# Patient Record
Sex: Male | Born: 1975
Health system: Southern US, Community
[De-identification: ages and names within clinical notes are randomized; demographics above are authoritative.]

## PROBLEM LIST (undated history)

## (undated) ENCOUNTER — Ambulatory Visit (HOSPITAL_COMMUNITY): Admission: EM | Payer: Commercial Managed Care - HMO | Source: Home / Self Care

## (undated) DIAGNOSIS — R7303 Prediabetes: Secondary | ICD-10-CM

## (undated) DIAGNOSIS — I10 Essential (primary) hypertension: Secondary | ICD-10-CM

## (undated) DIAGNOSIS — R079 Chest pain, unspecified: Secondary | ICD-10-CM

## (undated) HISTORY — DX: Prediabetes: R73.03

## (undated) HISTORY — DX: Chest pain, unspecified: R07.9

## (undated) HISTORY — PX: NO PAST SURGERIES: SHX2092

---

## 2012-06-30 ENCOUNTER — Emergency Department (HOSPITAL_COMMUNITY)
Admission: EM | Admit: 2012-06-30 | Discharge: 2012-06-30 | Disposition: A | Discharge status: Home / Self Care | Payer: No Typology Code available for payment source | Attending: Emergency Medicine | Admitting: Emergency Medicine

## 2012-06-30 ENCOUNTER — Emergency Department (HOSPITAL_COMMUNITY): Payer: No Typology Code available for payment source

## 2012-06-30 ENCOUNTER — Encounter (HOSPITAL_COMMUNITY): Payer: Self-pay | Admitting: *Deleted

## 2012-06-30 DIAGNOSIS — M545 Low back pain, unspecified: Secondary | ICD-10-CM | POA: Insufficient documentation

## 2012-06-30 DIAGNOSIS — S39012A Strain of muscle, fascia and tendon of lower back, initial encounter: Secondary | ICD-10-CM

## 2012-06-30 DIAGNOSIS — M542 Cervicalgia: Secondary | ICD-10-CM | POA: Insufficient documentation

## 2012-06-30 DIAGNOSIS — S161XXA Strain of muscle, fascia and tendon at neck level, initial encounter: Secondary | ICD-10-CM

## 2012-06-30 MED ORDER — IBUPROFEN 800 MG PO TABS: 800.0000 mg | ORAL_TABLET | Freq: Three times a day (TID) | ORAL | Status: AC

## 2012-06-30 MED ORDER — IBUPROFEN 800 MG PO TABS
800.0000 mg | ORAL_TABLET | Freq: Once | ORAL | Status: AC
  Administered 2012-06-30: 800 mg via ORAL
  Filled 2012-06-30: qty 1

## 2012-06-30 MED ORDER — HYDROCODONE-ACETAMINOPHEN 5-325 MG PO TABS: 1.0000 | ORAL_TABLET | Freq: Four times a day (QID) | ORAL | Status: DC | PRN

## 2012-06-30 MED ORDER — HYDROCODONE-ACETAMINOPHEN 5-325 MG PO TABS: 1.0000 | ORAL_TABLET | ORAL | Status: AC | PRN

## 2012-06-30 MED ORDER — IBUPROFEN 800 MG PO TABS: 800.0000 mg | ORAL_TABLET | Freq: Three times a day (TID) | ORAL | Status: DC | PRN

## 2012-06-30 NOTE — ED Notes (Signed)
Patient returned from X-ray, resting comfortably, NAD noted.
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
## 2012-07-01 NOTE — ED Provider Notes (Signed)
Medical screening examination/treatment/procedure(s) were performed by non-physician practitioner and as supervising physician I was immediately available for consultation/collaboration.  Armanda Forand T Doniesha Landau, MD 07/01/12 0755 

## 2013-06-03 ENCOUNTER — Emergency Department (HOSPITAL_COMMUNITY)
Admission: EM | Admit: 2013-06-03 | Discharge: 2013-06-03 | Disposition: A | Discharge status: Home / Self Care | Payer: Self-pay | Attending: Emergency Medicine | Admitting: Emergency Medicine

## 2013-06-03 ENCOUNTER — Encounter (HOSPITAL_COMMUNITY): Payer: Self-pay | Admitting: *Deleted

## 2013-06-03 ENCOUNTER — Emergency Department (HOSPITAL_COMMUNITY): Payer: Self-pay

## 2013-06-03 DIAGNOSIS — F17200 Nicotine dependence, unspecified, uncomplicated: Secondary | ICD-10-CM | POA: Insufficient documentation

## 2013-06-03 DIAGNOSIS — Z79899 Other long term (current) drug therapy: Secondary | ICD-10-CM | POA: Insufficient documentation

## 2013-06-03 DIAGNOSIS — M2550 Pain in unspecified joint: Secondary | ICD-10-CM | POA: Insufficient documentation

## 2013-06-03 DIAGNOSIS — M25511 Pain in right shoulder: Secondary | ICD-10-CM

## 2013-06-03 DIAGNOSIS — M25519 Pain in unspecified shoulder: Secondary | ICD-10-CM | POA: Insufficient documentation

## 2013-06-03 MED ORDER — CYCLOBENZAPRINE HCL 10 MG PO TABS: 10.0000 mg | ORAL_TABLET | Freq: Two times a day (BID) | ORAL | Status: DC | PRN

## 2013-06-03 MED ORDER — KETOROLAC TROMETHAMINE 60 MG/2ML IM SOLN
60.0000 mg | Freq: Once | INTRAMUSCULAR | Status: AC
  Administered 2013-06-03: 60 mg via INTRAMUSCULAR
  Filled 2013-06-03: qty 2

## 2013-06-03 MED ORDER — CYCLOBENZAPRINE HCL 10 MG PO TABS
10.0000 mg | ORAL_TABLET | Freq: Once | ORAL | Status: AC
  Administered 2013-06-03: 10 mg via ORAL
  Filled 2013-06-03: qty 1

## 2013-06-03 MED ORDER — NAPROXEN 500 MG PO TABS: 500.0000 mg | ORAL_TABLET | Freq: Two times a day (BID) | ORAL | Status: DC

## 2013-06-03 NOTE — ED Notes (Signed)
Pt reports right shoulder pain x2-3 weeks. States his job requires him to use shoulder muscles daily, but denies specific injury. Pain got progressively worse, states he "could not take the pain anymore". Pt does not apper in any acute distress. Rates pain 10/10. Pain "sharp", radiates to right elbow.

## 2013-06-03 NOTE — ED Provider Notes (Signed)
   History    CSN: [id number] Arrival date & time 06/03/13  0704  First MD Initiated Contact with Patient 06/03/13 [phone]     Chief Complaint  Patient presents with  . Shoulder Pain   (Consider location/radiation/quality/duration/timing/severity/associated sxs/prior Treatment) HPI Comments: Patient is a 37 year old male who presents with a 3 week history of right shoulder pain. Symptoms started gradually and progressively worsened since the onset. The pain is aching and severe without radiation. Patient has tried applying heat to the affected area without relief. 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 The patient is right-handed. Patient denies any injury. Movement and palpation make the pain worse. Nothing makes the pain better.   History reviewed. No pertinent past medical history. History reviewed. No pertinent past surgical history. No family history on file. History  Substance Use Topics  . Smoking status: Current Some Day Smoker  . Smokeless tobacco: Not on file  . Alcohol Use: No    Review of Systems  Musculoskeletal: Positive for arthralgias.  All other systems reviewed and are negative.    Allergies  Review of patient's allergies indicates no known allergies.  Home Medications   Current Outpatient Rx  Name  Route  Sig  Dispense  Refill  . Multiple Vitamin (MULTIVITAMIN WITH MINERALS) TABS   Oral   Take 1 tablet by mouth daily.          BP 123/86  Pulse 71  Temp(Src) 97.4 F (36.3 C) (Oral)  Resp 16  SpO2 99% Physical Exam  Nursing note and vitals reviewed. Constitutional: He is oriented to person, place, and time. He appears well-developed and well-nourished. No distress.  HENT:  Head: Normocephalic and atraumatic.  Eyes: Conjunctivae are normal.  Neck: Normal range of motion.  Cardiovascular: Normal rate and regular rhythm.  Exam reveals no gallop and no friction rub.   No murmur heard. Pulmonary/Chest: Effort  normal and breath sounds normal. He has no wheezes. He has no rales. He exhibits no tenderness.  Musculoskeletal:  Right shoulder ROM slightly limited due to pain. Scapular tenderness to palpation. No obvious deformity.  Neurological: He is alert and oriented to person, place, and time. Coordination normal.  Upper extremity strength and sensation equal and intact bilaterally. Speech is goal-oriented. Moves limbs without ataxia.   Skin: Skin is warm and dry.  Psychiatric: He has a normal mood and affect. His behavior is normal.    ED Course  Procedures (including critical care time) Labs Reviewed - No data to display Dg Shoulder Right  06/03/2013   *RADIOLOGY REPORT*  Clinical Data: Shoulder pain.  RIGHT SHOULDER - 2+ VIEW  Comparison: No priors.  Findings: Three views of the right shoulder demonstrate no acute displaced fracture, subluxation, dislocation, joint or soft tissue abnormality.  IMPRESSION: 1.  No acute radiographic abnormality of the right shoulder.   Original Report Authenticated By: Trudie Reed, M.D.   1. Right shoulder pain     MDM  7:51 AM Xray of right shoulder pending. Patient will have toradol and flexeril for pain.   8:47 AM Xray unremarkable. Patient reports relief after toradol and flexeril. Patient will be discharged with Naprosyn and Flexeril and instructions to apply heat to affected area. Patient likely experiencing muscle pain from overuse at work. No neurovascular compromise or injury.   Emilia Beck, PA-C 06/03/13 0854  Emilia Beck, PA-C 06/03/13 [phone]

## 2013-06-03 NOTE — ED Provider Notes (Signed)
Medical screening examination/treatment/procedure(s) were performed by non-physician practitioner and as supervising physician I was immediately available for consultation/collaboration.  Toy Baker, MD 06/03/13 1118

## 2013-06-06 ENCOUNTER — Emergency Department (HOSPITAL_COMMUNITY)
Admission: EM | Admit: 2013-06-06 | Discharge: 2013-06-06 | Disposition: A | Discharge status: Home / Self Care | Payer: Worker's Compensation | Attending: Emergency Medicine | Admitting: Emergency Medicine

## 2013-06-06 ENCOUNTER — Encounter (HOSPITAL_COMMUNITY): Payer: Self-pay | Admitting: Emergency Medicine

## 2013-06-06 DIAGNOSIS — S46909A Unspecified injury of unspecified muscle, fascia and tendon at shoulder and upper arm level, unspecified arm, initial encounter: Secondary | ICD-10-CM | POA: Insufficient documentation

## 2013-06-06 DIAGNOSIS — Z79899 Other long term (current) drug therapy: Secondary | ICD-10-CM | POA: Insufficient documentation

## 2013-06-06 DIAGNOSIS — Y929 Unspecified place or not applicable: Secondary | ICD-10-CM | POA: Insufficient documentation

## 2013-06-06 DIAGNOSIS — Y9389 Activity, other specified: Secondary | ICD-10-CM | POA: Insufficient documentation

## 2013-06-06 DIAGNOSIS — S4980XA Other specified injuries of shoulder and upper arm, unspecified arm, initial encounter: Secondary | ICD-10-CM | POA: Insufficient documentation

## 2013-06-06 DIAGNOSIS — X500XXA Overexertion from strenuous movement or load, initial encounter: Secondary | ICD-10-CM | POA: Insufficient documentation

## 2013-06-06 DIAGNOSIS — M25512 Pain in left shoulder: Secondary | ICD-10-CM

## 2013-06-06 DIAGNOSIS — F17200 Nicotine dependence, unspecified, uncomplicated: Secondary | ICD-10-CM | POA: Insufficient documentation

## 2013-06-06 DIAGNOSIS — Z791 Long term (current) use of non-steroidal anti-inflammatories (NSAID): Secondary | ICD-10-CM | POA: Insufficient documentation

## 2013-06-06 MED ORDER — MELOXICAM 15 MG PO TABS: 15.0000 mg | ORAL_TABLET | Freq: Every day | ORAL | Status: DC

## 2013-06-06 MED ORDER — HYDROCODONE-ACETAMINOPHEN 5-325 MG PO TABS: 1.0000 | ORAL_TABLET | Freq: Four times a day (QID) | ORAL | Status: DC | PRN

## 2013-06-06 NOTE — ED Provider Notes (Signed)
History    CSN: [id number] Arrival date & time 06/06/13  1133  First MD Initiated Contact with Patient 06/06/13 1137     Chief Complaint  Patient presents with  . Shoulder Injury    strained r/shoulder at work one week ago   (Consider location/radiation/quality/duration/timing/severity/associated sxs/prior Treatment) Patient is a 37 y.o. male presenting with shoulder pain.  Shoulder Pain This is a new problem. The current episode started in the past 7 days. The problem occurs constantly. The problem has been gradually worsening. Pertinent negatives include no abdominal pain, anorexia, arthralgias, change in bowel habit, chest pain, chills, congestion, coughing, diaphoresis, fatigue, fever, headaches, joint swelling, myalgias, nausea, neck pain, numbness, rash, sore throat, swollen glands, urinary symptoms, vertigo, visual change, vomiting or weakness. Exacerbated by: moving the joints. He has tried NSAIDs for the symptoms. The treatment provided no relief.    Trevor Wallace is a 37 y.o. male who sustained a right shoulder injury 3 day(s) ago. Mechanism of injury: moving heavy boxes. Immediate symptoms: delayed pain. Symptoms have been worsening since that time. Prior history of related problems: no prior problems with this area in the past. Patient seen here 3 days ago for same. Patient states that the pain has been waking him from sleep. He is unable to raise his arms above his head. Patient unable to follow up with [facility]. He states that he has left several messages with the worker's Government social research officer and has had no return calls.  .  History reviewed. No pertinent past medical history. History reviewed. No pertinent past surgical history. Family History  Problem Relation Age of Onset  . Diabetes Other    History  Substance Use Topics  . Smoking status: Current Some Day Smoker  . Smokeless tobacco: Not on file  . Alcohol Use: No    Review of Systems  Constitutional:  Negative for fever, chills, diaphoresis and fatigue.  HENT: Negative for congestion, sore throat and neck pain.   Respiratory: Negative for cough.   Cardiovascular: Negative for chest pain.  Gastrointestinal: Negative for nausea, vomiting, abdominal pain, anorexia and change in bowel habit.  Musculoskeletal: Negative for myalgias, joint swelling and arthralgias.  Skin: Negative for rash.  Neurological: Negative for vertigo, weakness, numbness and headaches.   Allergies  Review of patient's allergies indicates no known allergies.  Home Medications   Current Outpatient Rx  Name  Route  Sig  Dispense  Refill  . cyclobenzaprine (FLEXERIL) 10 MG tablet   Oral   Take 1 tablet (10 mg total) by mouth 2 (two) times daily as needed for muscle spasms.   20 tablet   0   . Multiple Vitamin (MULTIVITAMIN WITH MINERALS) TABS   Oral   Take 1 tablet by mouth daily.         . naproxen (NAPROSYN) 500 MG tablet   Oral   Take 1 tablet (500 mg total) by mouth 2 (two) times daily with a meal.   30 tablet   0    BP 118/76  Pulse 92  Temp(Src) 98.1 F (36.7 C) (Oral)  Resp 22  SpO2 99% Physical Exam Physical Exam  Nursing note and vitals reviewed. Constitutional: He appears well-developed and well-nourished. No distress.  HENT:  Head: Normocephalic and atraumatic.  Eyes: Conjunctivae normal are normal. No scleral icterus.  Neck: Normal range of motion. Neck supple.  Cardiovascular: Normal rate, regular rhythm and normal heart sounds.   Pulmonary/Chest: Effort normal and breath sounds normal. No respiratory distress.  Abdominal: Soft. There is no tenderness.  Musculoskeletal: He exhibits no edema.  A right shoulder exam was performed. SKIN: intact SWELLING: none DEFORMITY: none TENDERNESS: moderate ROM: limited by pain STRENGTH: limited by pain SPECIAL TESTS: Cross-chest abduction: negative and Yergason's test: negative NEUROLOGICAL EXAM: normal VASCULAR EXAM: normal NECK:  supple Neurological: He is alert.  Skin: Skin is warm and dry. He is not diaphoretic.  Psychiatric: His behavior is normal.    ED Course  Procedures (including critical care time) Labs Reviewed - No data to display No results found. 1. Shoulder pain, acute, left     MDM  Patient with shoulder pain that  Appears consistent with rotator cuff injury. i have placed him in a sling. F/u with [facility]. Increased his pain meds. The patient appears reasonably screened and/or stabilized for discharge and I doubt any other medical condition or other [facility] requiring further screening, evaluation, or treatment in the ED at this time prior to discharge.   Arthor Captain, PA-C 06/06/13 2105

## 2013-06-06 NOTE — ED Notes (Signed)
increased r/shoulder pain x 1 week. Work related strain

## 2013-06-11 NOTE — ED Provider Notes (Signed)
Medical screening examination/treatment/procedure(s) were performed by non-physician practitioner and as supervising physician I was immediately available for consultation/collaboration.   Loren Racer, MD 06/11/13 1544

## 2013-07-01 ENCOUNTER — Emergency Department (HOSPITAL_COMMUNITY)
Admission: EM | Admit: 2013-07-01 | Discharge: 2013-07-01 | Disposition: A | Discharge status: Home / Self Care | Payer: Worker's Compensation | Attending: Emergency Medicine | Admitting: Emergency Medicine

## 2013-07-01 ENCOUNTER — Encounter (HOSPITAL_COMMUNITY): Payer: Self-pay | Admitting: Emergency Medicine

## 2013-07-01 DIAGNOSIS — M25511 Pain in right shoulder: Secondary | ICD-10-CM

## 2013-07-01 DIAGNOSIS — F17200 Nicotine dependence, unspecified, uncomplicated: Secondary | ICD-10-CM | POA: Insufficient documentation

## 2013-07-01 DIAGNOSIS — M25519 Pain in unspecified shoulder: Secondary | ICD-10-CM | POA: Insufficient documentation

## 2013-07-01 DIAGNOSIS — Z87828 Personal history of other (healed) physical injury and trauma: Secondary | ICD-10-CM | POA: Insufficient documentation

## 2013-07-01 DIAGNOSIS — M2559 Pain in other specified joint: Secondary | ICD-10-CM | POA: Insufficient documentation

## 2013-07-01 DIAGNOSIS — R209 Unspecified disturbances of skin sensation: Secondary | ICD-10-CM | POA: Insufficient documentation

## 2013-07-01 DIAGNOSIS — Z79899 Other long term (current) drug therapy: Secondary | ICD-10-CM | POA: Insufficient documentation

## 2013-07-01 MED ORDER — OXYCODONE-ACETAMINOPHEN 5-325 MG PO TABS
2.0000 | ORAL_TABLET | Freq: Once | ORAL | Status: AC
  Administered 2013-07-01: 2 via ORAL
  Filled 2013-07-01: qty 2

## 2013-07-01 MED ORDER — OXYCODONE-ACETAMINOPHEN 5-325 MG PO TABS: 2.0000 | ORAL_TABLET | Freq: Four times a day (QID) | ORAL | Status: DC | PRN

## 2013-07-01 MED ORDER — PROMETHAZINE HCL 25 MG PO TABS: 25.0000 mg | ORAL_TABLET | Freq: Four times a day (QID) | ORAL | Status: DC | PRN

## 2013-07-01 MED ORDER — ONDANSETRON 8 MG PO TBDP
8.0000 mg | ORAL_TABLET | Freq: Once | ORAL | Status: AC
  Administered 2013-07-01: 8 mg via ORAL
  Filled 2013-07-01: qty 1
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
## 2013-07-01 NOTE — ED Provider Notes (Signed)
CSN: [id number]     Arrival date & time 07/01/13  1257 History  This chart was scribed for non-physician practitioner, Junious Silk, PA-C working with Celene Kras, MD by Greggory Stallion, ED scribe. This patient was seen in room WTR7/WTR7 and the patient's care was started at 1:22 PM.   Chief Complaint  Patient presents with  . Shoulder Pain   The history is provided by the patient. No language interpreter was used.   HPI Comments: Trevor Wallace is a 37 y.o. male who presents to the Emergency Department complaining of gradual onset, gradually worsening throbbing, sharp right shoulder pain that started 3 weeks ago. He states he had a prior injury to his right shoulder due to a work related accident and has been here several times for his shoulder pain. Pt's right fingers have been numb since the accident occurred. He states he is out of his pain medications. Pt has tried the exercises he was given with no relief. He was also told an appointment would be made at an orthopaedist but he never heard anything back about it. Pt denies any new injuries to his shoulder.   History reviewed. No pertinent past medical history. History reviewed. No pertinent past surgical history. Family History  Problem Relation Age of Onset  . Diabetes Other    History  Substance Use Topics  . Smoking status: Current Some Day Smoker  . Smokeless tobacco: Not on file  . Alcohol Use: No    Review of Systems  Musculoskeletal: Positive for arthralgias.  Neurological: Positive for numbness.  All other systems reviewed and are negative.    Allergies  Review of patient's allergies indicates no known allergies.  Home Medications   Current Outpatient Rx  Name  Route  Sig  Dispense  Refill  . HYDROcodone-acetaminophen (NORCO) 5-325 MG per tablet   Oral   Take 1-2 tablets by mouth every 6 (six) hours as needed for pain.   20 tablet   0   . meloxicam (MOBIC) 15 MG tablet   Oral   Take 1 tablet (15 mg  total) by mouth daily. Take 1 daily with food.   10 tablet   0   . Multiple Vitamin (MULTIVITAMIN WITH MINERALS) TABS   Oral   Take 1 tablet by mouth daily.          BP 160/75  Pulse 80  Temp(Src) 98.9 F (37.2 C) (Oral)  Resp 18  SpO2 100%  Physical Exam  Nursing note and vitals reviewed. Constitutional: He is oriented to person, place, and time. He appears well-developed and well-nourished. No distress.  HENT:  Head: Normocephalic and atraumatic.  Right Ear: External ear normal.  Left Ear: External ear normal.  Nose: Nose normal.  Eyes: Conjunctivae are normal.  Neck: Normal range of motion. No tracheal deviation present.  Cardiovascular: Normal rate, regular rhythm and normal heart sounds.   Pulmonary/Chest: Effort normal and breath sounds normal. No stridor.  Abdominal: Soft. He exhibits no distension. There is no tenderness.  Musculoskeletal: Normal range of motion.  Tender to palpation over right shoulder. Empty can test causes pain but strength normal in shoulder. Neurovascularly intact. Compartments soft.   Neurological: He is alert and oriented to person, place, and time.  Skin: Skin is warm and dry. He is not diaphoretic.  Psychiatric: He has a normal mood and affect. His behavior is normal.    ED Course   Procedures (including critical care time)  DIAGNOSTIC STUDIES: Oxygen Saturation is 100%  on RA, normal by my interpretation.    COORDINATION OF CARE: 2:44 PM-Discussed treatment plan which includes pain medication in the ED and doing the shoulder exercises he was given before with pt at bedside and pt agreed to plan. Advised pt he needs to go to an orthopaedist to deal with pain management.   Labs Reviewed - No data to display No results found. 1. Right shoulder pain     MDM  Patient with right shoulder pain. This is the patient's 3rd visit for the same. XR are negative for acute pathology at prior visits. No new injuries. Neurovascularly intact.  Strength is normal in his right arm. Compartment soft. I have encouraged patient to follow up with ortho and gave him the name and number of orthopedist on call. I have also given him stretching exercises to do. Small course of pain meds. Return instructions given. Vital signs stable for discharge. Patient / Family / Caregiver informed of clinical course, understand medical decision-making process, and agree with plan.      I personally performed the services described in this documentation, which was scribed in my presence. The recorded information has been reviewed and is accurate.    Mora Bellman, PA-C 07/02/13 1109

## 2013-07-02 NOTE — ED Provider Notes (Signed)
Medical screening examination/treatment/procedure(s) were performed by non-physician practitioner and as supervising physician I was immediately available for consultation/collaboration.    Lareta Bruneau R Mariel Gaudin, MD 07/02/13 1151 

## 2013-08-15 ENCOUNTER — Emergency Department (HOSPITAL_COMMUNITY)
Admission: EM | Admit: 2013-08-15 | Discharge: 2013-08-15 | Disposition: A | Discharge status: Home / Self Care | Payer: Self-pay | Source: Home / Self Care

## 2013-08-17 ENCOUNTER — Encounter (HOSPITAL_COMMUNITY): Payer: Self-pay | Admitting: *Deleted

## 2013-08-17 ENCOUNTER — Emergency Department (HOSPITAL_COMMUNITY)
Admission: EM | Admit: 2013-08-17 | Discharge: 2013-08-17 | Disposition: A | Discharge status: Home / Self Care | Payer: Self-pay | Attending: Emergency Medicine | Admitting: Emergency Medicine

## 2013-08-17 DIAGNOSIS — Z79899 Other long term (current) drug therapy: Secondary | ICD-10-CM | POA: Insufficient documentation

## 2013-08-17 DIAGNOSIS — A059 Bacterial foodborne intoxication, unspecified: Secondary | ICD-10-CM | POA: Insufficient documentation

## 2013-08-17 DIAGNOSIS — F17200 Nicotine dependence, unspecified, uncomplicated: Secondary | ICD-10-CM | POA: Insufficient documentation

## 2013-08-17 LAB — CBC WITH DIFFERENTIAL/PLATELET
Basophils Relative: 0 % (ref 0–1)
Eosinophils Absolute: 0.1 10*3/uL (ref 0.0–0.7)
Lymphocytes Relative: 33 % (ref 12–46)
MCH: 21.7 pg — ABNORMAL LOW (ref 26.0–34.0)
MCV: 68.2 fL — ABNORMAL LOW (ref 78.0–100.0)
Neutro Abs: 4.6 10*3/uL (ref 1.7–7.7)
Neutrophils Relative %: 55 % (ref 43–77)
Platelets: 280 10*3/uL (ref 150–400)
RBC: 5.75 MIL/uL (ref 4.22–5.81)
RDW: 16.7 % — ABNORMAL HIGH (ref 11.5–15.5)
WBC: 8.4 10*3/uL (ref 4.0–10.5)

## 2013-08-17 LAB — COMPREHENSIVE METABOLIC PANEL
Alkaline Phosphatase: 78 U/L (ref 39–117)
BUN: 17 mg/dL (ref 6–23)
Chloride: 104 mEq/L (ref 96–112)
GFR calc Af Amer: >90 mL/min (ref >90)
GFR calc non Af Amer: >90 mL/min (ref >90)
Glucose, Bld: 117 mg/dL — ABNORMAL HIGH (ref 70–99)
Potassium: 3.5 mEq/L (ref 3.5–5.1)
Total Bilirubin: 0.3 mg/dL (ref 0.3–1.2)
Total Protein: 7.4 g/dL (ref 6.0–8.3)

## 2013-08-17 MED ORDER — ONDANSETRON 4 MG PO TBDP: ORAL_TABLET | ORAL | Status: DC

## 2013-08-17 MED ORDER — SODIUM CHLORIDE 0.9 % IV BOLUS (SEPSIS)
1000.0000 mL | Freq: Once | INTRAVENOUS | Status: AC
  Administered 2013-08-17: 1000 mL via INTRAVENOUS

## 2013-08-17 MED ORDER — ONDANSETRON HCL 4 MG/2ML IJ SOLN
4.0000 mg | Freq: Once | INTRAMUSCULAR | Status: AC
  Administered 2013-08-17: 4 mg via INTRAVENOUS
  Filled 2013-08-17: qty 2

## 2013-08-17 MED ORDER — DICYCLOMINE HCL 20 MG PO TABS: 20.0000 mg | ORAL_TABLET | Freq: Four times a day (QID) | ORAL | Status: DC

## 2013-08-17 NOTE — ED Provider Notes (Signed)
CSN: [id number]     Arrival date & time 08/17/13  0149 History   First MD Initiated Contact with Patient 08/17/13 0203     Chief Complaint  Patient presents with  . Emesis   (Consider location/radiation/quality/duration/timing/severity/associated sxs/prior Treatment) HPI Patient states he ate undercooked chicken at 11 PM. He then started having abdominal cramping, nausea, vomiting and diarrhea at 11:30 PM. He states he has had multiple episodes of both the vomiting and diarrhea. Patient denies blood in the vomit or in the stool. Patient denies fevers or chills. History reviewed. No pertinent past medical history. History reviewed. No pertinent past surgical history. Family History  Problem Relation Age of Onset  . Diabetes Other    History  Substance Use Topics  . Smoking status: Current Some Day Smoker  . Smokeless tobacco: Not on file  . Alcohol Use: No    Review of Systems  Constitutional: Negative for fever and chills.  Gastrointestinal: Positive for nausea, vomiting, abdominal pain and diarrhea.  Genitourinary: Negative for dysuria.  Musculoskeletal: Negative for myalgias and back pain.  Skin: Negative for rash and wound.  Neurological: Negative for dizziness, weakness, light-headedness, numbness and headaches.  All other systems reviewed and are negative.    Allergies  Review of patient's allergies indicates no known allergies.  Home Medications   Current Outpatient Rx  Name  Route  Sig  Dispense  Refill  . Multiple Vitamin (MULTIVITAMIN WITH MINERALS) TABS   Oral   Take 1 tablet by mouth daily.         Marland Kitchen dicyclomine (BENTYL) 20 MG tablet   Oral   Take 1 tablet (20 mg total) by mouth every 6 (six) hours.   20 tablet   0   . ondansetron (ZOFRAN ODT) 4 MG disintegrating tablet      4mg  ODT q4 hours prn nausea/vomit   8 tablet   0    BP 103/69  Pulse 71  Temp(Src) 97.6 F (36.4 C)  Resp 20  Ht 5\' 8"  (1.727 m)  Wt 201 lb (91.173 kg)  BMI 30.57  kg/m2  SpO2 97% Physical Exam  Nursing note and vitals reviewed. Constitutional: He is oriented to person, place, and time. He appears well-developed and well-nourished. No distress.  HENT:  Head: Normocephalic and atraumatic.  Mouth/Throat: Oropharynx is clear and moist.  Eyes: EOM are normal. Pupils are equal, round, and reactive to light.  Neck: Normal range of motion. Neck supple.  Cardiovascular: Normal rate and regular rhythm.   Pulmonary/Chest: Effort normal and breath sounds normal. No respiratory distress. He has no wheezes. He has no rales.  Abdominal: Soft. Bowel sounds are normal. He exhibits no distension and no mass. There is tenderness (mild diffuse abdominal tenderness without rebound or guarding). There is no rebound and no guarding.  Musculoskeletal: Normal range of motion. He exhibits no edema and no tenderness.  Neurological: He is alert and oriented to person, place, and time.  Skin: Skin is warm and dry. No rash noted. No erythema.  Psychiatric: He has a normal mood and affect. His behavior is normal.    ED Course  Procedures (including critical care time) Labs Review Labs Reviewed  CBC WITH DIFFERENTIAL - Abnormal; Notable for the following:    Hemoglobin 12.5 (*)    MCV 68.2 (*)    MCH 21.7 (*)    RDW 16.7 (*)    All other components within normal limits  COMPREHENSIVE METABOLIC PANEL - Abnormal; Notable for the following:  Glucose, Bld 117 (*)    All other components within normal limits   Imaging Review No results found.  MDM   1. Food poisoning     Patient hasn't had no further vomiting in the emergency department. He is resting comfortably. His abdomen is soft and nontender. We'll discharge home with symptomatic control. Return precautions have been given.   Loren Racer, MD 08/17/13 [phone]

## 2013-08-17 NOTE — ED Notes (Signed)
The pt reports that he ate some raw chicken approc 2300 vomitng and diarrhea since then

## 2014-02-24 ENCOUNTER — Encounter (HOSPITAL_COMMUNITY): Payer: Self-pay | Admitting: Emergency Medicine

## 2014-02-24 ENCOUNTER — Emergency Department (HOSPITAL_COMMUNITY): Payer: Self-pay

## 2014-02-24 ENCOUNTER — Emergency Department (HOSPITAL_COMMUNITY)
Admission: EM | Admit: 2014-02-24 | Discharge: 2014-02-25 | Disposition: A | Discharge status: Home / Self Care | Payer: Self-pay | Attending: Emergency Medicine | Admitting: Emergency Medicine

## 2014-02-24 DIAGNOSIS — S6390XA Sprain of unspecified part of unspecified wrist and hand, initial encounter: Secondary | ICD-10-CM | POA: Insufficient documentation

## 2014-02-24 DIAGNOSIS — X58XXXA Exposure to other specified factors, initial encounter: Secondary | ICD-10-CM | POA: Insufficient documentation

## 2014-02-24 DIAGNOSIS — S63602A Unspecified sprain of left thumb, initial encounter: Secondary | ICD-10-CM

## 2014-02-24 DIAGNOSIS — J029 Acute pharyngitis, unspecified: Secondary | ICD-10-CM | POA: Insufficient documentation

## 2014-02-24 DIAGNOSIS — Y929 Unspecified place or not applicable: Secondary | ICD-10-CM | POA: Insufficient documentation

## 2014-02-24 DIAGNOSIS — H9201 Otalgia, right ear: Secondary | ICD-10-CM

## 2014-02-24 DIAGNOSIS — H9209 Otalgia, unspecified ear: Secondary | ICD-10-CM | POA: Insufficient documentation

## 2014-02-24 DIAGNOSIS — Y939 Activity, unspecified: Secondary | ICD-10-CM | POA: Insufficient documentation

## 2014-02-24 DIAGNOSIS — F17200 Nicotine dependence, unspecified, uncomplicated: Secondary | ICD-10-CM | POA: Insufficient documentation

## 2014-02-24 NOTE — ED Notes (Signed)
Pt states right ear pain for one week and soreness in his left thumb with unknown trauma

## 2014-02-25 NOTE — ED Provider Notes (Signed)
Medical screening examination/treatment/procedure(s) were performed by non-physician practitioner and as supervising physician I was immediately available for consultation/collaboration.   EKG Interpretation None        Loren Raceravid Zenola Dezarn, MD 02/25/14 [phone]

## 2014-02-25 NOTE — ED Provider Notes (Signed)
CSN: [id number]     Arrival date & time 02/24/14  2235 History   First MD Initiated Contact with Patient 02/24/14 2335     Chief Complaint  Patient presents with  . Otalgia  . Finger Injury     (Consider location/radiation/quality/duration/timing/severity/associated sxs/prior Treatment) HPI History provided by pt.   Pt presents w/ right sided ear pain x ~1 week.  Associated w/ mild nasal congestion, sore throat and cough.  Denies fever and sinus pressure.  Denies trauma.  Also c/o pain in L thumb for the past 3-4 days.  Thumb seems to dislocate and he has to manually push it back into place.  Constant pain but more severe when it moves out of joint.  This occurs randomly.  No associated paresthesias.  No known trauma. Does not have this problem with any other joint.   History reviewed. No pertinent past medical history. History reviewed. No pertinent past surgical history. Family History  Problem Relation Age of Onset  . Diabetes Other    History  Substance Use Topics  . Smoking status: Current Some Day Smoker    Types: Cigarettes  . Smokeless tobacco: Not on file  . Alcohol Use: No    Review of Systems  All other systems reviewed and are negative.     Allergies  Review of patient's allergies indicates no known allergies.  Home Medications  No current outpatient prescriptions on file. BP 129/88  Pulse 86  Temp(Src) 97.7 F (36.5 C) (Oral)  Resp 16  Ht 5\' 11"  (1.803 m)  Wt 229 lb (103.874 kg)  BMI 31.95 kg/m2  SpO2 97% Physical Exam  Nursing note and vitals reviewed. Constitutional: He is oriented to person, place, and time. He appears well-developed and well-nourished. No distress.  HENT:  Head: Normocephalic and atraumatic.  L/R TM and EAC unremarkable.  No Sinus ttp.  Posterior pharynx w/out erythema and there is no tonsillar edema or exudate.  Uvula mid-line.  No trismus.    Eyes:  Normal appearance  Neck: Normal range of motion.  Cardiovascular: Normal  rate and regular rhythm.   Pulmonary/Chest: Effort normal and breath sounds normal. No respiratory distress.  Musculoskeletal: Normal range of motion.  L thumb w/out dislocation, edema, skin changes.  Tenderness MCP joint and pain w/ passive flexion.  No scaphoid ttp.  Nml wrist.  Brisk cap refill and distal sensation intact.      Lymphadenopathy:    He has no cervical adenopathy.  Neurological: He is alert and oriented to person, place, and time.  Skin: Skin is warm and dry. No rash noted.  Psychiatric: He has a normal mood and affect. His behavior is normal.    ED Course  Procedures (including critical care time) Labs Review Labs Reviewed - No data to display Imaging Review Dg Finger Thumb Left  02/24/2014   CLINICAL DATA:  First MCP pain  EXAM: LEFT THUMB 2+V  COMPARISON:  None.  FINDINGS: There is no evidence of fracture or dislocation. There is no evidence of arthropathy or other focal bone abnormality. Soft tissues are unremarkable  IMPRESSION: Negative.   Electronically Signed   By: Elige KoHetal  Patel   On: 02/24/2014 23:49     EKG Interpretation None      MDM   Final diagnoses:  Sprain of left thumb  Otalgia of right ear    37yo healthy M presents w/ multiple complaints.  Has had non-traumatic R ear pain x 1 week w/ associated congestion, sore throat and cough.  Unremarkable ENT on exam.  Recommended that he try sudafed for pain.  Also c/o L thumb pain and what sounds like intermittent MCP joint dislocation.   No significant exam findings and xray neg.  Ortho tech placed in velcrow thumb spica and I recommended rest/ice/NSAID.  Referred to Hand for persistent/worsening sx.      Otilio Miuatherine E Vidyuth Belsito, PA-C 02/25/14 0207  Otilio Miuatherine E Mase Dhondt, PA-C 02/25/14 [phone]

## 2014-02-25 NOTE — Discharge Instructions (Signed)
Rest thumb as much as possible and apply ice 2-3 times a day for 15-20 minutes.  Follow up with the hand surgeon you have been referred to if the pain does not start to improve in 1 week.  Take tylenol or ibuprofen as needed for pain in right ear.  You should also try sudafed as this may improve congestion and indirectly, your cough and sore throat as well.   You may return to the ER if symptoms worsen or you have any other concerns.

## 2015-12-07 ENCOUNTER — Emergency Department (HOSPITAL_COMMUNITY)
Admission: EM | Admit: 2015-12-07 | Discharge: 2015-12-08 | Disposition: A | Discharge status: Home / Self Care | Payer: No Typology Code available for payment source | Attending: Emergency Medicine | Admitting: Emergency Medicine

## 2015-12-07 ENCOUNTER — Encounter (HOSPITAL_COMMUNITY): Payer: Self-pay | Admitting: Emergency Medicine

## 2015-12-07 DIAGNOSIS — Z043 Encounter for examination and observation following other accident: Secondary | ICD-10-CM

## 2015-12-07 DIAGNOSIS — Y92410 Unspecified street and highway as the place of occurrence of the external cause: Secondary | ICD-10-CM | POA: Diagnosis not present

## 2015-12-07 DIAGNOSIS — S0990XA Unspecified injury of head, initial encounter: Secondary | ICD-10-CM | POA: Insufficient documentation

## 2015-12-07 DIAGNOSIS — F17210 Nicotine dependence, cigarettes, uncomplicated: Secondary | ICD-10-CM | POA: Insufficient documentation

## 2015-12-07 DIAGNOSIS — S3992XA Unspecified injury of lower back, initial encounter: Secondary | ICD-10-CM | POA: Insufficient documentation

## 2015-12-07 DIAGNOSIS — Y999 Unspecified external cause status: Secondary | ICD-10-CM | POA: Diagnosis not present

## 2015-12-07 DIAGNOSIS — Y9389 Activity, other specified: Secondary | ICD-10-CM | POA: Diagnosis not present

## 2015-12-07 DIAGNOSIS — Z041 Encounter for examination and observation following transport accident: Secondary | ICD-10-CM

## 2015-12-07 MED ORDER — DIAZEPAM 5 MG/ML IJ SOLN
5.0000 mg | Freq: Once | INTRAMUSCULAR | Status: DC
  Filled 2015-12-07: qty 2

## 2015-12-07 MED ORDER — KETOROLAC TROMETHAMINE 60 MG/2ML IM SOLN
60.0000 mg | Freq: Once | INTRAMUSCULAR | Status: AC
  Administered 2015-12-08: 60 mg via INTRAMUSCULAR
  Filled 2015-12-07: qty 2
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
## 2015-12-08 MED ORDER — DIAZEPAM 5 MG PO TABS: 5.0000 mg | ORAL_TABLET | Freq: Two times a day (BID) | ORAL | Status: DC

## 2015-12-08 NOTE — Discharge Instructions (Signed)
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
## 2015-12-10 ENCOUNTER — Ambulatory Visit (INDEPENDENT_AMBULATORY_CARE_PROVIDER_SITE_OTHER): Payer: BLUE CROSS/BLUE SHIELD

## 2015-12-10 ENCOUNTER — Ambulatory Visit (INDEPENDENT_AMBULATORY_CARE_PROVIDER_SITE_OTHER): Payer: BLUE CROSS/BLUE SHIELD | Admitting: Family Medicine

## 2015-12-10 VITALS — BP 128/80 | HR 83 | Temp 97.5°F | Resp 16 | Ht 68.0 in | Wt 232.0 lb

## 2015-12-10 DIAGNOSIS — R519 Headache, unspecified: Secondary | ICD-10-CM

## 2015-12-10 DIAGNOSIS — R51 Headache: Principal | ICD-10-CM

## 2015-12-10 DIAGNOSIS — M549 Dorsalgia, unspecified: Secondary | ICD-10-CM

## 2015-12-10 DIAGNOSIS — R079 Chest pain, unspecified: Secondary | ICD-10-CM

## 2015-12-10 DIAGNOSIS — N3943 Post-void dribbling: Secondary | ICD-10-CM

## 2015-12-10 DIAGNOSIS — M5442 Lumbago with sciatica, left side: Secondary | ICD-10-CM | POA: Diagnosis not present

## 2015-12-10 DIAGNOSIS — M546 Pain in thoracic spine: Secondary | ICD-10-CM | POA: Diagnosis not present

## 2015-12-10 DIAGNOSIS — T148XXA Other injury of unspecified body region, initial encounter: Secondary | ICD-10-CM

## 2015-12-10 LAB — COMPLETE METABOLIC PANEL WITHOUT GFR
ALT: 46 U/L (ref 9–46)
AST: 35 U/L (ref 10–40)
Albumin: 4.6 g/dL (ref 3.6–5.1)
Alkaline Phosphatase: 79 U/L (ref 40–115)
BUN: 19 mg/dL (ref 7–25)
Calcium: 9.6 mg/dL (ref 8.6–10.3)
Creat: 1.03 mg/dL (ref 0.60–1.35)
Glucose, Bld: 86 mg/dL (ref 65–99)
Total Protein: 7.7 g/dL (ref 6.1–8.1)

## 2015-12-10 LAB — POC MICROSCOPIC URINALYSIS (UMFC): Mucus: ABSENT

## 2015-12-10 LAB — POCT CBC
Granulocyte percent: 54.4 %G (ref 37–80)
HCT, POC: 43.3 % — AB (ref 43.5–53.7)
Hemoglobin: 13.9 g/dL — AB (ref 14.1–18.1)
Lymph, poc: 3.6 — AB (ref 0.6–3.4)
MCH, POC: 21.9 pg — AB (ref 27–31.2)
MCHC: 32.1 g/dL (ref 31.8–35.4)
MCV: 68.1 fL — AB (ref 80–97)
MID (cbc): 0.7 (ref 0–0.9)
MPV: 6.8 fL (ref 0–99.8)
POC Granulocyte: 5.2 (ref 2–6.9)
POC LYMPH PERCENT: 38 % (ref 10–50)
POC MID %: 7.6 %M (ref 0–12)
Platelet Count, POC: 288 10*3/uL (ref 142–424)
RBC: 6.36 M/uL — AB (ref 4.69–6.13)
RDW, POC: 16.1 %
WBC: 9.6 10*3/uL (ref 4.6–10.2)

## 2015-12-10 LAB — COMPLETE METABOLIC PANEL WITH GFR
CO2: 26 mmol/L (ref 20–31)
Chloride: 102 mmol/L (ref 98–110)
GFR, Est African American: >89 mL/min (ref >=60)
GFR, Est Non African American: >89 mL/min (ref >=60)
Potassium: 4.5 mmol/L (ref 3.5–5.3)
Sodium: 136 mmol/L (ref 135–146)
Total Bilirubin: 0.5 mg/dL (ref 0.2–1.2)

## 2015-12-10 LAB — POCT URINALYSIS DIP (MANUAL ENTRY)
Bilirubin, UA: NEGATIVE
Blood, UA: NEGATIVE
Glucose, UA: NEGATIVE
Ketones, POC UA: NEGATIVE
Leukocytes, UA: NEGATIVE
Nitrite, UA: NEGATIVE
Protein Ur, POC: NEGATIVE
Spec Grav, UA: 1.02
Urobilinogen, UA: 0.2
pH, UA: 7

## 2015-12-10 MED ORDER — METHYLPREDNISOLONE 4 MG PO TBPK: ORAL_TABLET | ORAL | Status: DC

## 2015-12-10 MED ORDER — NAPROXEN 500 MG PO TABS: 500.0000 mg | ORAL_TABLET | Freq: Two times a day (BID) | ORAL | Status: DC

## 2015-12-10 MED ORDER — ACETAMINOPHEN ER 650 MG PO TBCR: 650.0000 mg | EXTENDED_RELEASE_TABLET | Freq: Three times a day (TID) | ORAL | Status: DC | PRN

## 2015-12-10 NOTE — Patient Instructions (Signed)
Because you received an x-ray today, you will receive an invoice from [facility]. Please contact [facility] at [phone] with questions or concerns regarding your invoice. Our billing staff will not be able to assist you with those questions. °
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
## 2015-12-17 ENCOUNTER — Telehealth: Payer: Self-pay

## 2015-12-17 NOTE — Telephone Encounter (Signed)
Patient stated Dr Conley Rolls at his visit on 12/10/15 stating patient should follow up with a sickle cell disease provider. Patient is requesting a recommendation of whom he should see. Patients call back number is [phone]

## 2015-12-19 NOTE — Telephone Encounter (Signed)
Can someone call him and ask him what he is talking about. I referred  Him to PT. There was no mention of sickle cell anemia in my notes.

## 2015-12-22 NOTE — Telephone Encounter (Signed)
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

## 2015-12-29 NOTE — Telephone Encounter (Signed)
I suspect that she told him that it was a possibility due to his anemia.  I would recommend that he RTC to have further work-up.

## 2016-01-06 ENCOUNTER — Telehealth: Payer: Self-pay

## 2016-01-06 NOTE — Telephone Encounter (Signed)
Pt would like to come by and pick up a CD of his xrays. Please call [phone] when ready for pick up

## 2016-01-07 ENCOUNTER — Telehealth: Payer: Self-pay

## 2016-01-07 NOTE — Telephone Encounter (Signed)
Informed disk is ready.Marland KitchenMarland KitchenMarland Kitchen

## 2016-01-07 NOTE — Telephone Encounter (Signed)
Pt is req. Refill for naproxen (NAPROSYN) 500 MG tablet [[id number]]/ acetaminophen (TYLENOL 8 HOUR) 650 MG CR tablet Pharmacy:  [id number] - [place], [place] - [address] AT [facility] [address]  Please call to adv. if OV is required [phone]

## 2016-01-09 NOTE — Telephone Encounter (Signed)
Patient states he is getting any better,  Informed patient if his symptoms were not improving he would need to return to clinic

## 2016-01-27 ENCOUNTER — Encounter: Payer: Self-pay | Admitting: Family Medicine

## 2016-02-20 ENCOUNTER — Emergency Department (HOSPITAL_COMMUNITY)
Admission: EM | Admit: 2016-02-20 | Discharge: 2016-02-20 | Disposition: A | Discharge status: Home / Self Care | Payer: BLUE CROSS/BLUE SHIELD | Attending: Emergency Medicine | Admitting: Emergency Medicine

## 2016-02-20 ENCOUNTER — Emergency Department (HOSPITAL_COMMUNITY): Payer: BLUE CROSS/BLUE SHIELD

## 2016-02-20 ENCOUNTER — Encounter (HOSPITAL_COMMUNITY): Payer: Self-pay

## 2016-02-20 DIAGNOSIS — J069 Acute upper respiratory infection, unspecified: Secondary | ICD-10-CM

## 2016-02-20 DIAGNOSIS — F17210 Nicotine dependence, cigarettes, uncomplicated: Secondary | ICD-10-CM | POA: Diagnosis not present

## 2016-02-20 DIAGNOSIS — J029 Acute pharyngitis, unspecified: Secondary | ICD-10-CM | POA: Diagnosis present

## 2016-02-20 LAB — RAPID STREP SCREEN (MED CTR MEBANE ONLY): STREPTOCOCCUS, GROUP A SCREEN (DIRECT): NEGATIVE

## 2016-02-20 MED ORDER — AZITHROMYCIN 250 MG PO TABS
500.0000 mg | ORAL_TABLET | Freq: Once | ORAL | Status: AC
  Administered 2016-02-20: 500 mg via ORAL
  Filled 2016-02-20: qty 2

## 2016-02-20 MED ORDER — AZITHROMYCIN 250 MG PO TABS: 250.0000 mg | ORAL_TABLET | Freq: Every day | ORAL | Status: DC

## 2016-02-20 NOTE — ED Provider Notes (Signed)
CSN: [id number]     Arrival date & time 02/20/16  0032 History   First MD Initiated Contact with Patient 02/20/16 [phone]     Chief Complaint  Patient presents with  . Sore Throat  . Cough     (Consider location/radiation/quality/duration/timing/severity/associated sxs/prior Treatment) HPI   Patient to the ER with complaints of sore throat and cough since Monday. He states that the cough has been productive with specks of blood in it. His chest is sore from coughing so much. He has tried over the counter medications but does not feel like it has been helping. He denies body aches, fevers, N/V/D. He denies having any headaches, neck pain. He has had decreased energy. Normal food/fluid intake. He is a current every day smoker. No chest pain when he is not coughing.  History reviewed. No pertinent past medical history. History reviewed. No pertinent past surgical history. Family History  Problem Relation Age of Onset  . Diabetes Other    Social History  Substance Use Topics  . Smoking status: Current Some Day Smoker    Types: Cigarettes  . Smokeless tobacco: Never Used  . Alcohol Use: No    Review of Systems Review of Systems All other systems negative except as documented in the HPI. All pertinent positives and negatives as reviewed in the HPI.    Allergies  Review of patient's allergies indicates no known allergies.  Home Medications   Prior to Admission medications   Medication Sig Start Date End Date Taking? Authorizing Provider  acetaminophen (TYLENOL 8 HOUR) 650 MG CR tablet Take 1 tablet (650 mg total) by mouth every 8 (eight) hours as needed for pain. Patient not taking: Reported on 02/20/2016 12/10/15   Thao P Le, DO  azithromycin (ZITHROMAX) 250 MG tablet Take 1 tablet (250 mg total) by mouth daily. Take  1 tab every day until finished. 02/20/16   Zymiere Trostle Neva Seat, PA-C  diazepam (VALIUM) 5 MG tablet Take 1 tablet (5 mg total) by mouth 2 (two) times daily. Patient not taking:  Reported on 02/20/2016 12/08/15   Marlon Pel, PA-C  methylPREDNISolone (MEDROL DOSEPAK) 4 MG TBPK tablet Take as directed Patient not taking: Reported on 02/20/2016 12/10/15   Thao P Le, DO  naproxen (NAPROSYN) 500 MG tablet Take 1 tablet (500 mg total) by mouth 2 (two) times daily with a meal. No other NSAIDs, take after steroid pack is done Patient not taking: Reported on 02/20/2016 12/10/15   Thao P Le, DO   BP 120/84 mmHg  Pulse 94  Temp(Src) 98.1 F (36.7 C) (Oral)  Resp 16  SpO2 93% Physical Exam  Constitutional: He appears well-developed and well-nourished. No distress.  HENT:  Head: Normocephalic and atraumatic.  Right Ear: Tympanic membrane and ear canal normal.  Left Ear: Tympanic membrane and ear canal normal.  Nose: Nose normal.  Mouth/Throat: Uvula is midline and mucous membranes are normal. Posterior oropharyngeal edema and posterior oropharyngeal erythema present.  Eyes: Pupils are equal, round, and reactive to light.  Neck: Normal range of motion. Neck supple.  Cardiovascular: Normal rate and regular rhythm.   Pulmonary/Chest: Effort normal and breath sounds normal. No accessory muscle usage. No respiratory distress.  Abdominal: Soft.  No signs of abdominal distention  Musculoskeletal:  No LE swelling  Neurological: He is alert.  Acting at baseline  Skin: Skin is warm and dry. No rash noted.  Nursing note and vitals reviewed.   ED Course  Procedures (including critical care time) Labs Review Labs Reviewed  RAPID STREP SCREEN (NOT AT [facility])  CULTURE, GROUP A STREP [facility]    Imaging Review Dg Chest 2 View  02/20/2016  CLINICAL DATA:  Chest pain and productive cough. EXAM: CHEST  2 VIEW COMPARISON:  12/10/2015 FINDINGS: The cardiomediastinal contours are normal. The lungs are clear. Pulmonary vasculature is normal. No consolidation, pleural effusion, or pneumothorax. No acute osseous abnormalities are seen. IMPRESSION: No acute pulmonary process. Electronically  Signed   By: Rubye OaksMelanie  Ehinger M.D.   On: 02/20/2016 01:41   I have personally reviewed and evaluated these images and lab results as part of my medical decision-making.   EKG Interpretation None      MDM   Final diagnoses:  URI (upper respiratory infection)    Pt symptoms consistent with URI. CXR negative for acute infiltrate. Pt will be discharged with symptomatic treatment and Azithromycin.  Discussed return precautions.  Pt is hemodynamically stable & in NAD prior to discharge.    Marlon Peliffany Tram Wrenn, PA-C 02/20/16 0345  Mancel BaleElliott Wentz, MD 02/20/16 [phone]

## 2016-02-20 NOTE — ED Notes (Signed)
Pt states he started having sore throat with productive cough on this past Monday; Pt states he has been coughing so much that chest hurts and he has coughed up some bright red blood at times; Pt states he has taken OTC meds with no relief; pt states 10/10 pain for throat and chest pain; Pt a&o x 4 on arrival;

## 2016-02-20 NOTE — Discharge Instructions (Signed)
Upper Respiratory Infection, Adult °Most upper respiratory infections (URIs) are a viral infection of the air passages leading to the lungs. A URI affects the nose, throat, and upper air passages. The most common type of URI is nasopharyngitis and is typically referred to as "the common cold." °URIs run their course and usually go away on their own. Most of the time, a URI does not require medical attention, but sometimes a bacterial infection in the upper airways can follow a viral infection. This is called a secondary infection. Sinus and middle ear infections are common types of secondary upper respiratory infections. °Bacterial pneumonia can also complicate a URI. A URI can worsen asthma and chronic obstructive pulmonary disease (COPD). Sometimes, these complications can require emergency medical care and may be life threatening.  °CAUSES °Almost all URIs are caused by viruses. A virus is a type of germ and can spread from one person to another.  °RISKS FACTORS °You may be at risk for a URI if:  °· You smoke.   °· You have chronic heart or lung disease. °· You have a weakened defense (immune) system.   °· You are very young or very old.   °· You have nasal allergies or asthma. °· You work in crowded or poorly ventilated areas. °· You work in health care facilities or schools. °SIGNS AND SYMPTOMS  °Symptoms typically develop 2-3 days after you come in contact with a cold virus. Most viral URIs last 7-10 days. However, viral URIs from the influenza virus (flu virus) can last 14-18 days and are typically more severe. Symptoms may include:  °· Runny or stuffy (congested) nose.   °· Sneezing.   °· Cough.   °· Sore throat.   °· Headache.   °· Fatigue.   °· Fever.   °· Loss of appetite.   °· Pain in your forehead, behind your eyes, and over your cheekbones (sinus pain). °· Muscle aches.   °DIAGNOSIS  °Your health care provider may diagnose a URI by: °· Physical exam. °· Tests to check that your symptoms are not due to  another condition such as: °· Strep throat. °· Sinusitis. °· Pneumonia. °· Asthma. °TREATMENT  °A URI goes away on its own with time. It cannot be cured with medicines, but medicines may be prescribed or recommended to relieve symptoms. Medicines may help: °· Reduce your fever. °· Reduce your cough. °· Relieve nasal congestion. °HOME CARE INSTRUCTIONS  °· Take medicines only as directed by your health care provider.   °· Gargle warm saltwater or take cough drops to comfort your throat as directed by your health care provider. °· Use a warm mist humidifier or inhale steam from a shower to increase air moisture. This may make it easier to breathe. °· Drink enough fluid to keep your urine clear or pale yellow.   °· Eat soups and other clear broths and maintain good nutrition.   °· Rest as needed.   °· Return to work when your temperature has returned to normal or as your health care provider advises. You may need to stay home longer to avoid infecting others. You can also use a face mask and careful hand washing to prevent spread of the virus. °· Increase the usage of your inhaler if you have asthma.   °· Do not use any tobacco products, including cigarettes, chewing tobacco, or electronic cigarettes. If you need help quitting, ask your health care provider. °PREVENTION  °The best way to protect yourself from getting a cold is to practice good hygiene.  °· Avoid oral or hand contact with people with cold   symptoms.   Wash your hands often if contact occurs.  There is no clear evidence that vitamin C, vitamin E, echinacea, or exercise reduces the chance of developing a cold. However, it is always recommended to get plenty of rest, exercise, and practice good nutrition.  SEEK MEDICAL CARE IF:   You are getting worse rather than better.   Your symptoms are not controlled by medicine.   You have chills.  You have worsening shortness of breath.  You have brown or red mucus.  You have yellow or brown nasal  discharge.  You have pain in your face, especially when you bend forward.  You have a fever.  You have swollen neck glands.  You have pain while swallowing.  You have white areas in the back of your throat. SEEK IMMEDIATE MEDICAL CARE IF:   You have severe or persistent:  Headache.  Ear pain.  Sinus pain.  Chest pain.  You have chronic lung disease and any of the following:  Wheezing.  Prolonged cough.  Coughing up blood.  A change in your usual mucus.  You have a stiff neck.  You have changes in your:  Vision.  Hearing.  Thinking.  Mood. MAKE SURE YOU: Understand these instructions.  Sore Throat A sore throat is pain, burning, irritation, or scratchiness of the throat. There is often pain or tenderness when swallowing or talking. A sore throat may be accompanied by other symptoms, such as coughing, sneezing, fever, and swollen neck glands. A sore throat is often the first sign of another sickness, such as a cold, flu, strep throat, or mononucleosis (commonly known as mono). Most sore throats go away without medical treatment. CAUSES  The most common causes of a sore throat include:  A viral infection, such as a cold, flu, or mono.  A bacterial infection, such as strep throat, tonsillitis, or whooping cough.  Seasonal allergies.  Dryness in the air.  Irritants, such as smoke or pollution.  Gastroesophageal reflux disease (GERD). HOME CARE INSTRUCTIONS   Only take over-the-counter medicines as directed by your caregiver.  Drink enough fluids to keep your urine clear or pale yellow.  Rest as needed.  Try using throat sprays, lozenges, or sucking on hard candy to ease any pain (if older than 4 years or as directed).  Sip warm liquids, such as broth, herbal tea, or warm water with honey to relieve pain temporarily. You may also eat or drink cold or frozen liquids such as frozen ice pops.  Gargle with salt water (mix 1 tsp salt with 8 oz of  water).  Do not smoke and avoid secondhand smoke.  Put a cool-mist humidifier in your bedroom at night to moisten the air. You can also turn on a hot shower and sit in the bathroom with the door closed for 5-10 minutes. SEEK IMMEDIATE MEDICAL CARE IF:  You have difficulty breathing.  You are unable to swallow fluids, soft foods, or your saliva.  You have increased swelling in the throat.  Your sore throat does not get better in 7 days.  You have nausea and vomiting.  You have a fever or persistent symptoms for more than 2-3 days.  You have a fever and your symptoms suddenly get worse. MAKE SURE YOU:   Understand these instructions.  Will watch your condition.  Will get help right away if you are not doing well or get worse.   This information is not intended to replace advice given to you by your health  care provider. Make sure you discuss any questions you have with your health care provider.   Document Released: 12/08/2004 Document Revised: 11/21/2014 Document Reviewed: 07/08/2012 Elsevier Interactive Patient Education 2016 [employer]    Will watch your condition.  Will get help right away if you are not doing well or get worse.   This information is not intended to replace advice given to you by your health care provider. Make sure you discuss any questions you have with your health care provider.   Document Released: 04/26/2001 Document Revised: 03/17/2015 Document Reviewed: 02/05/2014 Elsevier Interactive Patient Education [employer] Inc.

## 2016-02-22 LAB — CULTURE, GROUP A STREP (THRC)

## 2016-03-01 ENCOUNTER — Ambulatory Visit (INDEPENDENT_AMBULATORY_CARE_PROVIDER_SITE_OTHER): Payer: BLUE CROSS/BLUE SHIELD | Admitting: Family Medicine

## 2016-03-01 VITALS — BP 124/76 | HR 94 | Temp 97.8°F | Resp 18 | Ht 68.0 in | Wt 220.8 lb

## 2016-03-01 DIAGNOSIS — J0100 Acute maxillary sinusitis, unspecified: Secondary | ICD-10-CM | POA: Diagnosis not present

## 2016-03-01 DIAGNOSIS — J069 Acute upper respiratory infection, unspecified: Secondary | ICD-10-CM | POA: Diagnosis not present

## 2016-03-01 MED ORDER — HYDROCODONE-HOMATROPINE 5-1.5 MG/5ML PO SYRP: 5.0000 mL | ORAL_SOLUTION | Freq: Three times a day (TID) | ORAL | Status: DC | PRN

## 2016-03-01 MED ORDER — AMOXICILLIN 875 MG PO TABS: 875.0000 mg | ORAL_TABLET | Freq: Two times a day (BID) | ORAL | Status: DC

## 2016-03-01 MED ORDER — MELOXICAM 7.5 MG PO TABS: 7.5000 mg | ORAL_TABLET | Freq: Every day | ORAL | Status: DC

## 2016-03-01 NOTE — Addendum Note (Signed)
Addended by: Elvina SidleLAUENSTEIN, Hamp Moreland on: 03/01/2016 04:56 PM   Modules accepted: Kipp BroodSmartSet

## 2016-03-01 NOTE — Patient Instructions (Addendum)

## 2016-03-01 NOTE — Progress Notes (Addendum)
Trevor Wallace. MRN: [id number], DOB: Dec 22, 1975, 40 y.o. Date of Encounter: 03/01/2016, 4:54 PM  Primary Physician: Pcp Not In System  Chief Complaint:  Chief Complaint  Patient presents with  . URI    x2 weeks    HPI: 40 y.o. year old male presents with a 10 day history of nasal congestion, post nasal drip, sore throat, and cough. Mild sinus pressure. Afebrile. No chills. Nasal congestion thick and green/yellow. Cough is productive of green/yellow sputum and not associated with time of day. Ears feel full, leading to sensation of muffled hearing. Has tried OTC cold preps without success. No GI complaints.   No sick contacts, recent antibiotics, or recent travels.   Patient works for [employer] which is quite physical work. He says that his children are staying get sick as wife is also developing a mild sore throat  No leg trauma, sedentary periods, h/o cancer, or tobacco use.  History reviewed. No pertinent past medical history.   Home Meds: Prior to Admission medications   Medication Sig Start Date End Date Taking? Authorizing Provider  acetaminophen (TYLENOL 8 HOUR) 650 MG CR tablet Take 1 tablet (650 mg total) by mouth every 8 (eight) hours as needed for pain. Patient not taking: Reported on 02/20/2016 12/10/15   Thao P Le, DO  azithromycin (ZITHROMAX) 250 MG tablet Take 1 tablet (250 mg total) by mouth daily. Take  1 tab every day until finished. Patient not taking: Reported on 03/01/2016 02/20/16   Marlon Peliffany Greene, PA-C  diazepam (VALIUM) 5 MG tablet Take 1 tablet (5 mg total) by mouth 2 (two) times daily. Patient not taking: Reported on 02/20/2016 12/08/15   Marlon Peliffany Greene, PA-C  methylPREDNISolone (MEDROL DOSEPAK) 4 MG TBPK tablet Take as directed Patient not taking: Reported on 02/20/2016 12/10/15   Thao P Le, DO  naproxen (NAPROSYN) 500 MG tablet Take 1 tablet (500 mg total) by mouth 2 (two) times daily with a meal. No other NSAIDs, take after steroid pack is  done Patient not taking: Reported on 02/20/2016 12/10/15   Thao P Le, DO    Allergies: No Known Allergies  Social History   Social History  . Marital Status: Single    Spouse Name: N/A  . Number of Children: N/A  . Years of Education: N/A   Occupational History  . Not on file.   Social History Main Topics  . Smoking status: Current Some Day Smoker    Types: Cigarettes  . Smokeless tobacco: Never Used  . Alcohol Use: No  . Drug Use: No  . Sexual Activity: Not on file   Other Topics Concern  . Not on file   Social History Narrative     Review of Systems: Constitutional: negative for chills, fever, night sweats or weight changes Cardiovascular: negative for chest pain or palpitations Respiratory: negative for hemoptysis, wheezing, or shortness of breath Abdominal: negative for abdominal pain, nausea, vomiting or diarrhea Dermatological: negative for rash Neurologic: negative for headache   Physical Exam: Blood pressure 124/76, pulse 94, temperature 97.8 F (36.6 C), temperature source Oral, resp. rate 18, height 5\' 8"  (1.727 m), weight 220 lb 12.8 oz (100.154 kg), SpO2 96 %., Body mass index is 33.58 kg/(m^2). General: Well developed, well nourished, in no acute distress. Head: Normocephalic, atraumatic, eyes without discharge, sclera non-icteric, nares are congested. Bilateral auditory canals clear, TM's are without perforation, pearly grey with reflective cone of light bilaterally. No sinus TTP. Oral cavity moist, dentition normal. Posterior pharynx with post nasal  drip and mild erythema. No peritonsillar abscess or tonsillar exudate. Neck: Supple. No thyromegaly. Full ROM. No lymphadenopathy. Lungs: Coarse breath sounds bilaterally without wheezes, rales, or rhonchi. Breathing is unlabored.  Heart: RRR with S1 S2. No murmurs, rubs, or gallops appreciated. Msk:  Strength and tone normal for age. Extremities: No clubbing or cyanosis. No edema. Neuro: Alert and oriented X  3. Moves all extremities spontaneously. CNII-XII grossly in tact. Psych:  Responds to questions appropriately with a normal affect.     ASSESSMENT AND PLAN:  40 y.o. year old male with bronchitis. -   ICD-9-CM ICD-10-CM   1. Acute upper respiratory infection 465.9 J06.9 HYDROcodone-homatropine (HYCODAN) 5-1.5 MG/5ML syrup     meloxicam (MOBIC) 7.5 MG tablet  2. Acute maxillary sinusitis, recurrence not specified 461.0 J01.00 amoxicillin (AMOXIL) 875 MG tablet   -Tylenol/Motrin prn -Rest/fluids -RTC precautions -RTC 3-5 days if no improvement  Signed, Elvina Sidle, MD 03/01/2016 4:54 PM

## 2016-06-21 ENCOUNTER — Ambulatory Visit: Payer: BLUE CROSS/BLUE SHIELD

## 2016-11-29 ENCOUNTER — Emergency Department (HOSPITAL_COMMUNITY): Payer: BLUE CROSS/BLUE SHIELD

## 2016-11-29 ENCOUNTER — Encounter (HOSPITAL_COMMUNITY): Payer: Self-pay

## 2016-11-29 ENCOUNTER — Emergency Department (HOSPITAL_COMMUNITY)
Admission: EM | Admit: 2016-11-29 | Discharge: 2016-11-29 | Disposition: A | Discharge status: Home / Self Care | Payer: BLUE CROSS/BLUE SHIELD | Attending: Emergency Medicine | Admitting: Emergency Medicine

## 2016-11-29 DIAGNOSIS — F17210 Nicotine dependence, cigarettes, uncomplicated: Secondary | ICD-10-CM | POA: Insufficient documentation

## 2016-11-29 DIAGNOSIS — R0789 Other chest pain: Secondary | ICD-10-CM | POA: Insufficient documentation

## 2016-11-29 DIAGNOSIS — R079 Chest pain, unspecified: Secondary | ICD-10-CM | POA: Diagnosis present

## 2016-11-29 LAB — CBC
HCT: 41.1 % (ref 39.0–52.0)
Hemoglobin: 13.1 g/dL (ref 13.0–17.0)
MCH: 21.9 pg — ABNORMAL LOW (ref 26.0–34.0)
MCHC: 31.9 g/dL (ref 30.0–36.0)
MCV: 68.8 fL — ABNORMAL LOW (ref 78.0–100.0)
PLATELETS: 292 10*3/uL (ref 150–400)
RBC: 5.97 MIL/uL — AB (ref 4.22–5.81)
RDW: 15.9 % — ABNORMAL HIGH (ref 11.5–15.5)
WBC: 9.4 10*3/uL (ref 4.0–10.5)

## 2016-11-29 LAB — BASIC METABOLIC PANEL
Anion gap: 8 (ref 5–15)
BUN: 8 mg/dL (ref 6–20)
CALCIUM: 9.3 mg/dL (ref 8.9–10.3)
CO2: 23 mmol/L (ref 22–32)
CREATININE: 0.82 mg/dL (ref 0.61–1.24)
Chloride: 107 mmol/L (ref 101–111)
GFR calc non Af Amer: >60 mL/min (ref >60)
Glucose, Bld: 117 mg/dL — ABNORMAL HIGH (ref 65–99)
Potassium: 4 mmol/L (ref 3.5–5.1)
SODIUM: 138 mmol/L (ref 135–145)

## 2016-11-29 LAB — I-STAT TROPONIN, ED
TROPONIN I, POC: 0 ng/mL (ref 0.00–0.08)
TROPONIN I, POC: 0 ng/mL (ref 0.00–0.08)
Troponin i, poc: 0 ng/mL (ref 0.00–0.08)

## 2016-11-29 LAB — D-DIMER, QUANTITATIVE: D-Dimer, Quant: 0.33 ug/mL-FEU (ref 0.00–0.50)

## 2016-11-29 MED ORDER — NAPROXEN 500 MG PO TABS: 500.0000 mg | ORAL_TABLET | Freq: Two times a day (BID) | ORAL | 0 refills | Status: DC

## 2016-11-29 NOTE — ED Notes (Signed)
Patient d/c'd from IV, monitor, continuous pulse and blood pressure cuff; patient getting dressed to be discharged home; visitor at bedside

## 2016-11-29 NOTE — Discharge Instructions (Signed)

## 2016-11-29 NOTE — ED Triage Notes (Signed)
Pt. Reports having chest pain for a few weeks. But today he was walking and he felt the chest pain became sharper and his heart began to race and he fell to his knees.  This episode lasted for a few minutes.  He continues to have the chest pain with dizziness and nausea.   ECG completed in triage.  Skin is warm and dry.  Pt. Is alert and oriented X4

## 2016-11-29 NOTE — ED Provider Notes (Signed)
[facility]-EMERGENCY DEPT Provider Note   CSN: [id number] Arrival date & time: 11/29/16  [id number]     History   Chief Complaint Chief Complaint  Patient presents with  . Chest Pain    HPI Trevor RightWilliam Stangeland Jr. is a 41 y.o. male.  HPI Pt comes in with cc of chest pain. Chest pain has been present for few days now, but it was worse today. The chest pain is L sided, sharp and constant. Pain is worse with deep inspiration. Whilst walking, chest pain got severe, pt had associated dib and he felt like he might faint. Pt has no hx of CAD, or any major medical problems. He is a light smoker and denies drug use or premature CAD in the family. Pt has no hx of PE, DVT and denies any exogenous hormone use, long distance travels or surgery in the past 6 weeks, active cancer, recent immobilization.   History reviewed. No pertinent past medical history.  There are no active problems to display for this patient.   History reviewed. No pertinent surgical history.     Home Medications    Prior to Admission medications   Medication Sig Start Date End Date Taking? Authorizing Provider  acetaminophen (TYLENOL 8 HOUR) 650 MG CR tablet Take 1 tablet (650 mg total) by mouth every 8 (eight) hours as needed for pain. Patient not taking: Reported on 11/29/2016 12/10/15   Thao P Le, DO  amoxicillin (AMOXIL) 875 MG tablet Take 1 tablet (875 mg total) by mouth 2 (two) times daily. Patient not taking: Reported on 11/29/2016 03/01/16   Elvina SidleKurt Lauenstein, MD  HYDROcodone-homatropine [facility](HYCODAN) 5-1.5 MG/5ML syrup Take 5 mLs by mouth every 8 (eight) hours as needed for cough. Patient not taking: Reported on 11/29/2016 03/01/16   Elvina SidleKurt Lauenstein, MD  meloxicam (MOBIC) 7.5 MG tablet Take 1 tablet (7.5 mg total) by mouth daily. 03/01/16   Elvina SidleKurt Lauenstein, MD  naproxen (NAPROSYN) 500 MG tablet Take 1 tablet (500 mg total) by mouth 2 (two) times daily. 11/29/16   Derwood KaplanAnkit Chandra Asher, MD    Family History Family History  Problem  Relation Age of Onset  . Diabetes Other     Social History Social History  Substance Use Topics  . Smoking status: Current Some Day Smoker    Types: Cigarettes  . Smokeless tobacco: Never Used  . Alcohol use No     Allergies   Patient has no known allergies.   Review of Systems Review of Systems  ROS 10 Systems reviewed and are negative for acute change except as noted in the HPI.    Physical Exam Updated Vital Signs BP 106/72   Pulse 62   Temp 98.7 F (37.1 C) (Oral)   Resp 16   Ht 5\' 11"  (1.803 m)   Wt 220 lb (99.8 kg)   SpO2 96%   BMI 30.68 kg/m   Physical Exam  Constitutional: He is oriented to person, place, and time. He appears well-developed.  HENT:  Head: Normocephalic and atraumatic.  Eyes: Conjunctivae and EOM are normal. Pupils are equal, round, and reactive to light.  Neck: Normal range of motion. Neck supple.  Cardiovascular: Normal rate and regular rhythm.   Pulmonary/Chest: Effort normal and breath sounds normal. He exhibits tenderness.  Abdominal: Soft. Bowel sounds are normal. He exhibits no distension. There is no tenderness. There is no rebound and no guarding.  Neurological: He is alert and oriented to person, place, and time.  Skin: Skin is warm.  Nursing note and  vitals reviewed.     ED Treatments / Results  Labs (all labs ordered are listed, but only abnormal results are displayed) Labs Reviewed  BASIC METABOLIC PANEL - Abnormal; Notable for the following:       Result Value   Glucose, Bld 117 (*)    All other components within normal limits  CBC - Abnormal; Notable for the following:    RBC 5.97 (*)    MCV 68.8 (*)    MCH 21.9 (*)    RDW 15.9 (*)    All other components within normal limits  D-DIMER, QUANTITATIVE (NOT AT [facility])  I-STAT TROPOININ, ED  I-STAT TROPOININ, ED    EKG  EKG Interpretation  Date/Time:  Tuesday November 29 2016 10:04:30 EST Ventricular Rate:  68 PR Interval:  150 QRS Duration: 92 QT  Interval:  364 QTC Calculation: 387 R Axis:   62 Text Interpretation:  Normal sinus rhythm Normal ECG No acute changes No old tracing to compare s1q3t3 pattern seen Confirmed by Rhunette Croft, MD, Janey Genta [phone]) on 11/29/2016 10:31:25 AM       Radiology Dg Chest 2 View  Result Date: 11/29/2016 CLINICAL DATA:  Chest pain for several weeks, rapid heart rate EXAM: CHEST  2 VIEW COMPARISON:  Chest x-ray of 02/20/2016 FINDINGS: No active infiltrate or effusion is seen. Mediastinal and hilar contours are unremarkable. The heart is within normal limits in size. No bony abnormality is seen. IMPRESSION: No active cardiopulmonary disease. Electronically Signed   By: Dwyane Dee M.D.   On: 11/29/2016 11:11    Procedures Procedures (including critical care time)  Medications Ordered in ED Medications - No data to display   Initial Impression / Assessment and Plan / ED Course  I have reviewed the triage vital signs and the nursing notes.  Pertinent labs & imaging results that were available during my care of the patient were reviewed by me and considered in my medical decision making (see chart for details).  Clinical Course     Pt with atypical chest pain that has some exertional and pleuritic component. Trops x 2 neg. Dimer is neg. PERC neg as well. EKG did show s1q3t3. Pt will get outpatient cards f/u info. Maybe he might benefit from an echocardiogram. Strict ER return precautions have been discussed, and patient is agreeing with the plan and is comfortable with the workup done and the recommendations from the ER.   Final Clinical Impressions(s) / ED Diagnoses   Final diagnoses:  Atypical chest pain    New Prescriptions New Prescriptions   NAPROXEN (NAPROSYN) 500 MG TABLET    Take 1 tablet (500 mg total) by mouth 2 (two) times daily.     Derwood Kaplan, MD 11/29/16 1437

## 2017-02-02 ENCOUNTER — Telehealth: Payer: Self-pay

## 2017-02-15 ENCOUNTER — Ambulatory Visit (INDEPENDENT_AMBULATORY_CARE_PROVIDER_SITE_OTHER): Payer: BLUE CROSS/BLUE SHIELD | Admitting: Internal Medicine

## 2017-02-15 ENCOUNTER — Other Ambulatory Visit (INDEPENDENT_AMBULATORY_CARE_PROVIDER_SITE_OTHER): Payer: BLUE CROSS/BLUE SHIELD

## 2017-02-15 ENCOUNTER — Encounter: Payer: Self-pay | Admitting: Internal Medicine

## 2017-02-15 VITALS — BP 98/72 | Ht 68.0 in | Wt 209.0 lb

## 2017-02-15 DIAGNOSIS — R7303 Prediabetes: Secondary | ICD-10-CM

## 2017-02-15 DIAGNOSIS — F17200 Nicotine dependence, unspecified, uncomplicated: Secondary | ICD-10-CM

## 2017-02-15 DIAGNOSIS — Z0000 Encounter for general adult medical examination without abnormal findings: Secondary | ICD-10-CM | POA: Diagnosis not present

## 2017-02-15 DIAGNOSIS — Z87891 Personal history of nicotine dependence: Secondary | ICD-10-CM | POA: Insufficient documentation

## 2017-02-15 DIAGNOSIS — Z23 Encounter for immunization: Secondary | ICD-10-CM | POA: Diagnosis not present

## 2017-02-15 DIAGNOSIS — Z0001 Encounter for general adult medical examination with abnormal findings: Secondary | ICD-10-CM | POA: Insufficient documentation

## 2017-02-15 DIAGNOSIS — R300 Dysuria: Secondary | ICD-10-CM

## 2017-02-15 DIAGNOSIS — E119 Type 2 diabetes mellitus without complications: Secondary | ICD-10-CM | POA: Insufficient documentation

## 2017-02-15 LAB — URINALYSIS, ROUTINE W REFLEX MICROSCOPIC
Bilirubin Urine: NEGATIVE
HGB URINE DIPSTICK: NEGATIVE
KETONES UR: NEGATIVE
Leukocytes, UA: NEGATIVE
NITRITE: NEGATIVE
Specific Gravity, Urine: 1.02 (ref 1.000–1.030)
Total Protein, Urine: NEGATIVE
Urine Glucose: NEGATIVE
Urobilinogen, UA: 0.2 (ref 0.0–1.0)
pH: 7.5 (ref 5.0–8.0)

## 2017-02-15 LAB — TSH: TSH: 0.71 u[IU]/mL (ref 0.35–4.50)

## 2017-02-15 LAB — HEPATIC FUNCTION PANEL
ALT: 30 U/L (ref 0–53)
AST: 28 U/L (ref 0–37)
Albumin: 4.5 g/dL (ref 3.5–5.2)
Alkaline Phosphatase: 66 U/L (ref 39–117)
BILIRUBIN TOTAL: 0.5 mg/dL (ref 0.2–1.2)
Bilirubin, Direct: 0.1 mg/dL (ref 0.0–0.3)
Total Protein: 7.1 g/dL (ref 6.0–8.3)

## 2017-02-15 LAB — BASIC METABOLIC PANEL
BUN: 12 mg/dL (ref 6–23)
CALCIUM: 9.4 mg/dL (ref 8.4–10.5)
CHLORIDE: 104 meq/L (ref 96–112)
CO2: 32 mEq/L (ref 19–32)
CREATININE: 0.85 mg/dL (ref 0.40–1.50)
GFR: 127.51 mL/min (ref >60.00)
Glucose, Bld: 97 mg/dL (ref 70–99)
Potassium: 4 mEq/L (ref 3.5–5.1)
Sodium: 141 mEq/L (ref 135–145)

## 2017-02-15 LAB — CBC WITH DIFFERENTIAL/PLATELET
BASOS PCT: 0.6 % (ref 0.0–3.0)
Basophils Absolute: 0.1 10*3/uL (ref 0.0–0.1)
EOS PCT: 2.2 % (ref 0.0–5.0)
Eosinophils Absolute: 0.2 10*3/uL (ref 0.0–0.7)
HEMATOCRIT: 39 % (ref 39.0–52.0)
Hemoglobin: 12.3 g/dL — ABNORMAL LOW (ref 13.0–17.0)
LYMPHS PCT: 30.8 % (ref 12.0–46.0)
Lymphs Abs: 3.2 10*3/uL (ref 0.7–4.0)
MCHC: 31.6 g/dL (ref 30.0–36.0)
MONOS PCT: 7.4 % (ref 3.0–12.0)
Monocytes Absolute: 0.8 10*3/uL (ref 0.1–1.0)
NEUTROS ABS: 6.1 10*3/uL (ref 1.4–7.7)
Neutrophils Relative %: 59 % (ref 43.0–77.0)
Platelets: 310 10*3/uL (ref 150.0–400.0)
RBC: 5.7 Mil/uL (ref 4.22–5.81)
RDW: 16.4 % — AB (ref 11.5–15.5)
WBC: 10.2 10*3/uL (ref 4.0–10.5)

## 2017-02-15 LAB — LIPID PANEL
CHOL/HDL RATIO: 5
Cholesterol: 208 mg/dL — ABNORMAL HIGH (ref 0–200)
HDL: 45.3 mg/dL (ref >39.00)
LDL Cholesterol: 135 mg/dL — ABNORMAL HIGH (ref 0–99)
NONHDL: 163.17
Triglycerides: 142 mg/dL (ref 0.0–149.0)
VLDL: 28.4 mg/dL (ref 0.0–40.0)

## 2017-02-15 LAB — HEMOGLOBIN A1C: Hgb A1c MFr Bld: 6.4 % (ref 4.6–6.5)

## 2017-02-15 LAB — HIV ANTIBODY (ROUTINE TESTING W REFLEX): HIV 1&2 Ab, 4th Generation: NONREACTIVE

## 2017-02-15 LAB — PSA: PSA: 0.16 ng/mL (ref 0.10–4.00)

## 2017-02-15 MED ORDER — DOXYCYCLINE HYCLATE 100 MG PO TABS: 100.0000 mg | ORAL_TABLET | Freq: Two times a day (BID) | ORAL | 0 refills | Status: DC

## 2017-02-15 NOTE — Assessment & Plan Note (Signed)
Infrequent a few cigs per wk, but urged to quit.

## 2017-02-15 NOTE — Patient Instructions (Addendum)
Please quit smoking.  You had the tetanus shot today  Please take all new medication as prescribed - the doxycycline antibiotic for 3 weeks  You will be contacted regarding the referral for: urology (although you can cancel if you symptoms are resolved with the antibiotic)  Please continue all other medications as before, and refills have been done if requested.  Please have the pharmacy call with any other refills you may need.  Please continue your efforts at being more active, low cholesterol diet, and weight control.  You are otherwise up to date with prevention measures today.  Please keep your appointments with your specialists as you may have planned  Please go to the LAB in the Basement (turn left off the elevator) for the tests to be done today  You will be contacted by phone if any changes need to be made immediately.  Otherwise, you will receive a letter about your results with an explanation, but please check with MyChart first.  Please remember to sign up for MyChart if you have not done so, as this will be important to you in the future with finding out test results, communicating by private email, and scheduling acute appointments online when needed.  Please return in 1 year for your yearly visit, or sooner if needed, with Lab testing done 3-5 days before

## 2017-02-15 NOTE — Progress Notes (Signed)
Pre visit review using our clinic review tool, if applicable. No additional management support is needed unless otherwise documented below in the visit note. 

## 2017-02-15 NOTE — Progress Notes (Signed)
Subjective:    Patient ID: Trevor Right., male    DOB: 10/25/1976, 41 y.o.   MRN: [id number]  HPI Here for wellness and f/u;  Overall doing ok;  Pt denies Chest pain, worsening SOB, DOE, wheezing, orthopnea, PND, worsening LE edema, palpitations, dizziness or syncope.  Pt denies neurological change such as new headache, facial or extremity weakness.  Pt denies polydipsia, polyuria, or low sugar symptoms. Pt states overall good compliance with treatment and medications, good tolerability, and has been trying to follow appropriate diet.  Pt denies worsening depressive symptoms, suicidal ideation or panic. No fever, night sweats, wt loss, loss of appetite, or other constitutional symptoms.  Pt states good ability with ADL's, has low fall risk, home safety reviewed and adequate, no other significant changes in hearing or vision, and only occasionally active with exercise. Has been working out more, eating better diet and lost wt by his count from 237 to 209 today intentionally. No other history except: Seen at ED at an emotional shock having to do with his daughter, ? Anxiety episode, and had episode of syncope and chest pain.  Was referred to cardiology but did not go. Also feels his "bladder seems inflamed, and also penile itch and burn inside."  Does not want card referral but will accept referral to urology Past Medical History:  Diagnosis Date  . Prediabetes    History reviewed. No pertinent surgical history.  reports that he has been smoking Cigarettes.  He has never used smokeless tobacco. He reports that he does not drink alcohol or use drugs. family history includes Diabetes in his other. No Known Allergies No current outpatient prescriptions on file prior to visit.   No current facility-administered medications on file prior to visit.    Review of Systems Constitutional: Negative for other unusual diaphoresis, sweats, appetite or weight changes HENT: Negative for other worsening  hearing loss, ear pain, facial swelling, mouth sores or neck stiffness.   Eyes: Negative for other worsening pain, redness or other visual disturbance.  Respiratory: Negative for other stridor or swelling Cardiovascular: Negative for other palpitations or other chest pain  Gastrointestinal: Negative for worsening diarrhea or loose stools, blood in stool, distention or other pain Genitourinary: Negative for hematuria, flank pain or other change in urine volume.  Musculoskeletal: Negative for myalgias or other joint swelling.  Skin: Negative for other color change, or other wound or worsening drainage.  Neurological: Negative for other syncope or numbness. Hematological: Negative for other adenopathy or swelling Psychiatric/Behavioral: Negative for hallucinations, other worsening agitation, SI, self-injury, or new decreased concentration All other system neg per pt    Objective:   Physical Exam BP 98/72   Ht  (1.727 m)   Wt 209 lb (94.8 kg)   BMI 31.78 kg/m  VS noted,  Constitutional: Pt is oriented to person, place, and time. Appears well-developed and well-nourished, in no significant distress and comfortable Head: Normocephalic and atraumatic  Eyes: Conjunctivae and EOM are normal. Pupils are equal, round, and reactive to light Right Ear: External ear normal without discharge Left Ear: External ear normal without discharge Nose: Nose without discharge or deformity Mouth/Throat: Oropharynx is without other ulcerations and moist  Neck: Normal range of motion. Neck supple. No JVD present. No tracheal deviation present or significant neck LA or mass Cardiovascular: Normal rate, regular rhythm, normal heart sounds and intact distal pulses.   Pulmonary/Chest: WOB normal and breath sounds without rales or wheezing  Abdominal: Soft. Bowel sounds are normal.  NT. No HSM  Musculoskeletal: Normal range of motion. Exhibits no edema Lymphadenopathy: Has no other cervical adenopathy.    Neurological: Pt is alert and oriented to person, place, and time. Pt has normal reflexes. No cranial nerve deficit. Motor grossly intact, Gait intact Skin: Skin is warm and dry. No rash noted or new ulcerations Psychiatric:  Has normal mood and affect. Behavior is normal without agitation No other exam findings    Assessment & Plan:

## 2017-02-16 ENCOUNTER — Encounter: Payer: Self-pay | Admitting: Internal Medicine

## 2017-02-20 NOTE — Assessment & Plan Note (Signed)
Urged attention to wt control efforts

## 2017-02-20 NOTE — Assessment & Plan Note (Signed)
For urology referral 

## 2017-02-20 NOTE — Assessment & Plan Note (Signed)

## 2017-06-27 ENCOUNTER — Emergency Department (HOSPITAL_COMMUNITY)
Admission: EM | Admit: 2017-06-27 | Discharge: 2017-06-27 | Disposition: A | Discharge status: Home / Self Care | Payer: BLUE CROSS/BLUE SHIELD | Attending: Emergency Medicine | Admitting: Emergency Medicine

## 2017-06-27 ENCOUNTER — Emergency Department (HOSPITAL_COMMUNITY): Payer: BLUE CROSS/BLUE SHIELD

## 2017-06-27 ENCOUNTER — Encounter (HOSPITAL_COMMUNITY): Payer: Self-pay | Admitting: Emergency Medicine

## 2017-06-27 DIAGNOSIS — R109 Unspecified abdominal pain: Secondary | ICD-10-CM

## 2017-06-27 DIAGNOSIS — R7303 Prediabetes: Secondary | ICD-10-CM | POA: Insufficient documentation

## 2017-06-27 DIAGNOSIS — R1032 Left lower quadrant pain: Secondary | ICD-10-CM | POA: Insufficient documentation

## 2017-06-27 DIAGNOSIS — Z79899 Other long term (current) drug therapy: Secondary | ICD-10-CM | POA: Insufficient documentation

## 2017-06-27 DIAGNOSIS — F17210 Nicotine dependence, cigarettes, uncomplicated: Secondary | ICD-10-CM | POA: Insufficient documentation

## 2017-06-27 DIAGNOSIS — R300 Dysuria: Secondary | ICD-10-CM | POA: Insufficient documentation

## 2017-06-27 DIAGNOSIS — R350 Frequency of micturition: Secondary | ICD-10-CM | POA: Insufficient documentation

## 2017-06-27 LAB — URINALYSIS, ROUTINE W REFLEX MICROSCOPIC
BILIRUBIN URINE: NEGATIVE
Bacteria, UA: NONE SEEN
GLUCOSE, UA: NEGATIVE mg/dL
HGB URINE DIPSTICK: NEGATIVE
Ketones, ur: NEGATIVE mg/dL
LEUKOCYTES UA: NEGATIVE
NITRITE: NEGATIVE
Protein, ur: 30 mg/dL — AB
SPECIFIC GRAVITY, URINE: 1.023 (ref 1.005–1.030)
pH: 5 (ref 5.0–8.0)

## 2017-06-27 MED ORDER — CYCLOBENZAPRINE HCL 10 MG PO TABS: 10.0000 mg | ORAL_TABLET | Freq: Three times a day (TID) | ORAL | 0 refills | Status: DC | PRN

## 2017-06-27 MED ORDER — NAPROXEN 375 MG PO TABS: 375.0000 mg | ORAL_TABLET | Freq: Two times a day (BID) | ORAL | 0 refills | Status: DC

## 2017-06-27 NOTE — ED Provider Notes (Signed)
[facility]EMERGENCY DEPT Provider Note   CSN: [id number] Arrival date & time: 06/27/17  4098     History   Chief Complaint Chief Complaint  Patient presents with  . Flank Pain    HPI Trevor Wallace. is a 41 y.o. male.  L side/flank pain for the past 3 days Denies diarrhea/constipation endorses dysuria and frequency denies penile discharge  Took Ibuprofen X 1 with no relief       Past Medical History:  Diagnosis Date  . Prediabetes     Patient Active Problem List   Diagnosis Date Noted  . Preventative health care 02/15/2017  . Smoker 02/15/2017  . Dysuria 02/15/2017  . Prediabetes     History reviewed. No pertinent surgical history.     Home Medications    Prior to Admission medications   Medication Sig Start Date End Date Taking? Authorizing Provider  doxycycline (VIBRA-TABS) 100 MG tablet Take 1 tablet (100 mg total) by mouth 2 (two) times daily. 02/15/17   Corwin Levins, MD    Family History Family History  Problem Relation Age of Onset  . Diabetes Other     Social History Social History  Substance Use Topics  . Smoking status: Current Some Day Smoker    Types: Cigarettes  . Smokeless tobacco: Never Used  . Alcohol use No     Allergies   Patient has no known allergies.   Review of Systems Review of Systems  Constitutional: Negative for fever.  Respiratory: Negative for shortness of breath.   Cardiovascular: Negative for chest pain.  Genitourinary: Positive for dysuria and frequency. Negative for discharge, penile pain, penile swelling, scrotal swelling and testicular pain.  All other systems reviewed and are negative.    Physical Exam Updated Vital Signs BP 126/85 (BP Location: Left Arm)   Pulse 85   Temp 97.6 F (36.4 C) (Oral)   Resp 18   Ht 5\' 9"  (1.753 m)   Wt 94.8 kg (209 lb)   SpO2 95%   BMI 30.86 kg/m   Physical Exam  Constitutional: He appears well-developed and well-nourished.  Eyes: Pupils are equal, round, and  reactive to light.  Neck: Normal range of motion.  Cardiovascular: Normal rate.   Pulmonary/Chest: Effort normal.  Abdominal: Soft. Bowel sounds are normal. There is no hepatosplenomegaly. There is tenderness.    Musculoskeletal: Normal range of motion.  Neurological: He is alert.  Skin: Skin is warm. No rash noted. No erythema.  Nursing note and vitals reviewed.    ED Treatments / Results  Labs (all labs ordered are listed, but only abnormal results are displayed) Labs Reviewed  URINALYSIS, ROUTINE W REFLEX MICROSCOPIC - Abnormal; Notable for the following:       Result Value   APPearance HAZY (*)    Protein, ur 30 (*)    Squamous Epithelial / LPF 0-5 (*)    All other components within normal limits    EKG  EKG Interpretation None       Radiology No results found.  Procedures Procedures (including critical care time)  Medications Ordered in ED Medications - No data to display   Initial Impression / Assessment and Plan / ED Course  I have reviewed the triage vital signs and the nursing notes.  Pertinent labs & imaging results that were available during my care of the patient were reviewed by me and considered in my medical decision making (see chart for details).      Will obtain renal stone study but  I feel this is more Musculoskeletal  Final Clinical Impressions(s) / ED Diagnoses   Final diagnoses:  None    New Prescriptions New Prescriptions   No medications on file     Earley FavorSchulz, Kayda Allers, NP 06/27/17 [id number]    Zadie RhineWickline, Donald, MD 06/27/17 [phone]

## 2017-06-27 NOTE — ED Triage Notes (Signed)
Pt c/o left flank pain for the past few days getting worse today with some burning sensation on the urine.

## 2017-06-27 NOTE — ED Notes (Signed)
Patient transported to CT 

## 2017-06-27 NOTE — Discharge Instructions (Signed)
Your imaging has been normal. This is likely musculoskeletal.  Please take the Naproxen as prescribed for pain. Do not take any additional NSAIDs including Motrin, Aleve, Ibuprofen, Advil.  Please the the Flexeril for muscle relaxation. This medication will make you drowsy so avoid situation that could place you in danger.   Workup has been normal. Please take medications as prescribed and instructed.  SEEK IMMEDIATE MEDICAL ATTENTION IF: New numbness, tingling, weakness, or problem with the use of your arms or legs.  Severe back pain not relieved with medications.  Change in bowel or bladder control.  Urinary retention.  Numbness in your groin.  Increasing pain in any areas of the body (such as chest or abdominal pain).  Shortness of breath, dizziness or fainting.  Nausea (feeling sick to your stomach), vomiting, fever, or sweats.

## 2017-06-27 NOTE — ED Provider Notes (Signed)
Care assumed from previous provider PA Shulz. Please see their note for further details to include full history and physical. Case discussed, plan agreed upon. At time of care hand off was awaiting CT of abdomen and pelvis to rule out stone. CT shows no acute abnormalities. The patient's pain is likely due to musculoskeletal. Prescribed anti-inflammatories and muscle relaxer.   Pt is hemodynamically stable, in NAD, & able to ambulate in the ED. Evaluation does not show pathology that would require ongoing emergent intervention or inpatient treatment. I explained the diagnosis to the patient. Pain has been managed & has no complaints prior to dc. Pt is comfortable with above plan and is stable for discharge at this time. All questions were answered prior to disposition. Strict return precautions for f/u to the ED were discussed. Encouraged follow up with PCP.       Rise MuLeaphart, Kenneth T, PA-C 06/27/17 0715

## 2017-07-12 ENCOUNTER — Emergency Department (HOSPITAL_COMMUNITY)
Admission: EM | Admit: 2017-07-12 | Discharge: 2017-07-12 | Disposition: A | Discharge status: Home / Self Care | Payer: BLUE CROSS/BLUE SHIELD | Attending: Emergency Medicine | Admitting: Emergency Medicine

## 2017-07-12 ENCOUNTER — Encounter (HOSPITAL_COMMUNITY): Payer: Self-pay | Admitting: *Deleted

## 2017-07-12 DIAGNOSIS — F17210 Nicotine dependence, cigarettes, uncomplicated: Secondary | ICD-10-CM | POA: Insufficient documentation

## 2017-07-12 DIAGNOSIS — R7303 Prediabetes: Secondary | ICD-10-CM | POA: Insufficient documentation

## 2017-07-12 DIAGNOSIS — J029 Acute pharyngitis, unspecified: Secondary | ICD-10-CM

## 2017-07-12 LAB — RAPID STREP SCREEN (MED CTR MEBANE ONLY): STREPTOCOCCUS, GROUP A SCREEN (DIRECT): NEGATIVE

## 2017-07-12 MED ORDER — PREDNISONE 20 MG PO TABS: 40.0000 mg | ORAL_TABLET | Freq: Every day | ORAL | 0 refills | Status: DC

## 2017-07-12 NOTE — ED Notes (Signed)
Patient is in the room

## 2017-07-12 NOTE — ED Triage Notes (Signed)
Pt reports sore throat and fever since yesterday. Airway intact at triage.

## 2017-07-12 NOTE — ED Provider Notes (Signed)
[facility] Provider Note   CSN: [id number] Arrival date & time: 07/12/17  0905     History   Chief Complaint Chief Complaint  Patient presents with  . Sore Throat    HPI Trevor RightWilliam Casados Jr. is a 41 y.o. male.  Pt comes in with c/o sore throat and fever that started yesterday. She states that he also has a cough. He hasn't taken anything for the symptoms. No rash, abdominal pain or neck stiffness. No known exposure      Past Medical History:  Diagnosis Date  . Prediabetes     Patient Active Problem List   Diagnosis Date Noted  . Preventative health care 02/15/2017  . Smoker 02/15/2017  . Dysuria 02/15/2017  . Prediabetes     History reviewed. No pertinent surgical history.     Home Medications    Prior to Admission medications   Medication Sig Start Date End Date Taking? Authorizing Provider  cyclobenzaprine (FLEXERIL) 10 MG tablet Take 1 tablet (10 mg total) by mouth 3 (three) times daily as needed for muscle spasms. 06/27/17   Rise MuLeaphart, Kenneth T, PA-C  doxycycline (VIBRA-TABS) 100 MG tablet Take 1 tablet (100 mg total) by mouth 2 (two) times daily. Patient not taking: Reported on 06/27/2017 02/15/17   Corwin LevinsJohn, James W, MD  naproxen (NAPROSYN) 375 MG tablet Take 1 tablet (375 mg total) by mouth 2 (two) times daily. 06/27/17   Rise MuLeaphart, Kenneth T, PA-C  predniSONE (DELTASONE) 20 MG tablet Take 2 tablets (40 mg total) by mouth daily with breakfast. 07/12/17   Teressa LowerPickering, Lucette Kratz, NP    Family History Family History  Problem Relation Age of Onset  . Diabetes Other     Social History Social History  Substance Use Topics  . Smoking status: Current Some Day Smoker    Types: Cigarettes  . Smokeless tobacco: Never Used  . Alcohol use No     Allergies   Patient has no known allergies.   Review of Systems Review of Systems  All other systems reviewed and are negative.    Physical Exam Updated Vital Signs BP 121/81 (BP Location: Left Arm)    Pulse 75   Temp 98.2 F (36.8 C) (Oral)   Resp 18   SpO2 97%   Physical Exam  Constitutional: He is oriented to person, place, and time. He appears well-nourished.  HENT:  Right Ear: External ear normal.  Left Ear: External ear normal.  Mouth/Throat: Posterior oropharyngeal edema and posterior oropharyngeal erythema present. No oropharyngeal exudate or tonsillar abscesses.  Eyes: Conjunctivae are normal.  Neck: Normal range of motion. Neck supple.  Cardiovascular: Normal rate.   Pulmonary/Chest: Effort normal.  Musculoskeletal: Normal range of motion.  Neurological: He is alert and oriented to person, place, and time.  Skin: Skin is warm.  Nursing note and vitals reviewed.    ED Treatments / Results  Labs (all labs ordered are listed, but only abnormal results are displayed) Labs Reviewed  [facility])  CULTURE, GROUP A STREP [facility])    EKG  EKG Interpretation None       Radiology No results found.  Procedures Procedures (including critical care time)  Medications Ordered in ED Medications - No data to display   Initial Impression / Assessment and Plan / ED Course  I have reviewed the triage vital signs and the nursing notes.  Pertinent labs & imaging results that were available during my care of the patient were reviewed by me and considered  in my medical decision making (see chart for details).    Strep negative. Will treat symptomatically with steroids. Discussed return precautions with pt  Final Clinical Impressions(s) / ED Diagnoses   Final diagnoses:  Pharyngitis, unspecified etiology    New Prescriptions New Prescriptions   PREDNISONE (DELTASONE) 20 MG TABLET    Take 2 tablets (40 mg total) by mouth daily with breakfast.     Teressa Lower, NP 07/12/17 1035    Vanetta Mulders, MD 07/12/17 1041

## 2017-07-14 LAB — CULTURE, GROUP A STREP (THRC)

## 2017-12-26 ENCOUNTER — Encounter (HOSPITAL_COMMUNITY): Payer: Self-pay | Admitting: *Deleted

## 2017-12-26 ENCOUNTER — Other Ambulatory Visit: Payer: Self-pay

## 2017-12-26 ENCOUNTER — Emergency Department (HOSPITAL_COMMUNITY)
Admission: EM | Admit: 2017-12-26 | Discharge: 2017-12-26 | Disposition: A | Discharge status: Home / Self Care | Payer: Self-pay | Attending: Emergency Medicine | Admitting: Emergency Medicine

## 2017-12-26 ENCOUNTER — Emergency Department (HOSPITAL_COMMUNITY): Payer: Self-pay

## 2017-12-26 DIAGNOSIS — R1030 Lower abdominal pain, unspecified: Secondary | ICD-10-CM

## 2017-12-26 DIAGNOSIS — J029 Acute pharyngitis, unspecified: Secondary | ICD-10-CM

## 2017-12-26 DIAGNOSIS — F17210 Nicotine dependence, cigarettes, uncomplicated: Secondary | ICD-10-CM | POA: Insufficient documentation

## 2017-12-26 LAB — COMPREHENSIVE METABOLIC PANEL
ALK PHOS: 72 U/L (ref 38–126)
ALT: 44 U/L (ref 17–63)
AST: 45 U/L — ABNORMAL HIGH (ref 15–41)
Albumin: 4.1 g/dL (ref 3.5–5.0)
Anion gap: 10 (ref 5–15)
BUN: 15 mg/dL (ref 6–20)
CALCIUM: 9.2 mg/dL (ref 8.9–10.3)
CO2: 23 mmol/L (ref 22–32)
CREATININE: 0.85 mg/dL (ref 0.61–1.24)
Chloride: 106 mmol/L (ref 101–111)
GFR calc Af Amer: >60 mL/min (ref >60)
Glucose, Bld: 146 mg/dL — ABNORMAL HIGH (ref 65–99)
Potassium: 3.8 mmol/L (ref 3.5–5.1)
Sodium: 139 mmol/L (ref 135–145)
Total Bilirubin: 0.7 mg/dL (ref 0.3–1.2)
Total Protein: 7 g/dL (ref 6.5–8.1)

## 2017-12-26 LAB — URINALYSIS, ROUTINE W REFLEX MICROSCOPIC
Bilirubin Urine: NEGATIVE
Glucose, UA: NEGATIVE mg/dL
HGB URINE DIPSTICK: NEGATIVE
Ketones, ur: NEGATIVE mg/dL
LEUKOCYTES UA: NEGATIVE
Nitrite: NEGATIVE
PROTEIN: NEGATIVE mg/dL
Specific Gravity, Urine: 1.021 (ref 1.005–1.030)
pH: 7 (ref 5.0–8.0)

## 2017-12-26 LAB — CBC
HCT: 41.9 % (ref 39.0–52.0)
Hemoglobin: 13.3 g/dL (ref 13.0–17.0)
MCH: 22.3 pg — ABNORMAL LOW (ref 26.0–34.0)
MCHC: 31.7 g/dL (ref 30.0–36.0)
MCV: 70.3 fL — ABNORMAL LOW (ref 78.0–100.0)
PLATELETS: 289 10*3/uL (ref 150–400)
RBC: 5.96 MIL/uL — AB (ref 4.22–5.81)
RDW: 15.9 % — ABNORMAL HIGH (ref 11.5–15.5)
WBC: 7.7 10*3/uL (ref 4.0–10.5)

## 2017-12-26 LAB — RAPID STREP SCREEN (MED CTR MEBANE ONLY): STREPTOCOCCUS, GROUP A SCREEN (DIRECT): NEGATIVE

## 2017-12-26 LAB — LIPASE, BLOOD: Lipase: 26 U/L (ref 11–51)

## 2017-12-26 MED ORDER — SODIUM CHLORIDE 0.9 % IV BOLUS (SEPSIS)
1000.0000 mL | Freq: Once | INTRAVENOUS | Status: AC
  Administered 2017-12-26: 1000 mL via INTRAVENOUS

## 2017-12-26 MED ORDER — NYSTATIN 100000 UNIT/ML MT SUSP: 500000.0000 [IU] | Freq: Four times a day (QID) | OROMUCOSAL | 0 refills | Status: DC

## 2017-12-26 MED ORDER — IOPAMIDOL (ISOVUE-300) INJECTION 61%
INTRAVENOUS | Status: AC
  Administered 2017-12-26: 100 mL
  Filled 2017-12-26: qty 100

## 2017-12-26 NOTE — ED Notes (Signed)
Patient arrived to [id number]

## 2017-12-26 NOTE — ED Provider Notes (Signed)
[facility] Provider [facility]   CSN: [id number] Arrival date & time: 12/26/17  [id number]     History   Chief Complaint Chief Complaint  Patient presents with  . Abdominal Pain  . Sore Throat    HPI Trevor RightWilliam Godbolt Jr. is a 42 y.o. male.  Patient is a 42 year old male who is relatively healthy presenting today with 6 days of persistent dry lips, sore throat and abdominal pain in the left side of his abdomen.  He has had some loose stool and occasionally will have burning at the end of his urinary stream.  Patient has been drinking at least 7 bottles of water per day and juices but is not feeling any better.  He states approximately a week before the symptoms started he had the flu which improved with time and taking NyQuil.  He has not been on any antibiotics, recent travel or any other known sick contacts.  He is not currently taking any prescription medications.  He has had no prior abdominal surgeries.  When discussing his dysuria it sounds like this is an ongoing issue and nothing that started in the last 6 days.  He states in the first few days of his illness he was hot and cold and having chills but that has since resolved.  Eating does not seem to affect his pain but he has not had a great appetite.   The history is provided by the patient.  Abdominal Pain   This is a new problem. Episode onset: 6 days. The problem occurs constantly. The problem has been gradually worsening. The pain is associated with an unknown factor. The pain is located in the LLQ, LUQ and suprapubic region. The pain is at a severity of 6/10. The pain is moderate. Associated symptoms include fever, diarrhea, dysuria and headaches. Pertinent negatives include nausea and vomiting. Associated symptoms comments: Sore throat.  States sometimes at the end of urinary stream he will have some burning. The symptoms are aggravated by palpation. Nothing relieves the symptoms. Past medical  history comments: hx of dysuria and prediabetes.  Sore Throat  Associated symptoms include abdominal pain and headaches.    Past Medical History:  Diagnosis Date  . Prediabetes     Patient Active Problem List   Diagnosis Date Noted  . Preventative health care 02/15/2017  . Smoker 02/15/2017  . Dysuria 02/15/2017  . Prediabetes     History reviewed. No pertinent surgical history.     Home Medications    Prior to Admission medications   Medication Sig Start Date End Date Taking? Authorizing Provider  cyclobenzaprine (FLEXERIL) 10 MG tablet Take 1 tablet (10 mg total) by mouth 3 (three) times daily as needed for muscle spasms. 06/27/17   Rise MuLeaphart, Kenneth T, PA-C  doxycycline (VIBRA-TABS) 100 MG tablet Take 1 tablet (100 mg total) by mouth 2 (two) times daily. Patient not taking: Reported on 06/27/2017 02/15/17   Corwin LevinsJohn, James W, MD  naproxen (NAPROSYN) 375 MG tablet Take 1 tablet (375 mg total) by mouth 2 (two) times daily. 06/27/17   Rise MuLeaphart, Kenneth T, PA-C  predniSONE (DELTASONE) 20 MG tablet Take 2 tablets (40 mg total) by mouth daily with breakfast. 07/12/17   Teressa LowerPickering, Vrinda, NP    Family History Family History  Problem Relation Age of Onset  . Diabetes Other     Social History Social History   Tobacco Use  . Smoking status: Current Some Day Smoker    Types: Cigarettes  . Smokeless  tobacco: Never Used  Substance Use Topics  . Alcohol use: No    Alcohol/week: 0.0 oz  . Drug use: No     Allergies   Patient has no known allergies.   Review of Systems Review of Systems  Constitutional: Positive for fever.  Respiratory:       Also complains of feeling winded when he gets up to do anything but no shortness of breath at rest.  No chest pain  Gastrointestinal: Positive for abdominal pain and diarrhea. Negative for nausea and vomiting.  Genitourinary: Positive for dysuria.  Neurological: Positive for headaches.  All other systems reviewed and are  negative.    Physical Exam Updated Vital Signs BP 114/76 (BP Location: Right Arm)   Pulse 63   Temp 98.4 F (36.9 C) (Oral)   Resp 18   Ht 5\' 8"  (1.727 m)   Wt 101.2 kg (223 lb)   SpO2 96%   BMI 33.91 kg/m   Physical Exam  Constitutional: He is oriented to person, place, and time. He appears well-developed and well-nourished. No distress.  HENT:  Head: Normocephalic and atraumatic.  Mouth/Throat: Mucous membranes are dry. No oral lesions. Posterior oropharyngeal erythema present.  Eyes: Conjunctivae and EOM are normal. Pupils are equal, round, and reactive to light.  Neck: Normal range of motion. Neck supple. No Brudzinski's sign and no Kernig's sign noted.  Cardiovascular: Normal rate, regular rhythm and intact distal pulses.  No murmur heard. Pulmonary/Chest: Effort normal and breath sounds normal. No respiratory distress. He has no wheezes. He has no rales.  Abdominal: Soft. He exhibits no distension. There is tenderness in the suprapubic area and left lower quadrant. There is guarding. There is no rebound.    Musculoskeletal: Normal range of motion. He exhibits no edema or tenderness.  Neurological: He is alert and oriented to person, place, and time.  Skin: Skin is warm and dry. No rash noted. No erythema.  Psychiatric: He has a normal mood and affect. His behavior is normal.  Nursing note and vitals reviewed.    ED Treatments / Results  Labs (all labs ordered are listed, but only abnormal results are displayed) Labs Reviewed  COMPREHENSIVE METABOLIC PANEL - Abnormal; Notable for the following components:      Result Value   Glucose, Bld 146 (*)    AST 45 (*)    All other components within normal limits  CBC - Abnormal; Notable for the following components:   RBC 5.96 (*)    MCV 70.3 (*)    MCH 22.3 (*)    RDW 15.9 (*)    All other components within normal limits  RAPID STREP SCREEN (NOT AT [facility])  CULTURE, GROUP A STREP ([facility])  LIPASE, BLOOD  URINALYSIS,  ROUTINE W REFLEX MICROSCOPIC    EKG  EKG Interpretation None       Radiology Ct Abdomen Pelvis W Contrast  Result Date: 12/26/2017 CLINICAL DATA:  Left lower quadrant and mid abdominal pain for 2 weeks. EXAM: CT ABDOMEN AND PELVIS WITH CONTRAST TECHNIQUE: Multidetector CT imaging of the abdomen and pelvis was performed using the standard protocol following bolus administration of intravenous contrast. CONTRAST:  ISOVUE-300 IOPAMIDOL (ISOVUE-300) INJECTION 61% COMPARISON:  None. FINDINGS: Lower chest: Lung bases are clear. No effusions. Heart is normal size. Hepatobiliary: No focal hepatic abnormality. Gallbladder unremarkable. Pancreas: No focal abnormality or ductal dilatation. Spleen: No focal abnormality.  Normal size. Adrenals/Urinary Tract: No adrenal abnormality. No focal renal abnormality. No stones or hydronephrosis. Urinary bladder  is unremarkable. Stomach/Bowel: Normal appendix. Stomach, large and small bowel grossly unremarkable. Vascular/Lymphatic: No evidence of aneurysm or adenopathy. Reproductive: No visible focal abnormality. Other: No free fluid or free air. Musculoskeletal: No acute bony abnormality. IMPRESSION: No acute findings in the abdomen or pelvis. Normal appendix. No explanation for the patient's left lower quadrant abdominal pain. Electronically Signed   By: Charlett Nose M.D.   On: 12/26/2017 13:53    Procedures Procedures (including critical care time)  Medications Ordered in ED Medications  sodium chloride 0.9 % bolus 1,000 mL (not administered)     Initial Impression / Assessment and Plan / ED Course  I have reviewed the triage vital signs and the nursing notes.  Pertinent labs & imaging results that were available during my care of the patient were reviewed by me and considered in my medical decision making (see chart for details).     Patient presenting with vague symptoms of illness that have been ongoing for the last 6 days.  Patient states he  had the flu approximately 1 week before the symptoms started.  He has had persistently worsening abdominal pain mostly on the left side and some loose stool.  On exam patient has some mild erythema of his throat and states he has had a cough and some mild congestion but nothing that appears to be strep throat and his swab was negative.  His mouth is dry but no other acute findings.  Heart and lungs without acute findings patient's heart rate and oxygen saturation is within normal limits.  Patient has a normal blood pressure and temperature at this time.  He does have abdominal pain with some mild guarding in the left lower quadrant with concern for possible colitis or diverticulitis.  Lower suspicion for appendicitis and labs are negative for pancreatitis or significant liver disease.  Patient's urine is clear and low suspicion for kidney stone or UTI.  Patient has been sexually active with only his wife for a number of years and is at low risk for HIV or other STI. CBC, CMP, lipase, UA all without acute findings.  We will do a CT to ensure patient is not have diverticulitis.  He was given IV fluids.  2:18 PM CT was negative.  Will give patient some Magic mouthwash.  May be this is a lingering viral event or possible mild early thrush.  Encourage patient to follow-up with his PCP.  Final Clinical Impressions(s) / ED Diagnoses   Final diagnoses:  Lower abdominal pain  Sore throat    ED Discharge Orders        Ordered    nystatin (MYCOSTATIN) 100000 UNIT/ML suspension  4 times daily     12/26/17 1419       Gwyneth Sprout, MD 12/26/17 1419

## 2017-12-26 NOTE — ED Notes (Signed)
Patient transported to CT 

## 2017-12-26 NOTE — ED Notes (Signed)
D/c reviewed with patient. No further questions at this time 

## 2017-12-26 NOTE — ED Notes (Addendum)
Patient reports having stomach pain since yesterday, he had a bowel movement today, reports that BM was "not solid or hard". Patient also reports sore throat

## 2017-12-26 NOTE — ED Triage Notes (Signed)
Pt in c/o sore throat, mouth sores and swelling, difficulty urinating since yesterday morning, states he has not been consuming many fluids, attempted to urinate this morning and could not, c/o left flank pain, no distress noted

## 2017-12-27 MED FILL — NYSTATIN 100,000 UNITS/ML S: 100000 | 3 days supply | Qty: 60 | Fill #0

## 2017-12-28 LAB — CULTURE, GROUP A STREP (THRC)

## 2018-01-17 ENCOUNTER — Other Ambulatory Visit: Payer: Self-pay

## 2018-01-17 ENCOUNTER — Emergency Department (HOSPITAL_COMMUNITY)
Admission: EM | Admit: 2018-01-17 | Discharge: 2018-01-17 | Disposition: A | Discharge status: Home / Self Care | Payer: Self-pay | Attending: Emergency Medicine | Admitting: Emergency Medicine

## 2018-01-17 ENCOUNTER — Encounter (HOSPITAL_COMMUNITY): Payer: Self-pay | Admitting: Emergency Medicine

## 2018-01-17 ENCOUNTER — Emergency Department (HOSPITAL_COMMUNITY): Payer: Self-pay

## 2018-01-17 DIAGNOSIS — Z79899 Other long term (current) drug therapy: Secondary | ICD-10-CM | POA: Insufficient documentation

## 2018-01-17 DIAGNOSIS — M25511 Pain in right shoulder: Secondary | ICD-10-CM | POA: Insufficient documentation

## 2018-01-17 DIAGNOSIS — W19XXXA Unspecified fall, initial encounter: Secondary | ICD-10-CM | POA: Insufficient documentation

## 2018-01-17 DIAGNOSIS — F17210 Nicotine dependence, cigarettes, uncomplicated: Secondary | ICD-10-CM | POA: Insufficient documentation

## 2018-01-17 DIAGNOSIS — Y9389 Activity, other specified: Secondary | ICD-10-CM | POA: Insufficient documentation

## 2018-01-17 DIAGNOSIS — Y929 Unspecified place or not applicable: Secondary | ICD-10-CM | POA: Insufficient documentation

## 2018-01-17 DIAGNOSIS — Y999 Unspecified external cause status: Secondary | ICD-10-CM | POA: Insufficient documentation

## 2018-01-17 DIAGNOSIS — S20211A Contusion of right front wall of thorax, initial encounter: Secondary | ICD-10-CM | POA: Insufficient documentation

## 2018-01-17 MED ORDER — OXYCODONE-ACETAMINOPHEN 5-325 MG PO TABS: 1.0000 | ORAL_TABLET | ORAL | 0 refills | Status: DC | PRN

## 2018-01-17 MED ORDER — IBUPROFEN 600 MG PO TABS: 600.0000 mg | ORAL_TABLET | Freq: Four times a day (QID) | ORAL | 0 refills | Status: DC | PRN

## 2018-01-17 NOTE — ED Triage Notes (Signed)
Pt c/o 10/10 right shoulder pain after he fell last night around 1900. And right rib cage pain.

## 2018-01-17 NOTE — ED Provider Notes (Signed)
[facility] Provider Note   CSN: [id number] Arrival date & time: 01/17/18  [id number]     History   Chief Complaint Chief Complaint  Patient presents with  . Shoulder Pain    HPI Trevor RightWilliam Mcglothen Jr. is a 42 y.o. male.  Patient presents with severe right shoulder and right chest wall pain since a fall yesterday. No head injury, neck or abdominal pain. He fell onto his right side with arm adducted. No other injury. He has been taking a previously prescribed medication at home but cannot remember the name without relief.    The history is provided by the patient. No language interpreter was used.  Shoulder Pain   Pertinent negatives include no numbness.    Past Medical History:  Diagnosis Date  . Prediabetes     Patient Active Problem List   Diagnosis Date Noted  . Preventative health care 02/15/2017  . Smoker 02/15/2017  . Dysuria 02/15/2017  . Prediabetes     History reviewed. No pertinent surgical history.     Home Medications    Prior to Admission medications   Medication Sig Start Date End Date Taking? Authorizing Provider  cyclobenzaprine (FLEXERIL) 10 MG tablet Take 1 tablet (10 mg total) by mouth 3 (three) times daily as needed for muscle spasms. Patient not taking: Reported on 12/26/2017 06/27/17   Demetrios LollLeaphart, Kenneth T, PA-C  doxycycline (VIBRA-TABS) 100 MG tablet Take 1 tablet (100 mg total) by mouth 2 (two) times daily. Patient not taking: Reported on 06/27/2017 02/15/17   Corwin LevinsJohn, James W, MD  naproxen (NAPROSYN) 375 MG tablet Take 1 tablet (375 mg total) by mouth 2 (two) times daily. Patient not taking: Reported on 12/26/2017 06/27/17   Demetrios LollLeaphart, Kenneth T, PA-C  nystatin (MYCOSTATIN) 100000 UNIT/ML suspension Take 5 mLs (500,000 Units total) by mouth 4 (four) times daily. 12/26/17   Gwyneth SproutPlunkett, Whitney, MD  predniSONE (DELTASONE) 20 MG tablet Take 2 tablets (40 mg total) by mouth daily with breakfast. Patient not taking: Reported  on 12/26/2017 07/12/17   Teressa LowerPickering, Vrinda, NP    Family History Family History  Problem Relation Age of Onset  . Diabetes Other     Social History Social History   Tobacco Use  . Smoking status: Current Some Day Smoker    Types: Cigarettes  . Smokeless tobacco: Never Used  Substance Use Topics  . Alcohol use: No    Alcohol/week: 0.0 oz  . Drug use: No     Allergies   Patient has no known allergies.   Review of Systems Review of Systems  Constitutional: Negative for chills and fever.  Respiratory: Negative.  Negative for cough and shortness of breath.        Right anterior chest hurts when breathing. No SOB, cough.  Cardiovascular: Positive for chest pain (Right anterior chest pain).  Gastrointestinal: Negative.  Negative for abdominal pain and nausea.  Musculoskeletal: Negative for back pain and neck pain.       See HPI  Skin: Negative.  Negative for color change and wound.  Neurological: Negative.  Negative for weakness and numbness.     Physical Exam Updated Vital Signs BP (!) 162/84 (BP Location: Left Arm)   Pulse 96   Temp 98.1 F (36.7 C) (Oral)   Resp 18   Ht 5\' 8"  (1.727 m)   Wt 101.2 kg (223 lb)   SpO2 96%   BMI 33.91 kg/m   Physical Exam  Constitutional: He appears well-developed and well-nourished.  HENT:  Head: Normocephalic.  Neck: Normal range of motion. Neck supple.  Cardiovascular: Normal rate and regular rhythm.  Pulmonary/Chest: Effort normal and breath sounds normal. No respiratory distress. He exhibits tenderness (Right anterolateral tenderness without bruising or swelling.).  Full air movement to right lobes.   Abdominal: Soft. Bowel sounds are normal. There is no tenderness. There is no rebound and no guarding.  Musculoskeletal: Normal range of motion.  Right shoulder most tender over anterior AC joint. No deformity. FROM with increased pain on lateral abduction. No midline or paracervical tenderness. Grip strength 5/5 and symmetric  in bilateral UE's.   Neurological: He is alert. No cranial nerve deficit.  Skin: Skin is warm and dry. No rash noted.  Psychiatric: He has a normal mood and affect.     ED Treatments / Results  Labs (all labs ordered are listed, but only abnormal results are displayed) Labs Reviewed - No data to display  EKG  EKG Interpretation None       Radiology Dg Chest 2 View  Result Date: 01/17/2018 CLINICAL DATA:  Injury EXAM: CHEST - 2 VIEW COMPARISON:  None. FINDINGS: The heart size and mediastinal contours are within normal limits. Both lungs are clear. The visualized skeletal structures are unremarkable. IMPRESSION: No active cardiopulmonary disease. Electronically Signed   By: Jasmine Pang M.D.   On: 01/17/2018 02:41   Dg Shoulder Right  Result Date: 01/17/2018 CLINICAL DATA:  Injury EXAM: RIGHT SHOULDER - 2+ VIEW COMPARISON:  06/03/2013 FINDINGS: Mild inferior glenohumeral degenerative change. No acute fracture or malalignment. AC joint appears intact. IMPRESSION: No acute osseous abnormality. Electronically Signed   By: Jasmine Pang M.D.   On: 01/17/2018 02:42    Procedures Procedures (including critical care time)  Medications Ordered in ED Medications - No data to display   Initial Impression / Assessment and Plan / ED Course  I have reviewed the triage vital signs and the nursing notes.  Pertinent labs & imaging results that were available during my care of the patient were reviewed by me and considered in my medical decision making (see chart for details).     Patient here for evaluation after fall yesterday with right shoulder and chest pain.   Imaging is negative for fracture or shoulder joint separation. Likely musculoskeletal pain with possible ligamentous injury. Will treat with ibuprofen and Percocet, recommending ortho follow up if no improvement in 4-5 days.  Final Clinical Impressions(s) / ED Diagnoses   Final diagnoses:  None   1. Right shoulder pain 2.  Chest wall pain 3. Fall   ED Discharge Orders    None       Elpidio Anis, PA-C 01/17/18 0430    Shon Baton, MD 01/17/18 0630

## 2018-02-16 ENCOUNTER — Ambulatory Visit: Payer: Self-pay | Admitting: Internal Medicine

## 2018-03-16 ENCOUNTER — Encounter (HOSPITAL_COMMUNITY): Payer: Self-pay | Admitting: Emergency Medicine

## 2018-03-16 ENCOUNTER — Ambulatory Visit (HOSPITAL_COMMUNITY): Admission: EM | Admit: 2018-03-16 | Discharge: 2018-03-16 | Discharge status: Absent without leave | Payer: Self-pay

## 2018-03-16 ENCOUNTER — Ambulatory Visit (HOSPITAL_COMMUNITY)
Admission: EM | Admit: 2018-03-16 | Discharge: 2018-03-16 | Disposition: A | Discharge status: Home / Self Care | Payer: Self-pay | Attending: Emergency Medicine | Admitting: Emergency Medicine

## 2018-03-16 DIAGNOSIS — M25511 Pain in right shoulder: Secondary | ICD-10-CM

## 2018-03-16 MED ORDER — PREDNISONE 50 MG PO TABS: 50.0000 mg | ORAL_TABLET | Freq: Every day | ORAL | 0 refills | Status: AC

## 2018-03-16 MED ORDER — CYCLOBENZAPRINE HCL 10 MG PO TABS: 10.0000 mg | ORAL_TABLET | Freq: Three times a day (TID) | ORAL | 0 refills | Status: DC | PRN

## 2018-03-16 MED ORDER — IBUPROFEN 800 MG PO TABS: 800.0000 mg | ORAL_TABLET | Freq: Four times a day (QID) | ORAL | 0 refills | Status: DC | PRN

## 2018-03-16 NOTE — Discharge Instructions (Signed)
Prednisone daily for 5 days with food  Ibuprofen 800 every 6-8 hours as needed with food on stomach, may supplement with Tylenol  May use Flexeril as needed, please reserve for use at home or bedtime as this may cause sedation.  Follow-up with orthopedics for further evaluation and treatment of your shoulder pain.

## 2018-03-16 NOTE — ED Triage Notes (Signed)
Pt states he had a fall a few months ago and went to the ER and had xrays done, pt still c/o ongoing R shoulder pain.

## 2018-03-16 NOTE — ED Provider Notes (Signed)
[facility]    CSN: [id number] Arrival date & time: 03/16/18  1038     History   Chief Complaint Chief Complaint  Patient presents with  . Shoulder Pain    HPI Trevor Wallace. is a 42 y.o. male lower extremity past medical history presenting today for evaluation of right shoulder pain.  Patient states that the beginning of March, approximately 2 months ago he fell as he was chasing his son.  He landed directly on his shoulder.  X-rays were taken in the emergency room left shoulder and chest revealing no fracture or dislocation.  Since he has had persistent pain.  Pain is worse with overhead movements.  States he has a burning pain that he feels like is coming from his neck.  Denies numbness or tingling.  Also concerned because his right proximal clavicle is now sticking out of this more than the left as well as it is associated with some minor discomfort.  HPI  Past Medical History:  Diagnosis Date  . Prediabetes     Patient Active Problem List   Diagnosis Date Noted  . Preventative health care 02/15/2017  . Smoker 02/15/2017  . Dysuria 02/15/2017  . Prediabetes     History reviewed. No pertinent surgical history.     Home Medications    Prior to Admission medications   Medication Sig Start Date End Date Taking? Authorizing Provider  cyclobenzaprine (FLEXERIL) 10 MG tablet Take 1 tablet (10 mg total) by mouth 3 (three) times daily as needed for muscle spasms. 03/16/18   Christen Wardrop C, PA-C  doxycycline (VIBRA-TABS) 100 MG tablet Take 1 tablet (100 mg total) by mouth 2 (two) times daily. Patient not taking: Reported on 06/27/2017 02/15/17   Corwin Levins, MD  ibuprofen (ADVIL,MOTRIN) 800 MG tablet Take 1 tablet (800 mg total) by mouth every 6 (six) hours as needed. 03/16/18   Betsie Peckman C, PA-C  naproxen (NAPROSYN) 375 MG tablet Take 1 tablet (375 mg total) by mouth 2 (two) times daily. Patient not taking: Reported on 12/26/2017 06/27/17   Demetrios Loll T, PA-C  nystatin (MYCOSTATIN) 100000 UNIT/ML suspension Take 5 mLs (500,000 Units total) by mouth 4 (four) times daily. 12/26/17   Gwyneth Sprout, MD  oxyCODONE-acetaminophen (PERCOCET/ROXICET) 5-325 MG tablet Take 1 tablet by mouth every 4 (four) hours as needed for severe pain. Patient not taking: Reported on 03/16/2018 01/17/18   Elpidio Anis, PA-C  predniSONE (DELTASONE) 50 MG tablet Take 1 tablet (50 mg total) by mouth daily for 5 days. 03/16/18 03/21/18  Oveda Dadamo, Junius Creamer, PA-C    Family History Family History  Problem Relation Age of Onset  . Diabetes Other     Social History Social History   Tobacco Use  . Smoking status: Current Some Day Smoker    Types: Cigarettes  . Smokeless tobacco: Never Used  Substance Use Topics  . Alcohol use: No    Alcohol/week: 0.0 oz  . Drug use: No     Allergies   Patient has no known allergies.   Review of Systems Review of Systems  Constitutional: Negative for activity change, appetite change and fever.  HENT: Negative for trouble swallowing.   Eyes: Negative for visual disturbance.  Respiratory: Negative for shortness of breath.   Cardiovascular: Negative for chest pain.  Gastrointestinal: Negative for abdominal pain, nausea and vomiting.  Musculoskeletal: Positive for arthralgias, myalgias and neck pain. Negative for gait problem and neck stiffness.  Skin: Negative for color change, rash  and wound.  Neurological: Negative for dizziness, syncope, speech difficulty, weakness, light-headedness, numbness and headaches.     Physical Exam Triage Vital Signs ED Triage Vitals  Enc Vitals Group     BP 03/16/18 1056 134/80     Pulse Rate 03/16/18 1056 81     Resp 03/16/18 1056 16     Temp 03/16/18 1056 98.2 F (36.8 C)     Temp src --      SpO2 03/16/18 1056 100 %     Weight --      Height --      Head Circumference --      Peak Flow --      Pain Score 03/16/18 1206 0     Pain Loc --      Pain Edu? --      Excl. in  GC? --    No data found.  Updated Vital Signs BP 134/80   Pulse 81   Temp 98.2 F (36.8 C)   Resp 16   SpO2 100%   Visual Acuity Right Eye Distance:   Left Eye Distance:   Bilateral Distance:    Right Eye Near:   Left Eye Near:    Bilateral Near:     Physical Exam  Constitutional: He appears well-developed and well-nourished.  HENT:  Head: Normocephalic and atraumatic.  Eyes: Conjunctivae are normal.  Neck: Neck supple.  Cardiovascular: Normal rate and regular rhythm.  No murmur heard. Pulmonary/Chest: Effort normal and breath sounds normal. No respiratory distress.  Abdominal: Soft. There is no tenderness.  Musculoskeletal: He exhibits no edema.  Right clavicle does appear slightly more prominent anterior sternoclavicular joint compared to left, mild tenderness with palpation, tenderness to palpation of supraspinatus muscle as well as over trapezius muscle.  Full active range of motion of right shoulder although pain is elicited once flexion over 90 degrees. Positive Hawkins and empty can, negative liftoff, resisted external rotation and Yergason's.  Neurological: He is alert.  Skin: Skin is warm and dry.  Psychiatric: He has a normal mood and affect.  Nursing note and vitals reviewed.    UC Treatments / Results  Labs (all labs ordered are listed, but only abnormal results are displayed) Labs Reviewed - No data to display  EKG None  Radiology No results found.  Procedures Procedures (including critical care time)  Medications Ordered in UC Medications - No data to display  Initial Impression / Assessment and Plan / UC Course  I have reviewed the triage vital signs and the nursing notes.  Pertinent labs & imaging results that were available during my care of the patient were reviewed by me and considered in my medical decision making (see chart for details).     Patient with persistent shoulder pain, possible impingement.  Will do short course of  prednisone, ibuprofen 800, Flexeril as needed.  Follow-up with orthopedics for further evaluation and treatment.  May benefit from physical therapy. Discussed strict return precautions. Patient verbalized understanding and is agreeable with plan.  Final Clinical Impressions(s) / UC Diagnoses   Final diagnoses:  Acute pain of right shoulder     Discharge Instructions     Prednisone daily for 5 days with food  Ibuprofen 800 every 6-8 hours as needed with food on stomach, may supplement with Tylenol  May use Flexeril as needed, please reserve for use at home or bedtime as this may cause sedation.  Follow-up with orthopedics for further evaluation and treatment of your shoulder pain.  ED Prescriptions    Medication Sig Dispense Auth. Provider   cyclobenzaprine (FLEXERIL) 10 MG tablet Take 1 tablet (10 mg total) by mouth 3 (three) times daily as needed for muscle spasms. 20 tablet Salsabeel Gorelick C, PA-C   ibuprofen (ADVIL,MOTRIN) 800 MG tablet Take 1 tablet (800 mg total) by mouth every 6 (six) hours as needed. 30 tablet Albie Bazin C, PA-C   predniSONE (DELTASONE) 50 MG tablet Take 1 tablet (50 mg total) by mouth daily for 5 days. 5 tablet Harlean Regula C, PA-C     Controlled Substance Prescriptions [place] Controlled Substance Registry consulted? Not Applicable   Lew Dawes, [place] 03/16/18 1222

## 2018-03-19 ENCOUNTER — Encounter: Payer: Self-pay | Admitting: Internal Medicine

## 2019-01-31 ENCOUNTER — Encounter (HOSPITAL_COMMUNITY): Payer: Self-pay | Admitting: Emergency Medicine

## 2019-01-31 ENCOUNTER — Ambulatory Visit (HOSPITAL_COMMUNITY)
Admission: EM | Admit: 2019-01-31 | Discharge: 2019-01-31 | Disposition: A | Discharge status: Home / Self Care | Payer: Self-pay | Attending: Family Medicine | Admitting: Family Medicine

## 2019-01-31 DIAGNOSIS — R21 Rash and other nonspecific skin eruption: Secondary | ICD-10-CM

## 2019-01-31 MED ORDER — PREDNISONE 10 MG (21) PO TBPK: ORAL_TABLET | Freq: Every day | ORAL | 0 refills | Status: DC

## 2019-01-31 MED ORDER — PERMETHRIN 5 % EX CREA: TOPICAL_CREAM | CUTANEOUS | 1 refills | Status: DC

## 2019-01-31 NOTE — ED Notes (Signed)
Patient verbalizes understanding of discharge instructions. Opportunity for questioning and answers were provided. Patient discharged from [facility] by MD. 

## 2019-01-31 NOTE — ED Triage Notes (Signed)
Pt states he had bed bugs and noticed it yesterday and sprayed his body with chemicals. Pt has rash on body and irritation.

## 2019-02-05 NOTE — ED Provider Notes (Signed)
[facility]   [id number] 01/31/19 Arrival Time: 1724  ASSESSMENT & PLAN:  1. Rash and nonspecific skin eruption    I do not see any obvious lesions on his extremities. Just a few very slight and very small erythematous spots. He is convinced he has seen bugs on his arms. Discussed empiric tx of scabies. Hopefully prednisone with help with his itching.  Meds ordered this encounter  Medications  . predniSONE (STERAPRED UNI-PAK 21 TAB) 10 MG (21) TBPK tablet    Sig: Take by mouth daily. Take as directed.    Dispense:  21 tablet    Refill:  0  . permethrin (ELIMITE) 5 % cream    Sig: Apply from neck down before bed then wash off in the morning. May repeat in one week.    Dispense:  60 g    Refill:  1    Will follow up with PCP or here if worsening or failing to improve as anticipated. Reviewed expectations re: course of current medical issues. Questions answered. Outlined signs and symptoms indicating need for more acute intervention. Patient verbalized understanding. After Visit Summary given.   SUBJECTIVE:  Trevor Wallace. is a 43 y.o. male who presents with a skin complaint.   Location: extremities Onset: abrupt; reports noticing after sleeping at a friend's house Duration: a few days Associated pruritis? "intense" Associated pain? none Progression: stable  Drainage? No  Known trigger? No  New soaps/lotions/topicals/detergents/environmental exposures? No Contacts with similar? No Recent travel? No  Other associated symptoms: none Therapies tried thus far: none Arthralgia or myalgia? none Recent illness? none Fever? none No specific aggravating or alleviating factors reported.  ROS: As per HPI.  OBJECTIVE: Vitals:   01/31/19 1739  BP: 121/77  Pulse: 84  Resp: 16  Temp: (!) 97.5 F (36.4 C)  SpO2: 97%    General appearance: alert; no distress Lungs: clear to auscultation bilaterally Heart: regular rate and rhythm Extremities: no edema  Skin: warm and dry; signs of infection: no; overall, skin appears normal without lesions, very few and scattered solitary 3-4 mm erythematous areas on his arms Psychological: alert and cooperative; normal mood and affect  No Known Allergies  Past Medical History:  Diagnosis Date  . Prediabetes    Social History   Socioeconomic History  . Marital status: Single    Spouse name: Not on file  . Number of children: Not on file  . Years of education: Not on file  . Highest education level: Not on file  Occupational History  . Not on file  Social Needs  . Financial resource strain: Not on file  . Food insecurity:    Worry: Not on file    Inability: Not on file  . Transportation needs:    Medical: Not on file    Non-medical: Not on file  Tobacco Use  . Smoking status: Current Some Day Smoker    Types: Cigarettes  . Smokeless tobacco: Never Used  Substance and Sexual Activity  . Alcohol use: No    Alcohol/week: 0.0 standard drinks  . Drug use: No  . Sexual activity: Yes  Lifestyle  . Physical activity:    Days per week: Not on file    Minutes per session: Not on file  . Stress: Not on file  Relationships  . Social connections:    Talks on phone: Not on file    Gets together: Not on file    Attends religious service: Not on file  Active member of club or organization: Not on file    Attends meetings of clubs or organizations: Not on file    Relationship status: Not on file  . Intimate partner violence:    Fear of current or ex partner: Not on file    Emotionally abused: Not on file    Physically abused: Not on file    Forced sexual activity: Not on file  Other Topics Concern  . Not on file  Social History Narrative  . Not on file   Family History  Problem Relation Age of Onset  . Diabetes Other    History reviewed. No pertinent surgical history.   Mardella Layman, MD 02/05/19 [phone]

## 2019-09-27 ENCOUNTER — Encounter: Payer: Self-pay | Admitting: Internal Medicine

## 2019-09-27 ENCOUNTER — Ambulatory Visit (INDEPENDENT_AMBULATORY_CARE_PROVIDER_SITE_OTHER): Payer: Self-pay | Admitting: Internal Medicine

## 2019-09-27 ENCOUNTER — Telehealth: Payer: Self-pay | Admitting: Emergency Medicine

## 2019-09-27 ENCOUNTER — Encounter (HOSPITAL_COMMUNITY): Payer: Self-pay

## 2019-09-27 ENCOUNTER — Other Ambulatory Visit: Payer: Self-pay

## 2019-09-27 ENCOUNTER — Ambulatory Visit (HOSPITAL_COMMUNITY)
Admission: EM | Admit: 2019-09-27 | Discharge: 2019-09-27 | Disposition: A | Discharge status: Home / Self Care | Payer: Self-pay | Attending: Emergency Medicine | Admitting: Emergency Medicine

## 2019-09-27 DIAGNOSIS — R7989 Other specified abnormal findings of blood chemistry: Secondary | ICD-10-CM | POA: Insufficient documentation

## 2019-09-27 DIAGNOSIS — R109 Unspecified abdominal pain: Secondary | ICD-10-CM

## 2019-09-27 DIAGNOSIS — R1032 Left lower quadrant pain: Secondary | ICD-10-CM | POA: Insufficient documentation

## 2019-09-27 DIAGNOSIS — R7303 Prediabetes: Secondary | ICD-10-CM

## 2019-09-27 LAB — CBC WITH DIFFERENTIAL/PLATELET
Abs Immature Granulocytes: 0.03 10*3/uL (ref 0.00–0.07)
Basophils Absolute: 0.1 10*3/uL (ref 0.0–0.1)
Basophils Relative: 1 %
Eosinophils Absolute: 0.1 10*3/uL (ref 0.0–0.5)
Eosinophils Relative: 1 %
HCT: 44.3 % (ref 39.0–52.0)
Hemoglobin: 13.5 g/dL (ref 13.0–17.0)
Immature Granulocytes: 0 %
Lymphocytes Relative: 32 %
Lymphs Abs: 2.4 10*3/uL (ref 0.7–4.0)
MCH: 21.7 pg — ABNORMAL LOW (ref 26.0–34.0)
MCHC: 30.5 g/dL (ref 30.0–36.0)
MCV: 71.3 fL — ABNORMAL LOW (ref 80.0–100.0)
Monocytes Absolute: 0.8 10*3/uL (ref 0.1–1.0)
Monocytes Relative: 11 %
Neutro Abs: 4.1 10*3/uL (ref 1.7–7.7)
Neutrophils Relative %: 55 %
Platelets: 373 10*3/uL (ref 150–400)
RBC: 6.21 MIL/uL — ABNORMAL HIGH (ref 4.22–5.81)
RDW: 17.7 % — ABNORMAL HIGH (ref 11.5–15.5)
WBC: 7.5 10*3/uL (ref 4.0–10.5)
nRBC: 0 % (ref 0.0–0.2)

## 2019-09-27 LAB — COMPREHENSIVE METABOLIC PANEL
ALT: 64 U/L — ABNORMAL HIGH (ref 0–44)
AST: 42 U/L — ABNORMAL HIGH (ref 15–41)
Albumin: 3.8 g/dL (ref 3.5–5.0)
Alkaline Phosphatase: 111 U/L (ref 38–126)
Anion gap: 9 (ref 5–15)
BUN: 14 mg/dL (ref 6–20)
CO2: 27 mmol/L (ref 22–32)
Calcium: 9.2 mg/dL (ref 8.9–10.3)
Chloride: 105 mmol/L (ref 98–111)
Creatinine, Ser: 0.81 mg/dL (ref 0.61–1.24)
GFR calc Af Amer: >60 mL/min (ref >60)
GFR calc non Af Amer: >60 mL/min (ref >60)
Glucose, Bld: 95 mg/dL (ref 70–99)
Potassium: 4.1 mmol/L (ref 3.5–5.1)
Sodium: 141 mmol/L (ref 135–145)
Total Bilirubin: 0.7 mg/dL (ref 0.3–1.2)
Total Protein: 6.4 g/dL — ABNORMAL LOW (ref 6.5–8.1)

## 2019-09-27 LAB — POCT URINALYSIS DIP (DEVICE)
Bilirubin Urine: NEGATIVE
Glucose, UA: NEGATIVE mg/dL
Ketones, ur: NEGATIVE mg/dL
Leukocytes,Ua: NEGATIVE
Nitrite: NEGATIVE
Protein, ur: NEGATIVE mg/dL
Specific Gravity, Urine: 1.025 (ref 1.005–1.030)
Urobilinogen, UA: 0.2 mg/dL (ref 0.0–1.0)
pH: 6 (ref 5.0–8.0)

## 2019-09-27 LAB — LIPASE, BLOOD: Lipase: 27 U/L (ref 11–51)

## 2019-09-27 MED ORDER — NAPROXEN 500 MG PO TABS: 500.0000 mg | ORAL_TABLET | Freq: Two times a day (BID) | ORAL | 0 refills | Status: DC

## 2019-09-27 NOTE — Discharge Instructions (Signed)
Your urine does demonstrate some microscopic blood, which can be present with presence of kidney stones, which can also cause symptoms similar to what you are experiencing.  Naproxen twice a day to see if this is helpful with pain, take with food and drink plenty of water.  I will call you if I am concerned about any of your lab results.  I would recommend establishing and following up with a primary care provider for continued evaluation as you may need imaging to further diagnosis your pain.  If any worsening of pain, fevers, nausea vomiting, blood in urine, unable to urinate, or otherwise worsening please go to the ER.

## 2019-09-27 NOTE — Assessment & Plan Note (Signed)
Suspect fatty liver, for repeat LFTs in 2 wks and abd u/s; pt declines other such as acute hepatitis profile or GI referral for now

## 2019-09-27 NOTE — Telephone Encounter (Signed)
Patient contacted and made aware of   blood results. Pt verbalized understanding and had all questions answered. Pt has follow up with PCP in two weeks.

## 2019-09-27 NOTE — ED Triage Notes (Signed)
Patient presents to Urgent Care with complaints of sharp left flank pain that radiates down to his left testicle since 2.5 months. Patient reports he had similar problem last year and his PCP checked his liver levels and gave him medication that he thinks was some kind of vitamin.

## 2019-09-27 NOTE — Patient Instructions (Signed)
OK to take the anti-imflammatory as prescribed  Please return in 2 weeks for repeat liver function testing (just go to the LAB only)  You will be contacted regarding the referral for: Liver ultrasound to check to see if might be fatty liver  Please continue all other medications as before, and refills have been done if requested.  Please have the pharmacy call with any other refills you may need.  Please continue your efforts at being more active, low cholesterol diet, and weight control.  Please keep your appointments with your specialists as you may have planned

## 2019-09-27 NOTE — Progress Notes (Signed)
Subjective:    Patient ID: Trevor Wallace., male    DOB: 07/24/1976, 43 y.o.   MRN: [id number]  HPI  Here to f/u recent UC visit earlier today with finding of mild elevated LFTs; Denies worsening reflux, abd pain, dysphagia, n/v, bowel change or blood.  Pt denies chest pain, increased sob or doe, wheezing, orthopnea, PND, increased LE swelling, palpitations, dizziness or syncope.   Pt denies polydipsia, polyuria.  Has gained significant wt over thepast 2 yrs in concert with slight elevation LFTs.  Pt denies fever, wt loss, night sweats, loss of appetite, or other constitutional symptoms  Pt denies new neurological symptoms such as new headache, or facial or extremity weakness or numbness Past Medical History:  Diagnosis Date  . Prediabetes    No past surgical history on file.  reports that he has quit smoking. His smoking use included cigarettes. He has never used smokeless tobacco. He reports that he does not drink alcohol or use drugs. family history includes Cancer in his father; Diabetes in an other family member; Healthy in his mother. No Known Allergies Current Outpatient Medications on File Prior to Visit  Medication Sig Dispense Refill  . naproxen (NAPROSYN) 500 MG tablet Take 1 tablet (500 mg total) by mouth 2 (two) times daily. 30 tablet 0  . permethrin (ELIMITE) 5 % cream Apply from neck down before bed then wash off in the morning. May repeat in one week. 60 g 1   No current facility-administered medications on file prior to visit.    Review of Systems  Constitutional: Negative for other unusual diaphoresis or sweats HENT: Negative for ear discharge or swelling Eyes: Negative for other worsening visual disturbances Respiratory: Negative for stridor or other swelling  Gastrointestinal: Negative for worsening distension or other blood Genitourinary: Negative for retention or other urinary change Musculoskeletal: Negative for other MSK pain or swelling Skin: Negative for  color change or other new lesions Neurological: Negative for worsening tremors and other numbness  Psychiatric/Behavioral: Negative for worsening agitation or other fatigue All otherwise neg per pt    Objective:   Physical Exam BP 116/78   Pulse 82   Temp 98.4 F (36.9 C) (Oral)   Ht 5\' 8"  (1.727 m)   Wt 229 lb (103.9 kg)   SpO2 97%   BMI 34.82 kg/m  VS noted,  Constitutional: Pt appears in NAD HENT: Head: NCAT.  Wallace Ear: External ear normal.  Left Ear: External ear normal.  Eyes: . Pupils are equal, round, and reactive to light. Conjunctivae and EOM are normal Nose: without d/c or deformity Neck: Neck supple. Gross normal ROM Cardiovascular: Normal rate and regular rhythm.   Pulmonary/Chest: Effort normal and breath sounds without rales or wheezing.  Abd:  Soft, NT, ND, + BS, no organomegaly Neurological: Pt is alert. At baseline orientation, motor grossly intact Skin: Skin is warm. No rashes, other new lesions, no LE edema Psychiatric: Pt behavior is normal without agitation  All otherwise neg per pt Lab Results  Component Value Date   WBC 7.5 09/27/2019   HGB 13.5 09/27/2019   HCT 44.3 09/27/2019   PLT 373 09/27/2019   GLUCOSE 95 09/27/2019   CHOL 208 (H) 02/15/2017   TRIG 142.0 02/15/2017   HDL 45.30 02/15/2017   LDLCALC 135 (H) 02/15/2017   ALT 64 (H) 09/27/2019   AST 42 (H) 09/27/2019   NA 141 09/27/2019   K 4.1 09/27/2019   CL 105 09/27/2019   CREATININE 0.81 09/27/2019  BUN 14 09/27/2019   CO2 27 09/27/2019   TSH 0.71 02/15/2017   PSA 0.16 02/15/2017   HGBA1C 6.4 02/15/2017          Assessment & Plan:

## 2019-09-27 NOTE — ED Provider Notes (Signed)
[facility]    CSN: [id number] Arrival date & time: 09/27/19  [id number]      History   Chief Complaint Chief Complaint  Patient presents with  . Flank Pain    HPI Trevor RightWilliam Belnap Jr. is a 43 y.o. male.   Trevor RightWilliam Sandoval Jr. presents with complaints of left flank and abdominal pain. This has been intermittent for years now, constant over the past 3-4 months, and worse over the past few weeks. Radiates to left leg and left testicle. No pain with urination, frequency of urination or penile discharge. No new swelling, redness or warm to scrotum or testicles. No known injury. States he saw his doctor two weeks ago for this same thing, and had his blood tested. States that his liver enzymes were elevated. He is not taking any medications for symptoms. Denies any use of alcohol, doesn't take tylenol. No nausea vomiting or diarrhea. Has been having normal bowel movements, states he has been taking a supplement of some time to keep his bowel movements regular. He goes to the gym regularly, states he even stopped for a full week without any improvement of symptoms. No known trigger of pain, it just comes and goes constantly. No fevers. He has had more frequent headache's. He is sexually active with his wife, denies concerns for std's. Per chart review has had abdominal ct's in the ER twice for very similar complaints, most recent 12/2017.  No other known medical history.     ROS per HPI, negative if not otherwise mentioned.      Past Medical History:  Diagnosis Date  . Prediabetes     Patient Active Problem List   Diagnosis Date Noted  . Preventative health care 02/15/2017  . Smoker 02/15/2017  . Dysuria 02/15/2017  . Prediabetes     History reviewed. No pertinent surgical history.     Home Medications    Prior to Admission medications   Medication Sig Start Date End Date Taking? Authorizing Provider  naproxen (NAPROSYN) 500 MG tablet Take 1 tablet (500 mg total) by  mouth 2 (two) times daily. 09/27/19   Georgetta HaberBurky, Ryaan Vanwagoner B, NP  permethrin (ELIMITE) 5 % cream Apply from neck down before bed then wash off in the morning. May repeat in one week. 01/31/19   Mardella LaymanHagler, Brian, MD    Family History Family History  Problem Relation Age of Onset  . Diabetes Other   . Healthy Mother   . Cancer Father     Social History Social History   Tobacco Use  . Smoking status: Former Smoker    Types: Cigarettes  . Smokeless tobacco: Never Used  Substance Use Topics  . Alcohol use: No    Alcohol/week: 0.0 standard drinks  . Drug use: No     Allergies   Patient has no known allergies.   Review of Systems Review of Systems   Physical Exam Triage Vital Signs ED Triage Vitals  Enc Vitals Group     BP 09/27/19 0855 134/77     Pulse Rate 09/27/19 0855 79     Resp 09/27/19 0855 17     Temp 09/27/19 0855 98.4 F (36.9 C)     Temp Source 09/27/19 0855 Oral     SpO2 09/27/19 0855 98 %     Weight --      Height --      Head Circumference --      Peak Flow --      Pain Score 09/27/19 0852 8  Pain Loc --      Pain Edu? --      Excl. in GC? --    No data found.  Updated Vital Signs BP 134/77 (BP Location: Right Arm)   Pulse 79   Temp 98.4 F (36.9 C) (Oral)   Resp 17   SpO2 98%    Physical Exam Exam conducted with a chaperone present.  Constitutional:      Appearance: He is well-developed.  Cardiovascular:     Rate and Rhythm: Normal rate.  Pulmonary:     Effort: Pulmonary effort is normal.  Abdominal:     Tenderness: There is abdominal tenderness. There is no right CVA tenderness, left CVA tenderness, guarding or rebound.    Genitourinary:    Penis: Normal.      Scrotum/Testes: Normal.        Right: Tenderness or swelling not present.        Left: Tenderness or swelling not present.     Comments: Mild tenderness to left inguinal region on palpation without obvious palpable hernia with exertion; soft  Skin:    General: Skin is warm  and dry.  Neurological:     Mental Status: He is alert and oriented to person, place, and time.      UC Treatments / Results  Labs (all labs ordered are listed, but only abnormal results are displayed) Labs Reviewed  POCT URINALYSIS DIP (DEVICE) - Abnormal; Notable for the following components:      Result Value   Hgb urine dipstick TRACE (*)    All other components within normal limits  URINE CULTURE  CBC WITH DIFFERENTIAL/PLATELET  COMPREHENSIVE METABOLIC PANEL  LIPASE, BLOOD    EKG   Radiology No results found.  Procedures Procedures (including critical care time)  Medications Ordered in UC Medications - No data to display  Initial Impression / Assessment and Plan / UC Course  I have reviewed the triage vital signs and the nursing notes.  Pertinent labs & imaging results that were available during my care of the patient were reviewed by me and considered in my medical decision making (see chart for details).     Non toxic, afebrile. Non specific abdominal exam. hgb to urine, question passage of kidney stone? Ct's in the past have been negative for stone. Culture of urine obtained. No obvious hernia presence although this is also considered. Muscular pain also considered as patient states treatment for the same in the past helped, which appears to have been nsaids. Cpc, cmp and lipase collected and pending. Encouraged establish and follow up with pcp for recheck as may need outpatient imaging if symptoms persist. Return precautions provided. Patient verbalized understanding and agreeable to plan.   Final Clinical Impressions(s) / UC Diagnoses   Final diagnoses:  Left flank pain  Abdominal pain, left lower quadrant     Discharge Instructions     Your urine does demonstrate some microscopic blood, which can be present with presence of kidney stones, which can also cause symptoms similar to what you are experiencing.  Naproxen twice a day to see if this is helpful  with pain, take with food and drink plenty of water.  I will call you if I am concerned about any of your lab results.  I would recommend establishing and following up with a primary care provider for continued evaluation as you may need imaging to further diagnosis your pain.  If any worsening of pain, fevers, nausea vomiting, blood in urine, unable to  urinate, or otherwise worsening please go to the ER.     ED Prescriptions    Medication Sig Dispense Auth. Provider   naproxen (NAPROSYN) 500 MG tablet Take 1 tablet (500 mg total) by mouth 2 (two) times daily. 30 tablet Zigmund Gottron, NP     PDMP not reviewed this encounter.   Zigmund Gottron, NP 09/27/19 [phone]

## 2019-09-27 NOTE — Assessment & Plan Note (Signed)
stable overall by history and exam, recent data reviewed with pt, and pt to continue medical treatment as before,  to f/u any worsening symptoms or concerns  

## 2019-09-28 LAB — URINE CULTURE: Culture: NO GROWTH

## 2019-09-30 ENCOUNTER — Other Ambulatory Visit: Payer: Self-pay | Admitting: Internal Medicine

## 2019-09-30 DIAGNOSIS — R7989 Other specified abnormal findings of blood chemistry: Secondary | ICD-10-CM

## 2019-10-14 ENCOUNTER — Telehealth: Payer: Self-pay | Admitting: Internal Medicine

## 2019-10-14 DIAGNOSIS — E559 Vitamin D deficiency, unspecified: Secondary | ICD-10-CM

## 2019-10-14 MED ORDER — VITAMIN D (ERGOCALCIFEROL) 1.25 MG (50000 UNIT) PO CAPS: 50000.0000 [IU] | ORAL_CAPSULE | ORAL | 0 refills | Status: DC

## 2019-10-14 NOTE — Telephone Encounter (Signed)
[place] for vit D with LFTs tomorrow as planned  Ok to let pt know, I accidentally sent a new rx for Vit D to his pharmacy, but he may not need this if the Vit D test is normal

## 2019-10-15 ENCOUNTER — Encounter: Payer: Self-pay | Admitting: Internal Medicine

## 2019-10-15 ENCOUNTER — Other Ambulatory Visit (INDEPENDENT_AMBULATORY_CARE_PROVIDER_SITE_OTHER): Payer: Self-pay

## 2019-10-15 ENCOUNTER — Other Ambulatory Visit: Payer: Self-pay | Admitting: Internal Medicine

## 2019-10-15 DIAGNOSIS — E559 Vitamin D deficiency, unspecified: Secondary | ICD-10-CM

## 2019-10-15 DIAGNOSIS — R7989 Other specified abnormal findings of blood chemistry: Secondary | ICD-10-CM

## 2019-10-15 LAB — HEPATIC FUNCTION PANEL
ALT: 46 U/L (ref 0–53)
AST: 34 U/L (ref 0–37)
Albumin: 4.1 g/dL (ref 3.5–5.2)
Alkaline Phosphatase: 92 U/L (ref 39–117)
Bilirubin, Direct: 0.1 mg/dL (ref 0.0–0.3)
Total Bilirubin: 0.4 mg/dL (ref 0.2–1.2)
Total Protein: 6.5 g/dL (ref 6.0–8.3)

## 2019-10-15 LAB — VITAMIN D 25 HYDROXY (VIT D DEFICIENCY, FRACTURES): VITD: 14.26 ng/mL — ABNORMAL LOW (ref 30.00–100.00)

## 2019-10-15 MED ORDER — VITAMIN D (ERGOCALCIFEROL) 1.25 MG (50000 UNIT) PO CAPS: 50000.0000 [IU] | ORAL_CAPSULE | ORAL | 0 refills | Status: DC

## 2019-10-16 ENCOUNTER — Ambulatory Visit
Admission: RE | Admit: 2019-10-16 | Discharge: 2019-10-16 | Disposition: A | Discharge status: Home / Self Care | Payer: Self-pay | Source: Ambulatory Visit | Attending: Internal Medicine | Admitting: Internal Medicine

## 2019-10-16 DIAGNOSIS — R7989 Other specified abnormal findings of blood chemistry: Secondary | ICD-10-CM

## 2019-10-17 ENCOUNTER — Encounter: Payer: Self-pay | Admitting: Internal Medicine

## 2020-01-24 ENCOUNTER — Ambulatory Visit (HOSPITAL_COMMUNITY)
Admission: EM | Admit: 2020-01-24 | Discharge: 2020-01-24 | Disposition: A | Discharge status: Home / Self Care | Payer: Self-pay | Attending: Family Medicine | Admitting: Family Medicine

## 2020-01-24 ENCOUNTER — Encounter (HOSPITAL_COMMUNITY): Payer: Self-pay | Admitting: Emergency Medicine

## 2020-01-24 ENCOUNTER — Other Ambulatory Visit: Payer: Self-pay

## 2020-01-24 DIAGNOSIS — R339 Retention of urine, unspecified: Secondary | ICD-10-CM | POA: Insufficient documentation

## 2020-01-24 DIAGNOSIS — R109 Unspecified abdominal pain: Secondary | ICD-10-CM | POA: Insufficient documentation

## 2020-01-24 DIAGNOSIS — N3000 Acute cystitis without hematuria: Secondary | ICD-10-CM | POA: Insufficient documentation

## 2020-01-24 DIAGNOSIS — Z711 Person with feared health complaint in whom no diagnosis is made: Secondary | ICD-10-CM | POA: Insufficient documentation

## 2020-01-24 LAB — POCT URINALYSIS DIP (DEVICE)
Bilirubin Urine: NEGATIVE
Glucose, UA: NEGATIVE mg/dL
Hgb urine dipstick: NEGATIVE
Ketones, ur: NEGATIVE mg/dL
Nitrite: NEGATIVE
Protein, ur: NEGATIVE mg/dL
Specific Gravity, Urine: >=1.03 (ref 1.005–1.030)
Urobilinogen, UA: 0.2 mg/dL (ref 0.0–1.0)
pH: 6.5 (ref 5.0–8.0)

## 2020-01-24 MED ORDER — CEFTRIAXONE SODIUM 500 MG IJ SOLR
INTRAMUSCULAR | Status: AC
  Filled 2020-01-24: qty 500

## 2020-01-24 MED ORDER — CEFTRIAXONE SODIUM 500 MG IJ SOLR
500.0000 mg | Freq: Once | INTRAMUSCULAR | Status: AC
  Administered 2020-01-24: 500 mg via INTRAMUSCULAR

## 2020-01-24 MED ORDER — TAMSULOSIN HCL 0.4 MG PO CAPS: 0.4000 mg | ORAL_CAPSULE | Freq: Every day | ORAL | 0 refills | Status: DC

## 2020-01-24 MED ORDER — SULFAMETHOXAZOLE-TRIMETHOPRIM 800-160 MG PO TABS: 1.0000 | ORAL_TABLET | Freq: Two times a day (BID) | ORAL | 0 refills | Status: DC

## 2020-01-24 NOTE — Discharge Instructions (Addendum)
Start Flomax once daily for management of current urine retention. I am treating you for UTI based on midnight Alysis today.  You received 500 mg of Rocephin here in clinic for initiation of antibiotic treatment.  You will also start Bactrim 1 tablet twice daily for 10 days. Urine culture is pending STD testing is pending.  We will contact you if you require any additional treatment.

## 2020-01-24 NOTE — ED Triage Notes (Signed)
Pt states generalized abdominal pain and difficulty urinating for the last few days. States it burns when he pees.

## 2020-01-24 NOTE — ED Provider Notes (Signed)
[facility]    CSN: [id number] Arrival date & time: 01/24/20  1544      History   Chief Complaint Chief Complaint  Patient presents with  . Abdominal Pain    HPI Merdith Wallace. is a 44 y.o. male.   Pertinent medical history includes persistently elevated LFTs and prediabetes.  HPI  Patient presents today for evaluation of generalized lower abdominal discomfort and urinary retention ongoing for the last few days.  He endorses dysuria with urination.  Patient has been seen multiple times previously over the course the last 2 years with similar complaint. He has had a normal CT of the abdomen along with CT renal study and an abdominal ultrasound which was nonyielding of any specific cause of patient's symptoms.  He is typically treated with naproxen for abdominal pain chest relieves symptoms in the past.  He was also previously treated for presumptive UTI about 2 years ago with doxycycline and to his knowledge this resolved his symptoms at that time.  Today he reports at least 3 to 4 days of producing urine at most 2-3 times per day.  Reports a large amount when he goes however he has to work-up an effort to be able to produce urine.  He denies any hematuria, nausea, vomiting, fever or chills.  He is uncertain as to whether he has been exposed to any STDs and would consent to STD testing today.  He has not taken anything for his symptoms.  He denies visible hematuria.  He is a former cigarette smoker.  He denies any known history of enlarged prostate.  He is followed by primary care , Dr. Oliver Barre, and has never been evaluated by urologist pertaining to his 2-year history of urinary symptoms. Past Medical History:  Diagnosis Date  . Prediabetes     Patient Active Problem List   Diagnosis Date Noted  . Elevated LFTs 09/27/2019  . Preventative health care 02/15/2017  . Former smoker 02/15/2017  . Dysuria 02/15/2017  . Prediabetes     History reviewed. No pertinent  surgical history.     Home Medications    Prior to Admission medications   Medication Sig Start Date End Date Taking? Authorizing Provider  naproxen (NAPROSYN) 500 MG tablet Take 1 tablet (500 mg total) by mouth 2 (two) times daily. Patient not taking: Reported on 01/24/2020 09/27/19   Georgetta Haber, NP  permethrin (ELIMITE) 5 % cream Apply from neck down before bed then wash off in the morning. May repeat in one week. Patient not taking: Reported on 01/24/2020 01/31/19   Mardella Layman, MD  Vitamin D, Ergocalciferol, (DRISDOL) 1.25 MG (50000 UT) CAPS capsule Take 1 capsule (50,000 Units total) by mouth every 7 (seven) days. Patient not taking: Reported on 01/24/2020 10/15/19   Corwin Levins, MD    Family History Family History  Problem Relation Age of Onset  . Diabetes Other   . Healthy Mother   . Cancer Father     Social History Social History   Tobacco Use  . Smoking status: Former Smoker    Types: Cigarettes  . Smokeless tobacco: Never Used  Substance Use Topics  . Alcohol use: No    Alcohol/week: 0.0 standard drinks  . Drug use: No     Allergies   Patient has no known allergies.   Review of Systems Review of Systems Pertinent negatives listed in HPI Physical Exam Triage Vital Signs ED Triage Vitals  Enc Vitals Group  BP 01/24/20 1608 130/76     Pulse Rate 01/24/20 1608 86     Resp 01/24/20 1608 18     Temp 01/24/20 1608 98.7 F (37.1 C)     Temp src --      SpO2 01/24/20 1608 97 %     Weight --      Height --      Head Circumference --      Peak Flow --      Pain Score 01/24/20 1610 8     Pain Loc --      Pain Edu? --      Excl. in GC? --    No data found.  Updated Vital Signs BP 130/76   Pulse 86   Temp 98.7 F (37.1 C)   Resp 18   SpO2 97%   Visual Acuity Right Eye Distance:   Left Eye Distance:   Bilateral Distance:    Right Eye Near:   Left Eye Near:    Bilateral Near:     Physical Exam Abdominal:     General: Bowel  sounds are normal. There is distension.     Palpations: Abdomen is soft.     Tenderness: There is abdominal tenderness in the right lower quadrant and left lower quadrant. There is right CVA tenderness. There is no left CVA tenderness or rebound.     Hernia: No hernia is present.  Skin:    General: Skin is warm.  Neurological:     General: No focal deficit present.     Mental Status: He is alert.  Psychiatric:        Mood and Affect: Mood normal.        Behavior: Behavior normal.      UC Treatments / Results  Labs (all labs ordered are listed, but only abnormal results are displayed) Labs Reviewed  POCT URINALYSIS DIP (DEVICE) - Abnormal; Notable for the following components:      Result Value   Leukocytes,Ua LARGE (*)    All other components within normal limits  URINE CULTURE  CYTOLOGY, (ORAL, ANAL, URETHRAL) ANCILLARY ONLY    EKG   Radiology No results found.  Procedures Procedures (including critical care time)  Medications Ordered in UC Medications - No data to display  Initial Impression / Assessment and Plan / UC Course  I have reviewed the triage vital signs and the nursing notes.  Pertinent labs & imaging results that were available during my care of the patient were reviewed by me and considered in my medical decision making (see chart for details).    1. Right flank pain, suspect UTI secondary to urinary retention. -Tx UTI, Bactrim BID x 10 days -Encouraged follow-up with urology to evaluate cause of urine retention  2.Urine retention -Trial Flomax .4 mg once daily  -Follow-up with urology  3.Concern about STD in male without diagnosis -Urethral cytology pending  4. Acute cystitis without hematuria -Rocephin 500 mg IM administered in here in clinic  -Start Bactrim -F/u with urology need further work-up regarding cause of hematuria and urine retention.  Red flags discussed. Strict follow-up precautions at the ER discussed.    Final Clinical  Impressions(s) / UC Diagnoses   Final diagnoses:  Right flank pain  Urine retention  Concern about STD in male without diagnosis  Acute cystitis without hematuria     Discharge Instructions     Start Flomax once daily for management of current urine retention. I am treating you for UTI based  on midnight Alysis today.  You received 500 mg of Rocephin here in clinic for initiation of antibiotic treatment.  You will also start Bactrim 1 tablet twice daily for 10 days. Urine culture is pending STD testing is pending.  We will contact you if you require any additional treatment.    ED Prescriptions    None     PDMP not reviewed this encounter.   Scot Jun, FNP 01/25/20 1149

## 2020-01-25 LAB — URINE CULTURE: Culture: NO GROWTH

## 2020-01-28 LAB — CYTOLOGY, (ORAL, ANAL, URETHRAL) ANCILLARY ONLY
Chlamydia: NEGATIVE
Neisseria Gonorrhea: NEGATIVE
Trichomonas: NEGATIVE

## 2020-02-24 ENCOUNTER — Other Ambulatory Visit: Payer: Self-pay

## 2020-02-24 ENCOUNTER — Ambulatory Visit (HOSPITAL_COMMUNITY)
Admission: EM | Admit: 2020-02-24 | Discharge: 2020-02-24 | Disposition: A | Discharge status: Home / Self Care | Payer: Self-pay | Attending: Family Medicine | Admitting: Family Medicine

## 2020-02-24 DIAGNOSIS — R5383 Other fatigue: Secondary | ICD-10-CM

## 2020-02-24 DIAGNOSIS — R6883 Chills (without fever): Secondary | ICD-10-CM

## 2020-02-24 DIAGNOSIS — Z20822 Contact with and (suspected) exposure to covid-19: Secondary | ICD-10-CM | POA: Insufficient documentation

## 2020-02-24 DIAGNOSIS — Z87891 Personal history of nicotine dependence: Secondary | ICD-10-CM | POA: Insufficient documentation

## 2020-02-24 DIAGNOSIS — R300 Dysuria: Secondary | ICD-10-CM

## 2020-02-24 DIAGNOSIS — R000 Tachycardia, unspecified: Secondary | ICD-10-CM

## 2020-02-24 DIAGNOSIS — R81 Glycosuria: Secondary | ICD-10-CM

## 2020-02-24 DIAGNOSIS — R519 Headache, unspecified: Secondary | ICD-10-CM | POA: Insufficient documentation

## 2020-02-24 LAB — POCT URINALYSIS DIP (DEVICE)
Bilirubin Urine: NEGATIVE
Glucose, UA: 250 mg/dL — AB
Hgb urine dipstick: NEGATIVE
Leukocytes,Ua: NEGATIVE
Nitrite: NEGATIVE
Protein, ur: NEGATIVE mg/dL
Specific Gravity, Urine: >=1.03 (ref 1.005–1.030)
Urobilinogen, UA: 0.2 mg/dL (ref 0.0–1.0)
pH: 5.5 (ref 5.0–8.0)

## 2020-02-24 NOTE — Discharge Instructions (Addendum)
You may have a urinary tract infection. We are going to culture your urine and will call you as soon as we have the results.   Drink plenty of water, 8-10 glasses per day.   You may take AZO over the counter for painful urination.  Follow up with your primary care provider as needed.   Go to the Emergency Department if you experience severe pain, shortness of breath, high fever, or other concerns.   You also have sugar in your urine, which is found in diabetic patients.  You need to follow-up with your primary care physician for this.  Your COVID test is pending.  You should self quarantine until the test result is back.    Take Tylenol as needed for fever or discomfort.  Rest and keep yourself hydrated.    Go to the emergency department if you develop shortness of breath, severe diarrhea, high fever not relieved by Tylenol or ibuprofen, or other concerning symptoms.

## 2020-02-24 NOTE — ED Provider Notes (Signed)
[facility]    CSN: [id number] Arrival date & time: 02/24/20  1423      History   Chief Complaint Chief Complaint  Patient presents with  . Headache  . Chills  . Generalized Body Aches    HPI Trevor Wallace. is a 44 y.o. male.   Patient reports that he has been experiencing dysuria, chills, headache, lower abdominal pain, body aches for the last 4 days.  Has made no attempt to treat this at home.  Patient states that he was here in this office on about 3 weeks ago, and was treated for UTI.  Reports he did not take all of his medication, that he lost the bottle halfway through his regimen.  Patient reports that he has been more thirsty than normal, and that he has been having to void more often than he normally does.  Requesting another UA today.  Patient states that he does have a PCP but that he has not seen him in years.  Patient denies nausea, vomiting, diarrhea, fever, rash, other symptoms.  ROS per HPI  The history is provided by the patient.  Headache   Past Medical History:  Diagnosis Date  . Prediabetes     Patient Active Problem List   Diagnosis Date Noted  . Elevated LFTs 09/27/2019  . Preventative health care 02/15/2017  . Former smoker 02/15/2017  . Dysuria 02/15/2017  . Prediabetes     No past surgical history on file.     Home Medications    Prior to Admission medications   Medication Sig Start Date End Date Taking? Authorizing Provider  permethrin (ELIMITE) 5 % cream Apply from neck down before bed then wash off in the morning. May repeat in one week. Patient not taking: Reported on 01/24/2020 01/31/19   Mardella Layman, MD  sulfamethoxazole-trimethoprim (BACTRIM DS) 800-160 MG tablet Take 1 tablet by mouth 2 (two) times daily. 01/24/20   Bing Neighbors, FNP  tamsulosin (FLOMAX) 0.4 MG CAPS capsule Take 1 capsule (0.4 mg total) by mouth daily. 01/24/20   Bing Neighbors, FNP  Vitamin D, Ergocalciferol, (DRISDOL) 1.25 MG (50000  UT) CAPS capsule Take 1 capsule (50,000 Units total) by mouth every 7 (seven) days. Patient not taking: Reported on 01/24/2020 10/15/19   Corwin Levins, MD    Family History Family History  Problem Relation Age of Onset  . Diabetes Other   . Healthy Mother   . Cancer Father     Social History Social History   Tobacco Use  . Smoking status: Former Smoker    Types: Cigarettes  . Smokeless tobacco: Never Used  Substance Use Topics  . Alcohol use: No    Alcohol/week: 0.0 standard drinks  . Drug use: No     Allergies   Patient has no known allergies.   Review of Systems Review of Systems  Neurological: Positive for headaches.     Physical Exam Triage Vital Signs ED Triage Vitals  Enc Vitals Group     BP 02/24/20 1527 130/80     Pulse Rate 02/24/20 1527 (!) 101     Resp 02/24/20 1527 18     Temp 02/24/20 1527 98.3 F (36.8 C)     Temp Source 02/24/20 1527 Oral     SpO2 02/24/20 1527 98 %     Weight --      Height --      Head Circumference --      Peak Flow --  Pain Score 02/24/20 1525 10     Pain Loc --      Pain Edu? --      Excl. in [place]? --    No data found.  Updated Vital Signs BP 130/80 (BP Location: Left Arm)   Pulse (!) 101   Temp 98.3 F (36.8 C) (Oral)   Resp 18   SpO2 98%   Visual Acuity Right Eye Distance:   Left Eye Distance:   Bilateral Distance:    Right Eye Near:   Left Eye Near:    Bilateral Near:     Physical Exam Vitals and nursing note reviewed.  Constitutional:      General: He is not in acute distress.    Appearance: Normal appearance. He is well-developed and normal weight. He is ill-appearing.  HENT:     Head: Normocephalic and atraumatic.     Nose: Nose normal.     Mouth/Throat:     Mouth: Mucous membranes are dry.  Eyes:     Extraocular Movements: Extraocular movements intact.     Conjunctiva/sclera: Conjunctivae normal.     Pupils: Pupils are equal, round, and reactive to light.  Cardiovascular:     Rate  and Rhythm: Regular rhythm. Tachycardia present.     Heart sounds: Normal heart sounds. No murmur.  Pulmonary:     Effort: Pulmonary effort is normal. No respiratory distress.     Breath sounds: Normal breath sounds. No stridor. No wheezing, rhonchi or rales.  Chest:     Chest wall: No tenderness.  Abdominal:     General: Bowel sounds are normal. There is no distension.     Palpations: Abdomen is soft. There is no mass.     Tenderness: There is abdominal tenderness in the suprapubic area. There is right CVA tenderness and left CVA tenderness. There is no guarding or rebound.     Hernia: No hernia is present.  Musculoskeletal:     Cervical back: Normal range of motion and neck supple.  Lymphadenopathy:     Cervical: No cervical adenopathy.  Skin:    General: Skin is warm and dry.     Capillary Refill: Capillary refill takes less than 2 seconds.  Neurological:     General: No focal deficit present.     Mental Status: He is alert and oriented to person, place, and time.  Psychiatric:        Mood and Affect: Mood normal.        Behavior: Behavior normal.      UC Treatments / Results  Labs (all labs ordered are listed, but only abnormal results are displayed) Labs Reviewed  POCT URINALYSIS DIP (DEVICE) - Abnormal; Notable for the following components:      Result Value   Glucose, UA 250 (*)    Ketones, ur TRACE (*)    All other components within normal limits  SARS CORONAVIRUS 2 (TAT 6-24 HRS)  URINE CULTURE    EKG   Radiology No results found.  Procedures Procedures (including critical care time)  Medications Ordered in UC Medications - No data to display  Initial Impression / Assessment and Plan / UC Course  I have reviewed the triage vital signs and the nursing notes.  Pertinent labs & imaging results that were available during my care of the patient were reviewed by me and considered in my medical decision making (see chart for details).     Dysuria: UA in  office negative for infection today, UA does show  250-glucose in the urine.  Patient has history of being prediabetic, no medications for this.  Patient tachycardic on exam today.  Patient instructed that he has got to follow-up with his primary care provider to have further work-up for diabetes.  Discussed in depth the seriousness of having high blood sugar consistently.  Covid swab also obtained in office today.  Patient instructed to quarantine until results are negative.  If results are negative, patient may return to his usual schedule with no fever for 24 hours without antipyretic medication.  If results are positive, patient is to quarantine 10 days from today.  Patient instructed follow-up with the ER for trouble swallowing, trouble breathing, other concerning symptoms.  Patient verbalizes understanding and agrees to treatment plan. Final Clinical Impressions(s) / UC Diagnoses   Final diagnoses:  Chills  Tachycardia  Fatigue, unspecified type  Dysuria  Glycosuria     Discharge Instructions     You may have a urinary tract infection. We are going to culture your urine and will call you as soon as we have the results.   Drink plenty of water, 8-10 glasses per day.   You may take AZO over the counter for painful urination.  Follow up with your primary care provider as needed.   Go to the Emergency Department if you experience severe pain, shortness of breath, high fever, or other concerns.   You also have sugar in your urine, which is found in diabetic patients.  You need to follow-up with your primary care physician for this.  Your COVID test is pending.  You should self quarantine until the test result is back.    Take Tylenol as needed for fever or discomfort.  Rest and keep yourself hydrated.    Go to the emergency department if you develop shortness of breath, severe diarrhea, high fever not relieved by Tylenol or ibuprofen, or other concerning symptoms.       ED  Prescriptions    None     PDMP not reviewed this encounter.   Moshe Cipro, NP 02/24/20 1808

## 2020-02-24 NOTE — ED Triage Notes (Signed)
Pt reports chills, headache, lower abdominal pain and body aches x 4 days.

## 2020-02-25 ENCOUNTER — Ambulatory Visit (INDEPENDENT_AMBULATORY_CARE_PROVIDER_SITE_OTHER): Payer: Self-pay | Admitting: Family

## 2020-02-25 ENCOUNTER — Encounter: Payer: Self-pay | Admitting: Family

## 2020-02-25 VITALS — BP 120/82 | HR 75 | Temp 98.4°F | Ht 68.0 in | Wt 222.6 lb

## 2020-02-25 DIAGNOSIS — R81 Glycosuria: Secondary | ICD-10-CM

## 2020-02-25 LAB — COMPREHENSIVE METABOLIC PANEL
ALT: 59 U/L — ABNORMAL HIGH (ref 0–53)
AST: 34 U/L (ref 0–37)
Albumin: 4.4 g/dL (ref 3.5–5.2)
Alkaline Phosphatase: 78 U/L (ref 39–117)
BUN: 13 mg/dL (ref 6–23)
CO2: 31 mEq/L (ref 19–32)
Calcium: 9.3 mg/dL (ref 8.4–10.5)
Chloride: 102 mEq/L (ref 96–112)
Creatinine, Ser: 0.91 mg/dL (ref 0.40–1.50)
GFR: 109.31 mL/min (ref >60.00)
Glucose, Bld: 123 mg/dL — ABNORMAL HIGH (ref 70–99)
Potassium: 3.7 mEq/L (ref 3.5–5.1)
Sodium: 137 mEq/L (ref 135–145)
Total Bilirubin: 0.4 mg/dL (ref 0.2–1.2)
Total Protein: 7.2 g/dL (ref 6.0–8.3)

## 2020-02-25 LAB — CBC WITH DIFFERENTIAL/PLATELET
Basophils Absolute: 0 10*3/uL (ref 0.0–0.1)
Basophils Relative: 0.3 % (ref 0.0–3.0)
Eosinophils Absolute: 0 10*3/uL (ref 0.0–0.7)
Eosinophils Relative: 0.4 % (ref 0.0–5.0)
HCT: 43.3 % (ref 39.0–52.0)
Hemoglobin: 13.5 g/dL (ref 13.0–17.0)
Lymphocytes Relative: 26.8 % (ref 12.0–46.0)
Lymphs Abs: 2.6 10*3/uL (ref 0.7–4.0)
MCHC: 31.2 g/dL (ref 30.0–36.0)
MCV: 71.1 fl — ABNORMAL LOW (ref 78.0–100.0)
Monocytes Absolute: 0.8 10*3/uL (ref 0.1–1.0)
Monocytes Relative: 8.2 % (ref 3.0–12.0)
Neutro Abs: 6.2 10*3/uL (ref 1.4–7.7)
Neutrophils Relative %: 64.3 % (ref 43.0–77.0)
Platelets: 340 10*3/uL (ref 150.0–400.0)
RBC: 6.08 Mil/uL — ABNORMAL HIGH (ref 4.22–5.81)
RDW: 16.7 % — ABNORMAL HIGH (ref 11.5–15.5)
WBC: 9.6 10*3/uL (ref 4.0–10.5)

## 2020-02-25 LAB — URINE CULTURE: Culture: NO GROWTH

## 2020-02-25 LAB — HEMOGLOBIN A1C: Hgb A1c MFr Bld: 6.9 % — ABNORMAL HIGH (ref 4.6–6.5)

## 2020-02-25 LAB — SARS CORONAVIRUS 2 (TAT 6-24 HRS): SARS Coronavirus 2: NEGATIVE

## 2020-02-25 NOTE — Progress Notes (Signed)
  Trevor Balan. is a 44 y.o. male with the following history as recorded in EpicCare:  Patient Active Problem List   Diagnosis Date Noted  . Elevated LFTs 09/27/2019  . Preventative health care 02/15/2017  . Former smoker 02/15/2017  . Dysuria 02/15/2017  . Prediabetes     No current outpatient medications on file.   No current facility-administered medications for this visit.    Allergies: Patient has no known allergies.  Past Medical History:  Diagnosis Date  . Prediabetes     History reviewed. No pertinent surgical history.  Family History  Problem Relation Age of Onset  . Diabetes Other   . Healthy Mother   . Cancer Father     Social History   Tobacco Use  . Smoking status: Former Smoker    Types: Cigarettes  . Smokeless tobacco: Never Used  Substance Use Topics  . Alcohol use: No    Alcohol/week: 0.0 standard drinks    Subjective:  Patient was seen at [facility] yesterday with concerns for possible UTI; had been seen in March with similar symptoms- did not finish treatment in March; symptoms re-flared in the past 2 days and was experiencing burning/ frequency; Was told that there was a large amount of glucose in his urine and was most likely diabetic; known history of pre-diabetes- last Hgba1c was done here in 2018; 6.4 at that time- never took any medicine; Has been having increased thirst and urination; does feel that his vision has not been as good recently;  Did quit smoking last month;   Objective:  Vitals:   02/25/20 1305  BP: 120/82  Pulse: 75  Temp: 98.4 F (36.9 C)  TempSrc: Oral  SpO2: 97%  Weight: 222 lb 9.6 oz (101 kg)  Height: '5\' 8"'$  (1.727 m)    General: Well developed, well nourished, in no acute distress  Skin : Warm and dry.  Head: Normocephalic and atraumatic  Lungs: Respirations unlabored; clear to auscultation bilaterally without wheeze, rales, rhonchi  CVS exam: normal rate and regular rhythm.  Neurologic: Alert and oriented; speech  intact; face symmetrical; moves all extremities well; CNII-XII intact without focal deficit   Assessment:  1. Glycosuria     Plan:  Update labs today; will determine treatment plan based on lab results; will need to see his PCP in 2 week for follow-up.  This visit occurred during the SARS-CoV-2 public health emergency.  Safety protocols were in place, including screening questions prior to the visit, additional usage of staff PPE, and extensive cleaning of exam room while observing appropriate contact time as indicated for disinfecting solutions.     Return in about 2 weeks (around 03/10/2020) for with Dr. Jenny Reichmann.  Orders Placed This Encounter  Procedures  . CBC with Differential/Platelet  . Comp Met (CMET)  . HgB A1c    Requested Prescriptions    No prescriptions requested or ordered in this encounter

## 2020-02-26 ENCOUNTER — Other Ambulatory Visit: Payer: Self-pay | Admitting: Family

## 2020-02-26 MED ORDER — METFORMIN HCL 500 MG PO TABS: 500.0000 mg | ORAL_TABLET | Freq: Every day | ORAL | 0 refills | Status: DC

## 2020-03-05 ENCOUNTER — Telehealth: Payer: Self-pay

## 2020-03-05 NOTE — Telephone Encounter (Signed)
Ok to continue the medication since these symptoms are not at all typically seen in other patients; consider ov if symptoms persist to consider other cause

## 2020-03-05 NOTE — Telephone Encounter (Signed)
Informed Dr. Jonny Ruiz of pt concerns.

## 2020-03-05 NOTE — Telephone Encounter (Signed)
New message   The patient calling at work   Pt c/o medication issue:  1. Name of Medication: metFORMIN (GLUCOPHAGE) 500 MG tablet  2. How are you currently taking this medication (dosage and times per day)? Once a day   3. Are you having a reaction (difficulty breathing--STAT)? No   4. What is your medication issue?  hand tinkling & feet swelling

## 2020-03-06 NOTE — Telephone Encounter (Signed)
Spoke with pt informed him of Dr. Raphael Gibney advice. Pt has an upcoming appntmnt on 03/10/2020 @ 10am.

## 2020-03-10 ENCOUNTER — Ambulatory Visit: Payer: Self-pay | Admitting: Internal Medicine

## 2020-03-17 ENCOUNTER — Ambulatory Visit: Payer: Self-pay | Admitting: Internal Medicine

## 2020-03-17 DIAGNOSIS — Z0289 Encounter for other administrative examinations: Secondary | ICD-10-CM

## 2020-04-10 ENCOUNTER — Other Ambulatory Visit: Payer: Self-pay

## 2020-04-10 ENCOUNTER — Ambulatory Visit: Payer: Self-pay | Admitting: Internal Medicine

## 2020-04-10 ENCOUNTER — Encounter: Payer: Self-pay | Admitting: Internal Medicine

## 2020-04-10 VITALS — BP 110/80 | HR 78 | Temp 98.1°F | Ht 68.0 in | Wt 212.0 lb

## 2020-04-10 DIAGNOSIS — E119 Type 2 diabetes mellitus without complications: Secondary | ICD-10-CM

## 2020-04-10 DIAGNOSIS — Z87891 Personal history of nicotine dependence: Secondary | ICD-10-CM

## 2020-04-10 DIAGNOSIS — R350 Frequency of micturition: Secondary | ICD-10-CM

## 2020-04-10 MED ORDER — DOXYCYCLINE HYCLATE 100 MG PO TABS: 100.0000 mg | ORAL_TABLET | Freq: Two times a day (BID) | ORAL | 0 refills | Status: DC

## 2020-04-10 NOTE — Progress Notes (Signed)
Subjective:    Patient ID: Trevor Wallace., male    DOB: 1976-10-19, 44 y.o.   MRN: [id number]  HPI  Here to f/u as self pay pt whose care has been some limited by funds and compliance with recommendations; has had ongoing GU symptoms for about 2 yrs without urology eval due to pt unable to afford; most recently was seen mar 2021 with ? UTI tx with septra and flomax (of which he only took 1 or 2 pills each, then got spooked by wariness about side effects) but fortunately culture negative;  Seen again in ED april 12 where culture was negative, STD Negative and referred urology but did not follow up.  Then seen April 13 by NP here with A1c found 6.9 and started on metformin 500 qd only, but even with low dose has had loose stools and bowel frequency with each dosing so has not taken every day and in fact has several pills left from the original 1 mo rx.  Just cannot take and work at same time. Also of note pt did have feb 2019 - CT abd stone protocol neg for acute, does not want repeat now due to cost.  Symptoms most recently have been ongoing urinary frequency, urgency and itching tingling near the glans penis, occasional feeling warm and chills but not sure about fever, also feeling unusual fatigue and occasional dizziness. Plans to have insurance restarted in a few wks.  DID quit smoking x 1 mo, hoping to not go back to it.   Past Medical History:  Diagnosis Date  . Prediabetes    History reviewed. No pertinent surgical history.  reports that he has quit smoking. His smoking use included cigarettes. He has never used smokeless tobacco. He reports that he does not drink alcohol or use drugs. family history includes Cancer in his father; Diabetes in an other family member; Healthy in his mother. Allergies  Allergen Reactions  . Metformin And Related Other (See Comments)    Loose stools and bowel frequency   Current Outpatient Medications on File Prior to Visit  Medication Sig Dispense  Refill  . metFORMIN (GLUCOPHAGE) 500 MG tablet Take 1 tablet (500 mg total) by mouth daily. 30 tablet 0   No current facility-administered medications on file prior to visit.   Review of Systems  All otherwise neg per pt     Objective:   Physical Exam BP 110/80 (BP Location: Left Arm, Patient Position: Sitting, Cuff Size: Large)   Pulse 78   Temp 98.1 F (36.7 C) (Oral)   Ht 5\' 8"  (1.727 m)   Wt 212 lb (96.2 kg)   SpO2 96%   BMI 32.23 kg/m  VS noted, non toxic Constitutional: Pt appears in NAD HENT: Head: NCAT.  Right Ear: External ear normal.  Left Ear: External ear normal.  Eyes: . Pupils are equal, round, and reactive to light. Conjunctivae and EOM are normal Nose: without d/c or deformity Neck: Neck supple. Gross normal ROM Cardiovascular: Normal rate and regular rhythm.   Pulmonary/Chest: Effort normal and breath sounds without rales or wheezing.  Abd:  Soft, NT, ND, + BS, no organomegaly, DRE deferred per pt Neurological: Pt is alert. At baseline orientation, motor grossly intact Skin: Skin is warm. No rashes, other new lesions, no LE edema Psychiatric: Pt behavior is normal without agitation  All otherwise neg per pt  Lab Results  Component Value Date   WBC 9.6 02/25/2020   HGB 13.5 02/25/2020   HCT  43.3 02/25/2020   PLT 340.0 02/25/2020   GLUCOSE 123 (H) 02/25/2020   CHOL 208 (H) 02/15/2017   TRIG 142.0 02/15/2017   HDL 45.30 02/15/2017   LDLCALC 135 (H) 02/15/2017   ALT 59 (H) 02/25/2020   AST 34 02/25/2020   NA 137 02/25/2020   K 3.7 02/25/2020   CL 102 02/25/2020   CREATININE 0.91 02/25/2020   BUN 13 02/25/2020   CO2 31 02/25/2020   TSH 0.71 02/15/2017   PSA 0.16 02/15/2017   HGBA1C 6.9 (H) 02/25/2020          Assessment & Plan:

## 2020-04-10 NOTE — Patient Instructions (Addendum)
Please take all new medication as prescribed - the antibiotic  If not improved in 3 wks, please call for urology referral  Please continue all other medications as before, except ok to stop the metformin as you have  You will be contacted regarding the referral for: Diabetes education (diet)  Please have the pharmacy call with any other refills you may need.  Please continue your efforts at being more active, low cholesterol diabetic diet, and weight control.  Please make an Appointment to return in 3 months, or sooner if needed

## 2020-04-11 ENCOUNTER — Encounter: Payer: Self-pay | Admitting: Internal Medicine

## 2020-04-11 NOTE — Assessment & Plan Note (Signed)
New onset, very mild, ok to hold metformin due to reproducible gI upset even to low dose, refer dm education to which he is looking forward to, old further OHA for now, f/u at 3 mo with plan to recheck a1c and full lab eval

## 2020-04-11 NOTE — Assessment & Plan Note (Signed)
Encourage to continue to abstain permanently

## 2020-04-11 NOTE — Assessment & Plan Note (Signed)
Culture neg x 2 in past 2 months, suspect has underlying prostatitis given symptoms, ok for doxy course x 3 wks, and urology if not improved as cannot r/o other such as stricture

## 2020-06-23 ENCOUNTER — Ambulatory Visit: Payer: Self-pay | Admitting: Skilled Nursing Facility1

## 2020-07-14 ENCOUNTER — Encounter: Payer: Self-pay | Admitting: Internal Medicine

## 2020-07-14 ENCOUNTER — Ambulatory Visit (INDEPENDENT_AMBULATORY_CARE_PROVIDER_SITE_OTHER): Payer: Self-pay | Admitting: Internal Medicine

## 2020-07-14 ENCOUNTER — Other Ambulatory Visit: Payer: Self-pay

## 2020-07-14 DIAGNOSIS — E119 Type 2 diabetes mellitus without complications: Secondary | ICD-10-CM

## 2020-07-14 NOTE — Progress Notes (Signed)
   Subjective:    Patient ID: Trevor Right., male    DOB: 07/16/1976, 44 y.o.   MRN: [id number]  HPI  Here to f/u; overall doing ok,  Pt denies chest pain, increasing sob or doe, wheezing, orthopnea, PND, increased LE swelling, palpitations, dizziness or syncope.  Pt denies new neurological symptoms such as new headache, or facial or extremity weakness or numbness.  Pt denies polydipsia, polyuria, or low sugar episode.  Pt states overall good compliance with meds, mostly trying to follow appropriate diet, with wt overall stable,  but little exercise however.  Working on [employer], now off metformin due to GI upset , instead working [employer] and gym every day except sun Wt Readings from Last 3 Encounters:  07/14/20 208 lb (94.3 kg)  04/10/20 212 lb (96.2 kg)  02/25/20 222 lb 9.6 oz (101 kg)   Past Medical History:  Diagnosis Date  . Prediabetes    History reviewed. No pertinent surgical history.  reports that he has quit smoking. His smoking use included cigarettes. He has never used smokeless tobacco. He reports that he does not drink alcohol and does not use drugs. family history includes Cancer in his father; Diabetes in an other family member; Healthy in his mother. Allergies  Allergen Reactions  . Metformin And Related Other (See Comments)    Loose stools and bowel frequency   No current outpatient medications on file prior to visit.   No current facility-administered medications on file prior to visit.   Review of Systems All otherwise neg per pt    Objective:   Physical Exam BP 110/78 (BP Location: Left Arm, Patient Position: Sitting, Cuff Size: Large)   Pulse 71   Temp 98 F (36.7 C) (Oral)   Ht 5\' 8"  (1.727 m)   Wt 208 lb (94.3 kg)   SpO2 93%   BMI 31.63 kg/m  VS noted,  Constitutional: Pt appears in NAD HENT: Head: NCAT.  Right Ear: External ear normal.  Left Ear: External ear normal.  Eyes: . Pupils are equal, round, and reactive to light.  Conjunctivae and EOM are normal Nose: without d/c or deformity Neck: Neck supple. Gross normal ROM Cardiovascular: Normal rate and regular rhythm.   Pulmonary/Chest: Effort normal and breath sounds without rales or wheezing.  Abd:  Soft, NT, ND, + BS, no organomegaly Neurological: Pt is alert. At baseline orientation, motor grossly intact Skin: Skin is warm. No rashes, other new lesions, no LE edema Psychiatric: Pt behavior is normal without agitation  All otherwise neg per pt Lab Results  Component Value Date   WBC 9.6 02/25/2020   HGB 13.5 02/25/2020   HCT 43.3 02/25/2020   PLT 340.0 02/25/2020   GLUCOSE 123 (H) 02/25/2020   CHOL 208 (H) 02/15/2017   TRIG 142.0 02/15/2017   HDL 45.30 02/15/2017   LDLCALC 135 (H) 02/15/2017   ALT 59 (H) 02/25/2020   AST 34 02/25/2020   NA 137 02/25/2020   K 3.7 02/25/2020   CL 102 02/25/2020   CREATININE 0.91 02/25/2020   BUN 13 02/25/2020   CO2 31 02/25/2020   TSH 0.71 02/15/2017   PSA 0.16 02/15/2017   HGBA1C 6.9 (H) 02/25/2020      Assessment & Plan:

## 2020-07-14 NOTE — Patient Instructions (Signed)
Your A1c was Ok today  Please continue all other medications as before, and refills have been done if requested.  Please have the pharmacy call with any other refills you may need.  Please continue your efforts at being more active, low cholesterol diet, and weight control.  You are otherwise up to date with prevention measures today.  Please keep your appointments with your specialists as you may have planned  Please make an Appointment to return for your 1 year visit, or sooner if needed

## 2020-07-20 ENCOUNTER — Encounter: Payer: Self-pay | Admitting: Internal Medicine

## 2020-07-20 NOTE — Assessment & Plan Note (Signed)
stable overall by history and exam, recent data reviewed with pt, and pt to continue medical treatment as before,  to f/u any worsening symptoms or concerns  

## 2020-10-22 ENCOUNTER — Ambulatory Visit (HOSPITAL_COMMUNITY)
Admission: EM | Admit: 2020-10-22 | Discharge: 2020-10-22 | Disposition: A | Discharge status: Home / Self Care | Payer: Self-pay | Attending: Family Medicine | Admitting: Family Medicine

## 2020-10-22 ENCOUNTER — Encounter (HOSPITAL_COMMUNITY): Payer: Self-pay

## 2020-10-22 ENCOUNTER — Other Ambulatory Visit: Payer: Self-pay

## 2020-10-22 DIAGNOSIS — R531 Weakness: Secondary | ICD-10-CM

## 2020-10-22 DIAGNOSIS — R519 Headache, unspecified: Secondary | ICD-10-CM

## 2020-10-22 LAB — CBG MONITORING, ED: Glucose-Capillary: 107 mg/dL — ABNORMAL HIGH (ref 70–99)

## 2020-10-22 MED ORDER — IBUPROFEN 800 MG PO TABS: 800.0000 mg | ORAL_TABLET | Freq: Three times a day (TID) | ORAL | 0 refills | Status: DC

## 2020-10-22 NOTE — ED Notes (Signed)
Notified dr hagler of cbg 107

## 2020-10-22 NOTE — Discharge Instructions (Signed)
You have been tested for COVID-19 today. If your test returns positive, you will receive a phone call from [facility] regarding your results. Negative test results are not called. Both positive and negative results area always visible on MyChart. If you do not have a MyChart account, sign up instructions are provided in your discharge papers. Please do not hesitate to contact us should you have questions or concerns.   Please seek prompt medical care if: You have: A worsening headache that is not helped by medicine. Trouble walking or weakness in your arms and legs. Clear or bloody fluid coming from your nose or ears. Changes in your seeing (vision). Jerky movements that you cannot control (seizure). You throw up (vomit). You lose balance. Your speech is slurred. You pass out. You are sleepier and have trouble staying awake. The black centers of your eyes (pupils) change in size.  These symptoms may be an emergency. Do not wait to see if the symptoms will go away. Get medical help right away. Call your local emergency services. Do not drive yourself to the hospital.

## 2020-10-22 NOTE — ED Provider Notes (Signed)
[facility]   [id number] 10/22/20 Arrival Time: 1152  ASSESSMENT & PLAN:  1. Bad headache   2. Weakness     With body aches, question early viral illness. COVID testing sent.  Begin trial of: Meds ordered this encounter  Medications  . ibuprofen (ADVIL) 800 MG tablet    Sig: Take 1 tablet (800 mg total) by mouth 3 (three) times daily with meals.    Dispense:  12 tablet    Refill:  0   Normal neurological exam. Without fever, focal neuro logical deficits, nuchal rigidity, or change in vision. No indication for neurodiagnostic workup at this time. Discussed.  Recommend:  Follow-up Information    [facility] EMERGENCY DEPARTMENT.   Specialty: Emergency Medicine Why: If symptoms worsen in any way. Contact information: [address] [id number] mc [place] [address] [phone]             Work note provided.  Reviewed expectations re: course of current medical issues. Questions answered. Outlined signs and symptoms indicating need for more acute intervention. Patient verbalized understanding. After Visit Summary given.   SUBJECTIVE: History from: Patient Patient is able to give a clear and coherent history.  Trevor Wallace. is a 44 y.o. male who presents with complaint of a badheadache. Onset gradual, approx 2-3 d ago. Location: superior and frontal without radiation. History of headaches: occasional but not this bad. Precipitating factors include: none which have been determined. Associated symptoms: Preceding aura: no. Nausea/vomiting: no. Vision changes: no. Increased sensitivity to light and to noises: no. Fever: no. Sinus pressure/congestion: mild. Extremity weakness: no. Also reports generalized body aches. Home treatment has included nothing. Current headache has not limited normal daily activities but he did miss work today. "Feel really tired and weak". Denies dizziness, loss of balance,  muscle weakness, numbness of extremities, speech difficulties and vision problems. No head injury reported. Ambulatory without difficulty. No recent travel. Normal PO intake.  OBJECTIVE:  Vitals:   10/22/20 1240 10/22/20 1241  BP: 100/70   Pulse: 84   Resp: 18   Temp: 98.8 F (37.1 C)   TempSrc: Tympanic   SpO2: 100%   Weight:  93 kg    General appearance: alert; NAD but appears fatigued HENT: normocephalic; atraumatic Eyes: PERRLA; EOMI; conjunctivae normal Neck: supple with FROM Lungs: clear to auscultation bilaterally; unlabored respirations Heart: regular rate and rhythm Extremities: no edema; symmetrical with no gross deformities Skin: warm and dry Neurologic: alert; speech is fluent and clear without dysarthria or aphasia; CN 2-12 grossly intact; no facial droop; normal gait; normal symmetric reflexes; normal extremity strength and sensation throughout; bilateral upper and lower extremity sensation is grossly intact with 5/5 symmetric strength Psychological: alert and cooperative; normal mood and affect  Labs: Results for orders placed or performed during the hospital encounter of 10/22/20  POC CBG monitoring  Result Value Ref Range   Glucose-Capillary 107 (H) 70 - 99 mg/dL   Labs Reviewed  CBG MONITORING, ED - Abnormal; Notable for the following components:      Result Value   Glucose-Capillary 107 (*)    All other components within normal limits  SARS CORONAVIRUS 2 (TAT 6-24 HRS)    Allergies  Allergen Reactions  . Metformin And Related Other (See Comments)    Loose stools and bowel frequency    Past Medical History:  Diagnosis Date  . Prediabetes    Social History   Socioeconomic History  . Marital status: Significant Other  Spouse name: Not on file  . Number of children: Not on file  . Years of education: Not on file  . Highest education level: Not on file  Occupational History  . Not on file  Tobacco Use  . Smoking status: Former Smoker     Types: Cigarettes  . Smokeless tobacco: Never Used  Vaping Use  . Vaping Use: Never used  Substance and Sexual Activity  . Alcohol use: No    Alcohol/week: 0.0 standard drinks  . Drug use: No  . Sexual activity: Yes  Other Topics Concern  . Not on file  Social History Narrative  . Not on file   Social Determinants of Health   Financial Resource Strain: Not on file  Food Insecurity: Not on file  Transportation Needs: Not on file  Physical Activity: Not on file  Stress: Not on file  Social Connections: Not on file  Intimate Partner Violence: Not on file   Family History  Problem Relation Age of Onset  . Diabetes Other   . Healthy Mother   . Cancer Father    History reviewed. No pertinent surgical history.   Mardella Layman, MD 10/22/20 1447

## 2020-10-22 NOTE — ED Triage Notes (Signed)
Pt states he has a headache, back pain  and dry mouth x 3 days. Pt has tried the OTC meds.

## 2020-10-26 ENCOUNTER — Ambulatory Visit (HOSPITAL_COMMUNITY)
Admission: EM | Admit: 2020-10-26 | Discharge: 2020-10-26 | Disposition: A | Discharge status: Home / Self Care | Payer: Self-pay | Attending: Family Medicine | Admitting: Family Medicine

## 2020-10-26 ENCOUNTER — Other Ambulatory Visit: Payer: Self-pay

## 2020-10-26 DIAGNOSIS — U071 COVID-19: Secondary | ICD-10-CM | POA: Insufficient documentation

## 2020-10-26 LAB — RESP PANEL BY RT-PCR (FLU A&B, COVID) ARPGX2
Influenza A by PCR: NEGATIVE
Influenza B by PCR: NEGATIVE
SARS Coronavirus 2 by RT PCR: POSITIVE — AB

## 2020-10-26 NOTE — ED Provider Notes (Signed)
This was opened in error   Garey Ham, [place] 10/26/20 1316

## 2020-10-26 NOTE — ED Triage Notes (Signed)
Patient came for COVID results from visit on 12/9.  Test cancelled.  Will recollect and send down.

## 2020-10-27 ENCOUNTER — Telehealth (INDEPENDENT_AMBULATORY_CARE_PROVIDER_SITE_OTHER): Payer: HRSA Program | Admitting: Internal Medicine

## 2020-10-27 ENCOUNTER — Other Ambulatory Visit: Payer: Self-pay | Admitting: Unknown Physician Specialty

## 2020-10-27 ENCOUNTER — Telehealth: Payer: Self-pay | Admitting: Unknown Physician Specialty

## 2020-10-27 DIAGNOSIS — E119 Type 2 diabetes mellitus without complications: Secondary | ICD-10-CM

## 2020-10-27 DIAGNOSIS — E1165 Type 2 diabetes mellitus with hyperglycemia: Secondary | ICD-10-CM | POA: Diagnosis not present

## 2020-10-27 DIAGNOSIS — U071 COVID-19: Secondary | ICD-10-CM | POA: Diagnosis not present

## 2020-10-27 NOTE — Progress Notes (Signed)
Patient ID: Trevor Wallace., male   DOB: 04/11/1976, 44 y.o.   MRN: [id number]  Cumulative time during 7-day interval 11 min, there was not an associated office visit for this concern within a 7 day period.  Verbal consent for services obtained from patient prior to services given.  Names of all persons present for services: Oliver Barre, MD, patient  Chief complaint: covid infection  History, background, results pertinent:  Here to f/u with 7 days onset uri like covid symptoms, found to be covid + yesterday, now has dry mouth, HA, dizziness, little non prod cough and very mild sob.  Marland Kitchengerfj  Past Medical History:  Diagnosis Date  . Prediabetes     Results for orders placed or performed during the hospital encounter of 10/26/20 (from the past 48 hour(s))  Resp Panel by RT-PCR (Flu A&B, Covid) Nasopharyngeal Swab     Status: Abnormal   Collection Time: 10/26/20  1:20 PM   Specimen: Nasopharyngeal Swab; Nasopharyngeal(NP) swabs in vial transport medium  Result Value Ref Range   SARS Coronavirus 2 by RT PCR POSITIVE (A) NEGATIVE    Comment: emailed M. Browning 14:55 10/26/20 ([email or web address]) (NOTE) SARS-CoV-2 target nucleic acids are DETECTED.  The SARS-CoV-2 RNA is generally detectable in upper respiratory specimens during the acute phase of infection. Positive results are indicative of the presence of the identified virus, but do not rule out bacterial infection or co-infection with other pathogens not detected by the test. Clinical correlation with patient history and other diagnostic information is necessary to determine patient infection status. The expected result is Negative.  Fact Sheet for Patients: BloggerCourse.com  Fact Sheet for Healthcare Providers: SeriousBroker.it  This test is not yet approved or cleared by the [place] FDA and  has been authorized for detection and/or diagnosis of SARS-CoV-2 by FDA under an  Emergency Use Authorization (EUA).  This EUA will remain in effect (meaning this test can be used) for the duration of  the COVID-1 9 declaration under Section 564(b)(1) of the Act, 21 U.S.C. section 360bbb-3(b)(1), unless the authorization is terminated or revoked sooner.     Influenza A by PCR NEGATIVE NEGATIVE   Influenza B by PCR NEGATIVE NEGATIVE    Comment: (NOTE) The Xpert Xpress SARS-CoV-2/FLU/RSV plus assay is intended as an aid in the diagnosis of influenza from Nasopharyngeal swab specimens and should not be used as a sole basis for treatment. Nasal washings and aspirates are unacceptable for Xpert Xpress SARS-CoV-2/FLU/RSV testing.  Fact Sheet for Patients: BloggerCourse.com  Fact Sheet for Healthcare Providers: SeriousBroker.it  This test is not yet approved or cleared by the [place] FDA and has been authorized for detection and/or diagnosis of SARS-CoV-2 by FDA under an Emergency Use Authorization (EUA). This EUA will remain in effect (meaning this test can be used) for the duration of the COVID-19 declaration under Section 564(b)(1) of the Act, 21 U.S.C. section 360bbb-3(b)(1), unless the authorization is terminated or revoked.  Performed at [facility], [address], [place], [place] [address]    A/P/next steps:   covid infection - ok for referral to monoclonal Ab tx. O/w  to f/u any worsening symptoms or concerns  Oliver Barre MD

## 2020-10-27 NOTE — Telephone Encounter (Signed)
I connected by phone with Trevor Wallace. on 10/27/2020 at 11:06 AM to discuss the potential use of a new treatment for mild to moderate COVID-19 viral infection in non-hospitalized patients.  This patient is a 44 y.o. male that meets the FDA criteria for Emergency Use Authorization of COVID monoclonal antibody casirivimab/imdevimab, bamlanivimab/eteseviamb, or sotrovimab.  Has a (+) direct SARS-CoV-2 viral test result  Has mild or moderate COVID-19   Is NOT hospitalized due to COVID-19  Is within 10 days of symptom onset  Has at least one of the high risk factor(s) for progression to severe COVID-19 and/or hospitalization as defined in EUA.  Specific high risk criteria : BMI > 25, Diabetes and Cardiovascular disease or hypertension   I have spoken and communicated the following to the patient or parent/caregiver regarding COVID monoclonal antibody treatment:  1. FDA has authorized the emergency use for the treatment of mild to moderate COVID-19 in adults and pediatric patients with positive results of direct SARS-CoV-2 viral testing who are 57 years of age and older weighing at least 40 kg, and who are at high risk for progressing to severe COVID-19 and/or hospitalization.  2. The significant known and potential risks and benefits of COVID monoclonal antibody, and the extent to which such potential risks and benefits are unknown.  3. Information on available alternative treatments and the risks and benefits of those alternatives, including clinical trials.  4. Patients treated with COVID monoclonal antibody should continue to self-isolate and use infection control measures (e.g., wear mask, isolate, social distance, avoid sharing personal items, clean and disinfect high touch surfaces, and frequent handwashing) according to CDC guidelines.   5. The patient or parent/caregiver has the option to accept or refuse COVID monoclonal antibody treatment.  After reviewing this  information with the patient, the patient has agreed to receive one of the available covid 19 monoclonal antibodies and will be provided an appropriate fact sheet prior to infusion. But wants to talk to primary first today Gabriel Cirri, NP 10/27/2020 11:06 AM  Sx onset 12/7

## 2020-10-28 ENCOUNTER — Ambulatory Visit (HOSPITAL_COMMUNITY)
Admission: RE | Admit: 2020-10-28 | Discharge: 2020-10-28 | Disposition: A | Discharge status: Home / Self Care | Payer: HRSA Program | Source: Ambulatory Visit | Attending: Pulmonary Disease | Admitting: Pulmonary Disease

## 2020-10-28 DIAGNOSIS — U071 COVID-19: Secondary | ICD-10-CM | POA: Diagnosis not present

## 2020-10-28 DIAGNOSIS — E119 Type 2 diabetes mellitus without complications: Secondary | ICD-10-CM | POA: Insufficient documentation

## 2020-10-28 MED ORDER — FAMOTIDINE IN NACL 20-0.9 MG/50ML-% IV SOLN: 20.0000 mg | Freq: Once | INTRAVENOUS | Status: DC | PRN

## 2020-10-28 MED ORDER — EPINEPHRINE 0.3 MG/0.3ML IJ SOAJ: 0.3000 mg | Freq: Once | INTRAMUSCULAR | Status: DC | PRN

## 2020-10-28 MED ORDER — SODIUM CHLORIDE 0.9 % IV SOLN: INTRAVENOUS | Status: DC | PRN

## 2020-10-28 MED ORDER — SODIUM CHLORIDE 0.9 % IV SOLN: Freq: Once | INTRAVENOUS | Status: AC

## 2020-10-28 MED ORDER — DIPHENHYDRAMINE HCL 50 MG/ML IJ SOLN: 50.0000 mg | Freq: Once | INTRAMUSCULAR | Status: DC | PRN

## 2020-10-28 MED ORDER — ALBUTEROL SULFATE HFA 108 (90 BASE) MCG/ACT IN AERS: 2.0000 | INHALATION_SPRAY | Freq: Once | RESPIRATORY_TRACT | Status: DC | PRN

## 2020-10-28 MED ORDER — METHYLPREDNISOLONE SODIUM SUCC 125 MG IJ SOLR: 125.0000 mg | Freq: Once | INTRAMUSCULAR | Status: DC | PRN

## 2020-10-28 NOTE — Progress Notes (Signed)
  Diagnosis: COVID-19  Physician: Dr. Patrick Wright  Procedure: [facility]fusion [facility] Med: bamlanivimab\etesevimab infusion - Provided patient with bamlanimivab\etesevimab fact sheet for patients, parents and caregivers prior to infusion.  Complications: No immediate complications noted.  Discharge: Discharged home   Trevor Wallace 10/28/2020  

## 2020-10-28 NOTE — Progress Notes (Signed)
Patient reviewed Fact Sheet for Patients, Parents, and Caregivers for Emergency Use Authorization (EUA) of bamlanivimab and etesevimab for the Treatment of Coronavirus. Patient also reviewed and is agreeable to the estimated cost of treatment. Patient is agreeable to proceed.   

## 2020-10-28 NOTE — Discharge Instructions (Signed)
10 Things You Can Do to Manage Your COVID-19 Symptoms at Home If you have possible or confirmed COVID-19: 1. Stay home from work and school. And stay away from other public places. If you must go out, avoid using any kind of public transportation, ridesharing, or taxis. 2. Monitor your symptoms carefully. If your symptoms get worse, call your healthcare provider immediately. 3. Get rest and stay hydrated. 4. If you have a medical appointment, call the healthcare provider ahead of time and tell them that you have or may have COVID-19. 5. For medical emergencies, call 911 and notify the dispatch personnel that you have or may have COVID-19. 6. Cover your cough and sneezes with a tissue or use the inside of your elbow. 7. Wash your hands often with soap and water for at least 20 seconds or clean your hands with an alcohol-based hand sanitizer that contains at least 60% alcohol. 8. As much as possible, stay in a specific room and away from other people in your home. Also, you should use a separate bathroom, if available. If you need to be around other people in or outside of the home, wear a mask. 9. Avoid sharing personal items with other people in your household, like dishes, towels, and bedding. 10. Clean all surfaces that are touched often, like counters, tabletops, and doorknobs. Use household cleaning sprays or wipes according to the label instructions. cdc.gov/coronavirus 05/15/2019 This information is not intended to replace advice given to you by your health care provider. Make sure you discuss any questions you have with your health care provider. Document Revised: 10/17/2019 Document Reviewed: 10/17/2019 Elsevier Patient Education  2020 Elsevier Inc. What types of side effects do monoclonal antibody drugs cause?  Common side effects  In general, the more common side effects caused by monoclonal antibody drugs include: . Allergic reactions, such as hives or itching . Flu-like signs and  symptoms, including chills, fatigue, fever, and muscle aches and pains . Nausea, vomiting . Diarrhea . Skin rashes . Low blood pressure   The CDC is recommending patients who receive monoclonal antibody treatments wait at least 90 days before being vaccinated.  Currently, there are no data on the safety and efficacy of mRNA COVID-19 vaccines in persons who received monoclonal antibodies or convalescent plasma as part of COVID-19 treatment. Based on the estimated half-life of such therapies as well as evidence suggesting that reinfection is uncommon in the 90 days after initial infection, vaccination should be deferred for at least 90 days, as a precautionary measure until additional information becomes available, to avoid interference of the antibody treatment with vaccine-induced immune responses. If you have any questions or concerns after the infusion please call the Advanced Practice Provider on call at [phone]. This number is ONLY intended for your use regarding questions or concerns about the infusion post-treatment side-effects.  Please do not provide this number to others for use. For return to work notes please contact your primary care provider.   If someone you know is interested in receiving treatment please have them call the COVID hotline at [phone].   

## 2020-11-12 ENCOUNTER — Encounter: Payer: Self-pay | Admitting: Internal Medicine

## 2020-11-12 NOTE — Assessment & Plan Note (Signed)
For referral monoclonal Ab

## 2020-11-12 NOTE — Patient Instructions (Signed)
Yo will be referred for monoclonal Ab

## 2020-11-12 NOTE — Assessment & Plan Note (Signed)
Lab Results  Component Value Date   HGBA1C 6.9 (H) 02/25/2020  stable overall by history and exam, recent data reviewed with pt, and pt to continue medical treatment as before,  to f/u any worsening symptoms or concerns

## 2021-02-18 ENCOUNTER — Ambulatory Visit (HOSPITAL_COMMUNITY)
Admission: EM | Admit: 2021-02-18 | Discharge: 2021-02-18 | Disposition: A | Discharge status: Home / Self Care | Payer: Self-pay | Attending: Internal Medicine | Admitting: Internal Medicine

## 2021-02-18 ENCOUNTER — Other Ambulatory Visit: Payer: Self-pay

## 2021-02-18 ENCOUNTER — Encounter (HOSPITAL_COMMUNITY): Payer: Self-pay | Admitting: Emergency Medicine

## 2021-02-18 DIAGNOSIS — R200 Anesthesia of skin: Secondary | ICD-10-CM | POA: Insufficient documentation

## 2021-02-18 LAB — CBC WITH DIFFERENTIAL/PLATELET
Abs Immature Granulocytes: 0.05 10*3/uL (ref 0.00–0.07)
Basophils Absolute: 0.1 10*3/uL (ref 0.0–0.1)
Basophils Relative: 1 %
Eosinophils Absolute: 0.1 10*3/uL (ref 0.0–0.5)
Eosinophils Relative: 1 %
HCT: 43.6 % (ref 39.0–52.0)
Hemoglobin: 13.5 g/dL (ref 13.0–17.0)
Immature Granulocytes: 1 %
Lymphocytes Relative: 29 %
Lymphs Abs: 2.6 10*3/uL (ref 0.7–4.0)
MCH: 22.4 pg — ABNORMAL LOW (ref 26.0–34.0)
MCHC: 31 g/dL (ref 30.0–36.0)
MCV: 72.2 fL — ABNORMAL LOW (ref 80.0–100.0)
Monocytes Absolute: 1 10*3/uL (ref 0.1–1.0)
Monocytes Relative: 11 %
Neutro Abs: 5.2 10*3/uL (ref 1.7–7.7)
Neutrophils Relative %: 57 %
Platelets: 330 10*3/uL (ref 150–400)
RBC: 6.04 MIL/uL — ABNORMAL HIGH (ref 4.22–5.81)
RDW: 16.3 % — ABNORMAL HIGH (ref 11.5–15.5)
WBC: 8.9 10*3/uL (ref 4.0–10.5)
nRBC: 0 % (ref 0.0–0.2)

## 2021-02-18 LAB — COMPREHENSIVE METABOLIC PANEL
ALT: 47 U/L — ABNORMAL HIGH (ref 0–44)
AST: 28 U/L (ref 15–41)
Albumin: 3.6 g/dL (ref 3.5–5.0)
Alkaline Phosphatase: 72 U/L (ref 38–126)
Anion gap: 6 (ref 5–15)
BUN: 10 mg/dL (ref 6–20)
CO2: 25 mmol/L (ref 22–32)
Calcium: 8.7 mg/dL — ABNORMAL LOW (ref 8.9–10.3)
Chloride: 108 mmol/L (ref 98–111)
Creatinine, Ser: 0.97 mg/dL (ref 0.61–1.24)
GFR, Estimated: >60 mL/min (ref >60)
Glucose, Bld: 148 mg/dL — ABNORMAL HIGH (ref 70–99)
Potassium: 3.6 mmol/L (ref 3.5–5.1)
Sodium: 139 mmol/L (ref 135–145)
Total Bilirubin: 0.4 mg/dL (ref 0.3–1.2)
Total Protein: 5.9 g/dL — ABNORMAL LOW (ref 6.5–8.1)

## 2021-02-18 LAB — VITAMIN D 25 HYDROXY (VIT D DEFICIENCY, FRACTURES): Vit D, 25-Hydroxy: 25.54 ng/mL — ABNORMAL LOW (ref 30–100)

## 2021-02-18 LAB — VITAMIN B12: Vitamin B-12: 251 pg/mL (ref 180–914)

## 2021-02-18 MED ORDER — TIZANIDINE HCL 2 MG PO TABS: 2.0000 mg | ORAL_TABLET | Freq: Every evening | ORAL | 0 refills | Status: DC | PRN

## 2021-02-18 MED ORDER — IBUPROFEN 600 MG PO TABS: 600.0000 mg | ORAL_TABLET | Freq: Four times a day (QID) | ORAL | 0 refills | Status: DC | PRN

## 2021-02-18 NOTE — ED Triage Notes (Signed)
Pt presents with numbness in toes and finger tips. States also having hip and leg pain xs 2 weeks.

## 2021-02-18 NOTE — ED Provider Notes (Signed)
[facility]    CSN: [id number] Arrival date & time: 02/18/21  1612      History   Chief Complaint Chief Complaint  Patient presents with  . Hand Pain  . Toe Pain    HPI Trevor Wallace. is a 45 y.o. male comes to the urgent care with a 2-week history of numbness in both fingers and toes.  Symptoms started fairly suddenly and has been persistent.  Patient denies any nausea or vomiting.  No dietary restrictions.  Patient is not a vegan or vegetarian.  No diarrhea.  Patient denies any discoloration of the fingers or toes.  No weight changes.  No rash.   Patient recovered from COVID-19 infection in December 2021.  Patient also complains of low back pain which radiates into the right leg.  Associated with some numbness in the right.  No weakness in the right leg.  No trauma to the back.  Back pain is a chronic problem.  He has never sought care for the back pain.  HPI  Past Medical History:  Diagnosis Date  . Prediabetes     Patient Active Problem List   Diagnosis Date Noted  . COVID-19 virus infection 10/27/2020  . Urinary frequency 04/10/2020  . Elevated LFTs 09/27/2019  . Preventative health care 02/15/2017  . Former smoker 02/15/2017  . Dysuria 02/15/2017  . Diabetes (HCC)     History reviewed. No pertinent surgical history.     Home Medications    Prior to Admission medications   Medication Sig Start Date End Date Taking? Authorizing Provider  ibuprofen (ADVIL) 600 MG tablet Take 1 tablet (600 mg total) by mouth every 6 (six) hours as needed. 02/18/21  Yes Kwamane Whack, Britta Mccreedy, MD  tiZANidine (ZANAFLEX) 2 MG tablet Take 1 tablet (2 mg total) by mouth at bedtime as needed for muscle spasms. 02/18/21  Yes Maleak Brazzel, Britta Mccreedy, MD    Family History Family History  Problem Relation Age of Onset  . Diabetes Other   . Healthy Mother   . Cancer Father     Social History Social History   Tobacco Use  . Smoking status: Former Smoker    Types: Cigarettes   . Smokeless tobacco: Never Used  Vaping Use  . Vaping Use: Never used  Substance Use Topics  . Alcohol use: No    Alcohol/week: 0.0 standard drinks  . Drug use: No     Allergies   Metformin and related   Review of Systems Review of Systems  Constitutional: Negative.   HENT: Negative.   Respiratory: Negative.   Genitourinary: Negative.   Neurological: Positive for numbness. Negative for dizziness, syncope, weakness, light-headedness and headaches.     Physical Exam Triage Vital Signs ED Triage Vitals  Enc Vitals Group     BP 02/18/21 1627 123/69     Pulse Rate 02/18/21 1627 78     Resp 02/18/21 1627 16     Temp 02/18/21 1627 98.1 F (36.7 C)     Temp Source 02/18/21 1627 Oral     SpO2 02/18/21 1627 98 %     Weight --      Height --      Head Circumference --      Peak Flow --      Pain Score 02/18/21 1624 2     Pain Loc --      Pain Edu? --      Excl. in GC? --    No data found.  Updated Vital Signs BP 123/69 (BP Location: Right Arm)   Pulse 78   Temp 98.1 F (36.7 C) (Oral)   Resp 16   SpO2 98%   Visual Acuity Right Eye Distance:   Left Eye Distance:   Bilateral Distance:    Right Eye Near:   Left Eye Near:    Bilateral Near:     Physical Exam Vitals and nursing note reviewed.  Cardiovascular:     Pulses: Normal pulses.     Heart sounds: Normal heart sounds.  Skin:    Capillary Refill: Capillary refill takes less than 2 seconds.  Neurological:     General: No focal deficit present.     Mental Status: He is alert and oriented to person, place, and time.     Cranial Nerves: Cranial nerve deficit present.     Sensory: Sensory deficit present.     Comments: DTRs knee and biceps are 3+      UC Treatments / Results  Labs (all labs ordered are listed, but only abnormal results are displayed) Labs Reviewed  CBC WITH DIFFERENTIAL/PLATELET - Abnormal; Notable for the following components:      Result Value   RBC 6.04 (*)    MCV 72.2 (*)     MCH 22.4 (*)    RDW 16.3 (*)    All other components within normal limits  COMPREHENSIVE METABOLIC PANEL - Abnormal; Notable for the following components:   Glucose, Bld 148 (*)    Calcium 8.7 (*)    Total Protein 5.9 (*)    ALT 47 (*)    All other components within normal limits  VITAMIN D 25 HYDROXY (VIT D DEFICIENCY, FRACTURES) - Abnormal; Notable for the following components:   Vit D, 25-Hydroxy 25.54 (*)    All other components within normal limits  VITAMIN B12    EKG   Radiology No results found.  Procedures Procedures (including critical care time)  Medications Ordered in UC Medications - No data to display  Initial Impression / Assessment and Plan / UC Course  I have reviewed the triage vital signs and the nursing notes.  Pertinent labs & imaging results that were available during my care of the patient were reviewed by me and considered in my medical decision making (see chart for details).     1.  Bilateral numbness in fingers and toes: CBC, CMP, vitamin D Return to urgent care if symptoms worsen  2.  Right-sided sciatica: Zanaflex as needed for pain Ibuprofen 600 mg every 6 hours as needed for pain Gentle range of motion exercises Return precautions given.  Final Clinical Impressions(s) / UC Diagnoses   Final diagnoses:  Bilateral finger numbness     Discharge Instructions     Take medications as directed We will call you with labs if abnormal Return to urgent care if symptoms worsen.   ED Prescriptions    Medication Sig Dispense Auth. Provider   ibuprofen (ADVIL) 600 MG tablet Take 1 tablet (600 mg total) by mouth every 6 (six) hours as needed. 30 tablet Kynzi Levay, Britta Mccreedy, MD   tiZANidine (ZANAFLEX) 2 MG tablet Take 1 tablet (2 mg total) by mouth at bedtime as needed for muscle spasms. 20 tablet Shawndell Schillaci, Britta Mccreedy, MD     PDMP not reviewed this encounter.   Merrilee Jansky, MD 02/18/21 [phone]

## 2021-02-18 NOTE — Discharge Instructions (Signed)
Take medications as directed We will call you with labs if abnormal Return to urgent care if symptoms worsen.

## 2021-04-19 ENCOUNTER — Encounter (HOSPITAL_COMMUNITY): Payer: Self-pay

## 2021-04-19 ENCOUNTER — Other Ambulatory Visit: Payer: Self-pay

## 2021-04-19 ENCOUNTER — Ambulatory Visit (HOSPITAL_COMMUNITY)
Admission: EM | Admit: 2021-04-19 | Discharge: 2021-04-19 | Disposition: A | Discharge status: Home / Self Care | Payer: Self-pay | Attending: Emergency Medicine | Admitting: Emergency Medicine

## 2021-04-19 DIAGNOSIS — M5441 Lumbago with sciatica, right side: Secondary | ICD-10-CM

## 2021-04-19 DIAGNOSIS — M5440 Lumbago with sciatica, unspecified side: Secondary | ICD-10-CM

## 2021-04-19 DIAGNOSIS — M5442 Lumbago with sciatica, left side: Secondary | ICD-10-CM

## 2021-04-19 MED ORDER — PREDNISONE 20 MG PO TABS: 40.0000 mg | ORAL_TABLET | Freq: Every day | ORAL | 0 refills | Status: DC

## 2021-04-19 NOTE — ED Triage Notes (Signed)
Pt presents with ongoing numbness in both hand and lower back pain for over 2 months; pt states it was not relieved with OTC medication.

## 2021-04-19 NOTE — Discharge Instructions (Addendum)
Take 2 pills every morning for the next 5 days  If pain persist or no improvement seen, may follow up with orthopedic specialist, information below   Please follow up with primary doctor for evaluation and work up for hand tingling

## 2021-04-20 NOTE — ED Provider Notes (Signed)
[facility]    CSN: [id number] Arrival date & time: 04/19/21  1815      History   Chief Complaint Chief Complaint  Patient presents with  . Back Pain  . Numbness in Fingers    HPI Trevor Wallace. is a 45 y.o. male.   Patient presents with tingling in bilateral hands and intermittent sharp lower back pain that causes tingling down both legs. Sensation is decreased when touching objects. "feels weird and different"  Symptoms presents for 3 month. ROM intact. Has attempted use of tylenol with no relief,. Seen in UC 2 months, symptoms have persisted after prescribed treatment. History of prediabetes.   Past Medical History:  Diagnosis Date  . Prediabetes     Patient Active Problem List   Diagnosis Date Noted  . COVID-19 virus infection 10/27/2020  . Urinary frequency 04/10/2020  . Elevated LFTs 09/27/2019  . Preventative health care 02/15/2017  . Former smoker 02/15/2017  . Dysuria 02/15/2017  . Diabetes (HCC)     History reviewed. No pertinent surgical history.     Home Medications    Prior to Admission medications   Medication Sig Start Date End Date Taking? Authorizing Provider  predniSONE (DELTASONE) 20 MG tablet Take 2 tablets (40 mg total) by mouth daily. 04/19/21  Yes Aolanis Crispen R, NP  ibuprofen (ADVIL) 600 MG tablet Take 1 tablet (600 mg total) by mouth every 6 (six) hours as needed. 02/18/21   Lamptey, Britta Mccreedy, MD  tiZANidine (ZANAFLEX) 2 MG tablet Take 1 tablet (2 mg total) by mouth at bedtime as needed for muscle spasms. 02/18/21   LampteyBritta Mccreedy, MD    Family History Family History  Problem Relation Age of Onset  . Diabetes Other   . Healthy Mother   . Cancer Father     Social History Social History   Tobacco Use  . Smoking status: Former Smoker    Types: Cigarettes  . Smokeless tobacco: Never Used  Vaping Use  . Vaping Use: Never used  Substance Use Topics  . Alcohol use: No    Alcohol/week: 0.0 standard drinks  .  Drug use: No     Allergies   Metformin and related   Review of Systems Review of Systems  Respiratory: Negative.   Cardiovascular: Negative.   Musculoskeletal: Positive for back pain. Negative for arthralgias, gait problem, joint swelling, myalgias, neck pain and neck stiffness.  Skin: Negative.   Neurological: Positive for numbness. Negative for dizziness, tremors, seizures, syncope, facial asymmetry, speech difficulty, weakness, light-headedness and headaches.     Physical Exam Triage Vital Signs ED Triage Vitals  Enc Vitals Group     BP 04/19/21 1902 115/84     Pulse Rate 04/19/21 1902 80     Resp 04/19/21 1902 18     Temp 04/19/21 1902 98 F (36.7 C)     Temp Source 04/19/21 1902 Oral     SpO2 04/19/21 1902 98 %     Weight --      Height --      Head Circumference --      Peak Flow --      Pain Score 04/19/21 1859 7     Pain Loc --      Pain Edu? --      Excl. in GC? --    No data found.  Updated Vital Signs BP 115/84 (BP Location: Left Arm)   Pulse 80   Temp 98 F (36.7 C) (Oral)  Resp 18   SpO2 98%   Visual Acuity Right Eye Distance:   Left Eye Distance:   Bilateral Distance:    Right Eye Near:   Left Eye Near:    Bilateral Near:     Physical Exam Constitutional:      Appearance: Normal appearance. He is normal weight.  HENT:     Head: Normocephalic.  Eyes:     Extraocular Movements: Extraocular movements intact.  Pulmonary:     Effort: Pulmonary effort is normal.  Musculoskeletal:     Cervical back: Normal.     Thoracic back: Normal. No edema.     Lumbar back: Tenderness present. No swelling or spasms. Normal range of motion.       Back:  Skin:    General: Skin is warm and dry.  Neurological:     Mental Status: He is alert.     Cranial Nerves: Cranial nerves are intact.     Motor: Motor function is intact.     Coordination: Coordination is intact.  Psychiatric:        Mood and Affect: Mood normal.        Behavior: Behavior  normal.      UC Treatments / Results  Labs (all labs ordered are listed, but only abnormal results are displayed) Labs Reviewed - No data to display  EKG   Radiology No results found.  Procedures Procedures (including critical care time)  Medications Ordered in UC Medications - No data to display  Initial Impression / Assessment and Plan / UC Course  I have reviewed the triage vital signs and the nursing notes.  Pertinent labs & imaging results that were available during my care of the patient were reviewed by me and considered in my medical decision making (see chart for details).  Acute bilateral low back pain with sciatica  1. Prednisone 40 mg daily for 5 days 2. Orthopedic referral given 3. Advised PCP follow up for evaluation and workup of hand tingling due to persistent unchanging symptoms and possible need for specialist involvement , reviewed lab work from last visit. Stable. No signs of acute neurological involvement warranting emergent evaluation   Final Clinical Impressions(s) / UC Diagnoses   Final diagnoses:  Acute bilateral low back pain with sciatica, sciatica laterality unspecified     Discharge Instructions     Take 2 pills every morning for the next 5 days  If pain persist or no improvement seen, may follow up with orthopedic specialist, information below   Please follow up with primary doctor for evaluation and work up for hand tingling    ED Prescriptions    Medication Sig Dispense Auth. Provider   predniSONE (DELTASONE) 20 MG tablet Take 2 tablets (40 mg total) by mouth daily. 10 tablet Valinda Hoar, NP     PDMP not reviewed this encounter.   Valinda Hoar, NP 04/20/21 1121

## 2021-06-09 ENCOUNTER — Ambulatory Visit (INDEPENDENT_AMBULATORY_CARE_PROVIDER_SITE_OTHER): Payer: 59 | Admitting: Internal Medicine

## 2021-06-09 ENCOUNTER — Encounter: Payer: Self-pay | Admitting: Internal Medicine

## 2021-06-09 ENCOUNTER — Other Ambulatory Visit: Payer: Self-pay

## 2021-06-09 VITALS — BP 124/88 | HR 86 | Temp 97.9°F | Ht 68.0 in | Wt 209.0 lb

## 2021-06-09 DIAGNOSIS — E559 Vitamin D deficiency, unspecified: Secondary | ICD-10-CM

## 2021-06-09 DIAGNOSIS — R269 Unspecified abnormalities of gait and mobility: Secondary | ICD-10-CM

## 2021-06-09 DIAGNOSIS — E538 Deficiency of other specified B group vitamins: Secondary | ICD-10-CM

## 2021-06-09 DIAGNOSIS — M5412 Radiculopathy, cervical region: Secondary | ICD-10-CM

## 2021-06-09 DIAGNOSIS — E1165 Type 2 diabetes mellitus with hyperglycemia: Secondary | ICD-10-CM | POA: Diagnosis not present

## 2021-06-09 DIAGNOSIS — G959 Disease of spinal cord, unspecified: Secondary | ICD-10-CM | POA: Insufficient documentation

## 2021-06-09 DIAGNOSIS — Z0001 Encounter for general adult medical examination with abnormal findings: Secondary | ICD-10-CM

## 2021-06-09 DIAGNOSIS — G2581 Restless legs syndrome: Secondary | ICD-10-CM | POA: Diagnosis not present

## 2021-06-09 LAB — BASIC METABOLIC PANEL
BUN: 16 mg/dL (ref 6–23)
CO2: 30 mEq/L (ref 19–32)
Calcium: 8.6 mg/dL (ref 8.4–10.5)
Chloride: 105 mEq/L (ref 96–112)
Creatinine, Ser: 0.86 mg/dL (ref 0.40–1.50)
GFR: 104.43 mL/min (ref >60.00)
Glucose, Bld: 93 mg/dL (ref 70–99)
Potassium: 4.1 mEq/L (ref 3.5–5.1)
Sodium: 141 mEq/L (ref 135–145)

## 2021-06-09 LAB — URINALYSIS, ROUTINE W REFLEX MICROSCOPIC
Bilirubin Urine: NEGATIVE
Hgb urine dipstick: NEGATIVE
Ketones, ur: NEGATIVE
Leukocytes,Ua: NEGATIVE
Nitrite: NEGATIVE
RBC / HPF: NONE SEEN (ref 0–?)
Specific Gravity, Urine: >=1.03 — AB (ref 1.000–1.030)
Total Protein, Urine: NEGATIVE
Urine Glucose: NEGATIVE
Urobilinogen, UA: 1 (ref 0.0–1.0)
pH: 6 (ref 5.0–8.0)

## 2021-06-09 LAB — VITAMIN B12: Vitamin B-12: 254 pg/mL (ref 211–911)

## 2021-06-09 LAB — PSA: PSA: 0.15 ng/mL (ref 0.10–4.00)

## 2021-06-09 LAB — HEPATIC FUNCTION PANEL
ALT: 34 U/L (ref 0–53)
AST: 23 U/L (ref 0–37)
Albumin: 3.7 g/dL (ref 3.5–5.2)
Alkaline Phosphatase: 73 U/L (ref 39–117)
Bilirubin, Direct: 0.1 mg/dL (ref 0.0–0.3)
Total Bilirubin: 0.3 mg/dL (ref 0.2–1.2)
Total Protein: 5.7 g/dL — ABNORMAL LOW (ref 6.0–8.3)

## 2021-06-09 LAB — CBC WITH DIFFERENTIAL/PLATELET
Basophils Absolute: 0 10*3/uL (ref 0.0–0.1)
Basophils Relative: 0.4 % (ref 0.0–3.0)
Eosinophils Absolute: 0.1 10*3/uL (ref 0.0–0.7)
Eosinophils Relative: 0.9 % (ref 0.0–5.0)
HCT: 45.3 % (ref 39.0–52.0)
Hemoglobin: 14.1 g/dL (ref 13.0–17.0)
Lymphocytes Relative: 27.3 % (ref 12.0–46.0)
Lymphs Abs: 2.9 10*3/uL (ref 0.7–4.0)
MCHC: 31.1 g/dL (ref 30.0–36.0)
MCV: 71.2 fl — ABNORMAL LOW (ref 78.0–100.0)
Monocytes Absolute: 1.2 10*3/uL — ABNORMAL HIGH (ref 0.1–1.0)
Monocytes Relative: 10.8 % (ref 3.0–12.0)
Neutro Abs: 6.5 10*3/uL (ref 1.4–7.7)
Neutrophils Relative %: 60.6 % (ref 43.0–77.0)
Platelets: 342 10*3/uL (ref 150.0–400.0)
RBC: 6.36 Mil/uL — ABNORMAL HIGH (ref 4.22–5.81)
RDW: 17.3 % — ABNORMAL HIGH (ref 11.5–15.5)
WBC: 10.7 10*3/uL — ABNORMAL HIGH (ref 4.0–10.5)

## 2021-06-09 LAB — MICROALBUMIN / CREATININE URINE RATIO
Creatinine,U: 247.5 mg/dL
Microalb Creat Ratio: 0.3 mg/g (ref 0.0–30.0)
Microalb, Ur: <0.7 mg/dL (ref 0.0–1.9)

## 2021-06-09 LAB — VITAMIN D 25 HYDROXY (VIT D DEFICIENCY, FRACTURES): VITD: 14.28 ng/mL — ABNORMAL LOW (ref 30.00–100.00)

## 2021-06-09 LAB — TSH: TSH: 1.54 u[IU]/mL (ref 0.35–5.50)

## 2021-06-09 LAB — LIPID PANEL
Cholesterol: 196 mg/dL (ref 0–200)
HDL: 40.1 mg/dL (ref >39.00)
NonHDL: 156.07
Total CHOL/HDL Ratio: 5
Triglycerides: 233 mg/dL — ABNORMAL HIGH (ref 0.0–149.0)
VLDL: 46.6 mg/dL — ABNORMAL HIGH (ref 0.0–40.0)

## 2021-06-09 LAB — HEMOGLOBIN A1C: Hgb A1c MFr Bld: 6.2 % (ref 4.6–6.5)

## 2021-06-09 LAB — LDL CHOLESTEROL, DIRECT: Direct LDL: 122 mg/dL

## 2021-06-09 MED ORDER — GABAPENTIN 300 MG PO CAPS: ORAL_CAPSULE | ORAL | 1 refills | Status: DC

## 2021-06-09 NOTE — Patient Instructions (Signed)
Please take all new medication as prescribed - the gabapentin at bedtime  Please continue all other medications as before, and refills have been done if requested.  Please have the pharmacy call with any other refills you may need.  Please continue your efforts at being more active, low cholesterol diet, and weight control.  You are otherwise up to date with prevention measures today.  Please keep your appointments with your specialists as you may have planned  You will be contacted regarding the referral for: neurology, and MRI for the neck  Please go to the LAB at the blood drawing area for the tests to be done  You will be contacted by phone if any changes need to be made immediately.  Otherwise, you will receive a letter about your results with an explanation, but please check with MyChart first.  Please remember to sign up for MyChart if you have not done so, as this will be important to you in the future with finding out test results, communicating by private email, and scheduling acute appointments online when needed.  Please make an Appointment to return in 6 months, or sooner if needed

## 2021-06-09 NOTE — Progress Notes (Signed)
Chief Complaint:: wellness exam and Numbness (Pt state that his finger tips and arms get numb at times. Pt states that he feels numbness in legs. X 2 month) , legs jumping to go to bed, ongoing LBP, difficulty with ambulation, and new onset DM        HPI:  Daily Crate. is a 45 y.o. male here for wellness exam; declines covid vax, pneumovax, optho and colonoscopy referrals, hep c screen, o/w up to date with preventive referrals and immunizations                        Also c/o intermittent paresthesias to various locations to fingertips, arms and legs associated with neck pain, low back pain and some change and difficulty with ambulation in the past 3-6 months.  Also has increased leg jumping at night very uncomfortable but none during the day.  Pt denies chest pain, increased sob or doe, wheezing, orthopnea, PND, increased LE swelling, palpitations, dizziness or syncope.   Pt denies polydipsia, polyuria, or other new focal neuro s/s.   Pt denies fever, wt loss, night sweats, loss of appetite, or other constitutional symptoms   Wt Readings from Last 3 Encounters:  06/09/21 209 lb (94.8 kg)  10/22/20 205 lb (93 kg)  07/14/20 208 lb (94.3 kg)   BP Readings from Last 3 Encounters:  06/09/21 124/88  04/19/21 115/84  02/18/21 123/69   Immunization History  Administered Date(s) Administered   Tdap 02/15/2017   There are no preventive care reminders to display for this patient.     Past Medical History:  Diagnosis Date   Prediabetes    History reviewed. No pertinent surgical history.  reports that he has quit smoking. His smoking use included cigarettes. He has never used smokeless tobacco. He reports that he does not drink alcohol and does not use drugs. family history includes Cancer in his father; Diabetes in an other family member; Healthy in his mother. Allergies  Allergen Reactions   Metformin And Related Other (See Comments)    Loose stools and bowel frequency    Current Outpatient Medications on File Prior to Visit  Medication Sig Dispense Refill   ibuprofen (ADVIL) 600 MG tablet Take 1 tablet (600 mg total) by mouth every 6 (six) hours as needed. (Patient not taking: Reported on 06/09/2021) 30 tablet 0   predniSONE (DELTASONE) 20 MG tablet Take 2 tablets (40 mg total) by mouth daily. (Patient not taking: Reported on 06/09/2021) 10 tablet 0   tiZANidine (ZANAFLEX) 2 MG tablet Take 1 tablet (2 mg total) by mouth at bedtime as needed for muscle spasms. (Patient not taking: Reported on 06/09/2021) 20 tablet 0   No current facility-administered medications on file prior to visit.        ROS:  All others reviewed and negative.  Objective        PE:  BP 124/88   Pulse 86   Temp 97.9 F (36.6 C) (Oral)   Ht 5\' 8"  (1.727 m)   Wt 209 lb (94.8 kg)   SpO2 96%   BMI 31.78 kg/m                 Constitutional: Pt appears in NAD               HENT: Head: NCAT.                Right Ear: External ear normal.  Left Ear: External ear normal.                Eyes: . Pupils are equal, round, and reactive to light. Conjunctivae and EOM are normal               Nose: without d/c or deformity               Neck: Neck supple. Gross normal ROM               Cardiovascular: Normal rate and regular rhythm.                 Pulmonary/Chest: Effort normal and breath sounds without rales or wheezing.                Abd:  Soft, NT, ND, + BS, no organomegaly               Neurological: Pt is alert. At baseline orientation, motor grossly intact with RUE mild 4+/5 weakness               Skin: Skin is warm. No rashes, no other new lesions, LE edema - none               Psychiatric: Pt behavior is normal without agitation   Micro: none  Cardiac tracings I have personally interpreted today:  none  Pertinent Radiological findings (summarize): none   Lab Results  Component Value Date   WBC 10.7 (H) 06/09/2021   HGB 14.1 06/09/2021   HCT 45.3 06/09/2021    PLT 342.0 06/09/2021   GLUCOSE 93 06/09/2021   CHOL 196 06/09/2021   TRIG 233.0 (H) 06/09/2021   HDL 40.10 06/09/2021   LDLDIRECT 122.0 06/09/2021   LDLCALC 135 (H) 02/15/2017   ALT 34 06/09/2021   AST 23 06/09/2021   NA 141 06/09/2021   K 4.1 06/09/2021   CL 105 06/09/2021   CREATININE 0.86 06/09/2021   BUN 16 06/09/2021   CO2 30 06/09/2021   TSH 1.54 06/09/2021   PSA 0.15 06/09/2021   HGBA1C 6.2 06/09/2021   MICROALBUR <0.7 06/09/2021   Assessment/Plan:  Breyton Vanscyoc. is a 45 y.o. Black or African American [2] male with  has a past medical history of Prediabetes.  Encounter for well adult exam with abnormal findings Age and sex appropriate education and counseling updated with regular exercise and diet Referrals for preventative services - declines optho and colonoscopy, and hep c Immunizations addressed - declines covid vax, pneumovax Smoking counseling  - none needed Evidence for depression or other mood disorder - none significant Most recent labs reviewed. I have personally reviewed and have noted: 1) the patient's medical and social history 2) The patient's current medications and supplements 3) The patient's height, weight, and BMI have been recorded in the chart   Diabetes [facility]) Lab Results  Component Value Date   HGBA1C 6.2 06/09/2021   Stable, pt to continue current medical treatment  - diet controlled   Gait disorder For neurology referral,  to f/u any worsening symptoms or concerns  Cervical radiculopathy New worsening, for MRI c spine, and refer neurology  RLS (restless legs syndrome) Also new worsening, for gabapentin 300 - 600 qhs prn  Followup: Return in about 6 months (around 12/10/2021).  Oliver Barre, MD 06/09/2021 9:04 PM [facility] [facility]

## 2021-06-09 NOTE — Assessment & Plan Note (Signed)
Also new worsening, for gabapentin 300 - 600 qhs prn

## 2021-06-09 NOTE — Assessment & Plan Note (Signed)
New worsening, for MRI c spine, and refer neurology

## 2021-06-09 NOTE — Assessment & Plan Note (Signed)
For neurology referral,  to f/u any worsening symptoms or concerns 

## 2021-06-09 NOTE — Assessment & Plan Note (Signed)
Lab Results  Component Value Date   HGBA1C 6.2 06/09/2021   Stable, pt to continue current medical treatment  - diet controlled

## 2021-06-09 NOTE — Assessment & Plan Note (Signed)
Age and sex appropriate education and counseling updated with regular exercise and diet Referrals for preventative services - declines optho and colonoscopy, and hep c Immunizations addressed - declines covid vax, pneumovax Smoking counseling  - none needed Evidence for depression or other mood disorder - none significant Most recent labs reviewed. I have personally reviewed and have noted: 1) the patient's medical and social history 2) The patient's current medications and supplements 3) The patient's height, weight, and BMI have been recorded in the chart

## 2021-06-11 ENCOUNTER — Encounter: Payer: Self-pay | Admitting: Neurology

## 2021-06-16 ENCOUNTER — Ambulatory Visit (HOSPITAL_COMMUNITY)
Admission: EM | Admit: 2021-06-16 | Discharge: 2021-06-16 | Disposition: A | Discharge status: Home / Self Care | Payer: 59 | Attending: Student | Admitting: Student

## 2021-06-16 ENCOUNTER — Encounter (HOSPITAL_COMMUNITY): Payer: Self-pay

## 2021-06-16 ENCOUNTER — Other Ambulatory Visit: Payer: Self-pay

## 2021-06-16 DIAGNOSIS — M5412 Radiculopathy, cervical region: Secondary | ICD-10-CM | POA: Diagnosis not present

## 2021-06-16 MED ORDER — TIZANIDINE HCL 2 MG PO TABS: 2.0000 mg | ORAL_TABLET | Freq: Every evening | ORAL | 0 refills | Status: DC | PRN

## 2021-06-16 MED ORDER — PREDNISONE 20 MG PO TABS: 40.0000 mg | ORAL_TABLET | Freq: Every day | ORAL | 0 refills | Status: DC

## 2021-06-16 NOTE — ED Triage Notes (Signed)
Pt states he is not able to move neck to left side, has sharp pain down neck and back.  Started: yesterday

## 2021-06-16 NOTE — Discharge Instructions (Addendum)
-  Start the muscle relaxer-Zanaflex (tizanidine), up to 3 times daily for muscle spasms and pain.  This can make you drowsy, so take at bedtime or when you do not need to drive or operate machinery. -Prednisone, 2 pills taken at the same time for 5 days in a row.  Try taking this earlier in the day as it can give you energy. Avoid NSAIDs like ibuprofen and alleve while taking this medication as they can increase your risk of stomach upset and even GI bleeding when in combination with a steroid. You can continue tylenol (acetaminophen) up to 1000mg  3x daily. -If symptoms persist in 3-4 days, follow-up with an orthopedist. I recommend [facility] at [address] [place], [place]. You can schedule an appointment by calling [phone]) or online [phone]), but they also have a walk-in clinic M-F 8a-8p and Sat 10a-3p. -Follow-up with neurologist as scheduled in September

## 2021-06-16 NOTE — ED Provider Notes (Signed)
[facility]    CSN: [id number] Arrival date & time: 06/16/21  1138      History   Chief Complaint Chief Complaint  Patient presents with   Neck Pain    HPI Trevor Wallace. is a 45 y.o. male presenting with L neck pain x1 day (acute exacerbation of chronic). Pt with cervical radiculitis and tingling in bilateral fingers at baseline, this is unchanged.  Denies new trauma or overuse.  Has not tried any medications for the symptoms.  He actually has an appointment with neurologist in about a month, but states he cannot wait until then. Denies pain shooting down legs, denies numbness in arms/legs, denies weakness in arms/legs, denies saddle anesthesia, denies bowel/bladder incontinence, denies urinary retention, denies constipation.   HPI  Past Medical History:  Diagnosis Date   Prediabetes     Patient Active Problem List   Diagnosis Date Noted   Cervical radiculopathy 06/09/2021   Gait disorder 06/09/2021   RLS (restless legs syndrome) 06/09/2021   COVID-19 virus infection 10/27/2020   Urinary frequency 04/10/2020   Elevated LFTs 09/27/2019   Encounter for well adult exam with abnormal findings 02/15/2017   Former smoker 02/15/2017   Dysuria 02/15/2017   Diabetes (HCC)     History reviewed. No pertinent surgical history.     Home Medications    Prior to Admission medications   Medication Sig Start Date End Date Taking? Authorizing Provider  gabapentin (NEURONTIN) 300 MG capsule 1-2 tab by mouth at bedtime as needed 06/09/21  Yes Corwin Levins, MD  predniSONE (DELTASONE) 20 MG tablet Take 2 tablets (40 mg total) by mouth daily. 06/16/21   Rhys Martini, PA-C  tiZANidine (ZANAFLEX) 2 MG tablet Take 1 tablet (2 mg total) by mouth at bedtime as needed for muscle spasms. 06/16/21   Rhys Martini, PA-C    Family History Family History  Problem Relation Age of Onset   Diabetes Other    Healthy Mother    Cancer Father     Social History Social  History   Tobacco Use   Smoking status: Former    Types: Cigarettes   Smokeless tobacco: Never  Vaping Use   Vaping Use: Never used  Substance Use Topics   Alcohol use: No    Alcohol/week: 0.0 standard drinks   Drug use: No     Allergies   Metformin and related   Review of Systems Review of Systems  Constitutional:  Negative for chills, fever and unexpected weight change.  Respiratory:  Negative for chest tightness and shortness of breath.   Cardiovascular:  Negative for chest pain and palpitations.  Gastrointestinal:  Negative for abdominal pain, diarrhea, nausea and vomiting.  Genitourinary:  Negative for decreased urine volume, difficulty urinating and frequency.  Musculoskeletal:  Positive for neck pain. Negative for arthralgias, back pain, gait problem, joint swelling, myalgias and neck stiffness.  Skin:  Negative for wound.  Neurological:  Negative for dizziness, tremors, seizures, syncope, facial asymmetry, speech difficulty, weakness, light-headedness, numbness and headaches.  All other systems reviewed and are negative.   Physical Exam Triage Vital Signs ED Triage Vitals  Enc Vitals Group     BP 06/16/21 1213 126/81     Pulse Rate 06/16/21 1213 84     Resp 06/16/21 1213 18     Temp 06/16/21 1213 99 F (37.2 C)     Temp Source 06/16/21 1213 Oral     SpO2 06/16/21 1213 97 %  Weight --      Height --      Head Circumference --      Peak Flow --      Pain Score 06/16/21 1212 10     Pain Loc --      Pain Edu? --      Excl. in GC? --    No data found.  Updated Vital Signs BP 126/81 (BP Location: Right Arm)   Pulse 84   Temp 99 F (37.2 C) (Oral)   Resp 18   SpO2 97%   Visual Acuity Right Eye Distance:   Left Eye Distance:   Bilateral Distance:    Right Eye Near:   Left Eye Near:    Bilateral Near:     Physical Exam Vitals reviewed.  Constitutional:      General: He is not in acute distress.    Appearance: Normal appearance. He is not  ill-appearing.  HENT:     Head: Normocephalic and atraumatic.  Cardiovascular:     Rate and Rhythm: Normal rate and regular rhythm.     Heart sounds: Normal heart sounds.  Pulmonary:     Effort: Pulmonary effort is normal.     Breath sounds: Normal breath sounds and air entry.  Abdominal:     Tenderness: There is no abdominal tenderness. There is no right CVA tenderness, left CVA tenderness, guarding or rebound.  Musculoskeletal:     Cervical back: Normal range of motion. Spasms and tenderness present. No swelling, deformity, signs of trauma, rigidity, bony tenderness or crepitus. No pain with movement.     Thoracic back: No swelling, deformity, signs of trauma, spasms, tenderness or bony tenderness. Normal range of motion. No scoliosis.     Lumbar back: No swelling, deformity, signs of trauma, spasms, tenderness or bony tenderness. Normal range of motion. Negative right straight leg raise test and negative left straight leg raise test. No scoliosis.     Comments: L cervical paraspinous muscle tenderness to palpation, left proximal trapezius tenderness to palpation.  Pain elicited with flexion and extension cervical spine.  Strength and sensation intact upper and lower extremities, grip strength 5/5, radial pulse 2+.  Positive left Spurling, negative right Spurling. No midline spinous tenderness, deformity, stepoff.  Absolutely no other injury, deformity, tenderness, ecchymosis, abrasion.  Neurological:     General: No focal deficit present.     Mental Status: He is alert.     Cranial Nerves: No cranial nerve deficit.  Psychiatric:        Mood and Affect: Mood normal.        Behavior: Behavior normal.        Thought Content: Thought content normal.        Judgment: Judgment normal.     UC Treatments / Results  Labs (all labs ordered are listed, but only abnormal results are displayed) Labs Reviewed - No data to display  EKG   Radiology No results  found.  Procedures Procedures (including critical care time)  Medications Ordered in UC Medications - No data to display  Initial Impression / Assessment and Plan / UC Course  I have reviewed the triage vital signs and the nursing notes.  Pertinent labs & imaging results that were available during my care of the patient were reviewed by me and considered in my medical decision making (see chart for details).     This patient is a very pleasant 45 y.o. year old male presenting with cervical radiculitis- acute exacerbation of chronic.  He actually has an appointment with neurology in about 1 month.  No red flag symptoms today. Has been seen at our urgent care for this multiple times in the past for this issue.  Diabetes- A1c 6.2 on 06/09/21. Prednisone and zanaflex as below.  Follow-up with neurology as scheduled, follow-up with Ortho if he cannot wait for this appointment. Red flag symptoms and ED return precautions discussed. Patient verbalizes understanding and agreement.   Final Clinical Impressions(s) / UC Diagnoses   Final diagnoses:  Cervical radiculitis     Discharge Instructions      -Start the muscle relaxer-Zanaflex (tizanidine), up to 3 times daily for muscle spasms and pain.  This can make you drowsy, so take at bedtime or when you do not need to drive or operate machinery. -Prednisone, 2 pills taken at the same time for 5 days in a row.  Try taking this earlier in the day as it can give you energy. Avoid NSAIDs like ibuprofen and alleve while taking this medication as they can increase your risk of stomach upset and even GI bleeding when in combination with a steroid. You can continue tylenol (acetaminophen) up to 1000mg  3x daily. -If symptoms persist in 3-4 days, follow-up with an orthopedist. I recommend [facility] at [address], [place], [place]. You can schedule an appointment by calling [phone]) or online [phone]), but they also have a  walk-in clinic M-F 8a-8p and Sat 10a-3p. -Follow-up with neurologist as scheduled in September       ED Prescriptions     Medication Sig Dispense Auth. Provider   predniSONE (DELTASONE) 20 MG tablet Take 2 tablets (40 mg total) by mouth daily. 10 tablet October, PA-C   tiZANidine (ZANAFLEX) 2 MG tablet Take 1 tablet (2 mg total) by mouth at bedtime as needed for muscle spasms. 20 tablet Rhys Martini, PA-C      PDMP not reviewed this encounter.   Rhys Martini, PA-C 06/16/21 1306

## 2021-06-17 ENCOUNTER — Telehealth: Payer: Self-pay | Admitting: Internal Medicine

## 2021-06-17 NOTE — Telephone Encounter (Signed)
   [facility] calling to report insurance does not cover their facility for MRI

## 2021-07-26 NOTE — Telephone Encounter (Signed)
   Referral to be sent to [facility] for scheduling

## 2021-08-02 ENCOUNTER — Other Ambulatory Visit: Payer: Self-pay

## 2021-08-02 ENCOUNTER — Emergency Department (HOSPITAL_COMMUNITY)
Admission: EM | Admit: 2021-08-02 | Discharge: 2021-08-02 | Disposition: A | Discharge status: Home / Self Care | Payer: 59 | Attending: Emergency Medicine | Admitting: Emergency Medicine

## 2021-08-02 ENCOUNTER — Telehealth: Payer: Self-pay | Admitting: Internal Medicine

## 2021-08-02 ENCOUNTER — Emergency Department (HOSPITAL_COMMUNITY): Payer: 59

## 2021-08-02 DIAGNOSIS — Y92410 Unspecified street and highway as the place of occurrence of the external cause: Secondary | ICD-10-CM | POA: Insufficient documentation

## 2021-08-02 DIAGNOSIS — M542 Cervicalgia: Secondary | ICD-10-CM | POA: Diagnosis not present

## 2021-08-02 DIAGNOSIS — M79604 Pain in right leg: Secondary | ICD-10-CM | POA: Diagnosis not present

## 2021-08-02 DIAGNOSIS — E119 Type 2 diabetes mellitus without complications: Secondary | ICD-10-CM | POA: Insufficient documentation

## 2021-08-02 DIAGNOSIS — Z87891 Personal history of nicotine dependence: Secondary | ICD-10-CM | POA: Diagnosis not present

## 2021-08-02 DIAGNOSIS — Z8616 Personal history of COVID-19: Secondary | ICD-10-CM | POA: Diagnosis not present

## 2021-08-02 DIAGNOSIS — M5412 Radiculopathy, cervical region: Secondary | ICD-10-CM

## 2021-08-02 DIAGNOSIS — M545 Low back pain, unspecified: Secondary | ICD-10-CM | POA: Insufficient documentation

## 2021-08-02 MED ORDER — TIZANIDINE HCL 4 MG PO TABS
2.0000 mg | ORAL_TABLET | Freq: Once | ORAL | Status: AC
  Administered 2021-08-02: 2 mg via ORAL
  Filled 2021-08-02: qty 1

## 2021-08-02 MED ORDER — KETOROLAC TROMETHAMINE 30 MG/ML IJ SOLN
30.0000 mg | Freq: Once | INTRAMUSCULAR | Status: AC
  Administered 2021-08-02: 30 mg via INTRAMUSCULAR
  Filled 2021-08-02: qty 1

## 2021-08-02 MED ORDER — ACETAMINOPHEN 500 MG PO TABS
1000.0000 mg | ORAL_TABLET | Freq: Once | ORAL | Status: AC
  Administered 2021-08-02: 1000 mg via ORAL
  Filled 2021-08-02: qty 2

## 2021-08-02 MED ORDER — TIZANIDINE HCL 2 MG PO TABS: 2.0000 mg | ORAL_TABLET | Freq: Every evening | ORAL | 0 refills | Status: DC | PRN

## 2021-08-02 MED ORDER — PREDNISONE 20 MG PO TABS: 40.0000 mg | ORAL_TABLET | Freq: Every day | ORAL | 0 refills | Status: AC
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
## 2021-08-02 NOTE — ED Notes (Signed)
Back from xray

## 2021-08-02 NOTE — Telephone Encounter (Signed)
-----   Message from [employer] [place] sent at 08/02/2021 11:05 AM EDT ----- Regarding: location change Please change MRI location to [facility] due to insurance  Thank you Laurelyn Sickle

## 2021-08-02 NOTE — ED Notes (Signed)
Patient transported to X-ray 

## 2021-08-07 ENCOUNTER — Emergency Department (HOSPITAL_BASED_OUTPATIENT_CLINIC_OR_DEPARTMENT_OTHER)
Admission: EM | Admit: 2021-08-07 | Discharge: 2021-08-07 | Disposition: A | Discharge status: Home / Self Care | Payer: 59 | Attending: Emergency Medicine | Admitting: Emergency Medicine

## 2021-08-07 ENCOUNTER — Other Ambulatory Visit: Payer: Self-pay

## 2021-08-07 ENCOUNTER — Emergency Department (HOSPITAL_BASED_OUTPATIENT_CLINIC_OR_DEPARTMENT_OTHER): Payer: 59 | Admitting: Radiology

## 2021-08-07 ENCOUNTER — Emergency Department (HOSPITAL_BASED_OUTPATIENT_CLINIC_OR_DEPARTMENT_OTHER): Payer: 59

## 2021-08-07 ENCOUNTER — Encounter (HOSPITAL_BASED_OUTPATIENT_CLINIC_OR_DEPARTMENT_OTHER): Payer: Self-pay | Admitting: *Deleted

## 2021-08-07 ENCOUNTER — Ambulatory Visit (HOSPITAL_COMMUNITY)
Admission: EM | Admit: 2021-08-07 | Discharge: 2021-08-07 | Disposition: A | Discharge status: Home / Self Care | Payer: 59

## 2021-08-07 DIAGNOSIS — R112 Nausea with vomiting, unspecified: Secondary | ICD-10-CM | POA: Diagnosis not present

## 2021-08-07 DIAGNOSIS — S2231XA Fracture of one rib, right side, initial encounter for closed fracture: Secondary | ICD-10-CM | POA: Diagnosis not present

## 2021-08-07 DIAGNOSIS — S161XXA Strain of muscle, fascia and tendon at neck level, initial encounter: Secondary | ICD-10-CM | POA: Insufficient documentation

## 2021-08-07 DIAGNOSIS — R519 Headache, unspecified: Secondary | ICD-10-CM | POA: Insufficient documentation

## 2021-08-07 DIAGNOSIS — Z8616 Personal history of COVID-19: Secondary | ICD-10-CM | POA: Insufficient documentation

## 2021-08-07 DIAGNOSIS — Y92410 Unspecified street and highway as the place of occurrence of the external cause: Secondary | ICD-10-CM | POA: Insufficient documentation

## 2021-08-07 DIAGNOSIS — M549 Dorsalgia, unspecified: Secondary | ICD-10-CM | POA: Diagnosis not present

## 2021-08-07 DIAGNOSIS — R42 Dizziness and giddiness: Secondary | ICD-10-CM | POA: Insufficient documentation

## 2021-08-07 DIAGNOSIS — S299XXA Unspecified injury of thorax, initial encounter: Secondary | ICD-10-CM | POA: Diagnosis present

## 2021-08-07 DIAGNOSIS — Z87891 Personal history of nicotine dependence: Secondary | ICD-10-CM | POA: Insufficient documentation

## 2021-08-07 LAB — CBC
HCT: 42.5 % (ref 39.0–52.0)
Hemoglobin: 13.2 g/dL (ref 13.0–17.0)
MCH: 21.8 pg — ABNORMAL LOW (ref 26.0–34.0)
MCHC: 31.1 g/dL (ref 30.0–36.0)
MCV: 70.2 fL — ABNORMAL LOW (ref 80.0–100.0)
Platelets: 346 10*3/uL (ref 150–400)
RBC: 6.05 MIL/uL — ABNORMAL HIGH (ref 4.22–5.81)
RDW: 18.1 % — ABNORMAL HIGH (ref 11.5–15.5)
WBC: 17.3 10*3/uL — ABNORMAL HIGH (ref 4.0–10.5)
nRBC: 0 % (ref 0.0–0.2)

## 2021-08-07 LAB — BASIC METABOLIC PANEL
Anion gap: 8 (ref 5–15)
BUN: 22 mg/dL — ABNORMAL HIGH (ref 6–20)
CO2: 28 mmol/L (ref 22–32)
Calcium: 9.2 mg/dL (ref 8.9–10.3)
Chloride: 105 mmol/L (ref 98–111)
Creatinine, Ser: 1 mg/dL (ref 0.61–1.24)
GFR, Estimated: >60 mL/min (ref >60)
Glucose, Bld: 120 mg/dL — ABNORMAL HIGH (ref 70–99)
Potassium: 3.9 mmol/L (ref 3.5–5.1)
Sodium: 141 mmol/L (ref 135–145)

## 2021-08-07 MED ORDER — OXYCODONE-ACETAMINOPHEN 5-325 MG PO TABS: 1.0000 | ORAL_TABLET | Freq: Four times a day (QID) | ORAL | 0 refills | Status: DC | PRN

## 2021-08-07 MED ORDER — IOHEXOL 350 MG/ML SOLN
75.0000 mL | Freq: Once | INTRAVENOUS | Status: AC | PRN
  Administered 2021-08-07: 75 mL via INTRAVENOUS
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
## 2021-08-07 NOTE — Discharge Instructions (Addendum)
There is a narrowing on the right internal carotid artery of 50%.  Follow-up with your doctor for this.  Continue the MRI as planned.  Take the pain medicine to help with the rib fracture

## 2021-08-10 ENCOUNTER — Encounter: Payer: Self-pay | Admitting: Internal Medicine

## 2021-08-10 ENCOUNTER — Other Ambulatory Visit: Payer: Self-pay

## 2021-08-10 ENCOUNTER — Ambulatory Visit (HOSPITAL_COMMUNITY)
Admission: RE | Admit: 2021-08-10 | Discharge: 2021-08-10 | Disposition: A | Discharge status: Home / Self Care | Payer: 59 | Source: Ambulatory Visit | Attending: Internal Medicine | Admitting: Internal Medicine

## 2021-08-10 ENCOUNTER — Other Ambulatory Visit: Payer: Self-pay | Admitting: Internal Medicine

## 2021-08-10 DIAGNOSIS — M47812 Spondylosis without myelopathy or radiculopathy, cervical region: Secondary | ICD-10-CM

## 2021-08-10 DIAGNOSIS — M5412 Radiculopathy, cervical region: Secondary | ICD-10-CM | POA: Diagnosis present

## 2021-08-10 HISTORY — DX: Spondylosis without myelopathy or radiculopathy, cervical region: M47.812

## 2021-08-11 ENCOUNTER — Telehealth: Payer: Self-pay | Admitting: Internal Medicine

## 2021-08-11 NOTE — Telephone Encounter (Signed)
Just a FYI

## 2021-08-11 NOTE — Telephone Encounter (Signed)
[facility] FYI...   Rhonda from [facility]iology called to give a critical finding to provider on call.   Cervical Spine MRI 08/10/2021 2nd impression: At C3-C4 a cranially migrated central disc extrusion contributes to multi factorial severe spinal canal stenosis with significant spinal cord flattening. T2 hyper intense signal abnormality within the spinal cord at this level compatible with focal edema and/or myelomalacia. 3rd impression: At C4-C5 a cranially migrated broad based central disc extrusion results in moderate/severe spinal canal stenosis with mild spinal cord flattening.

## 2021-08-13 ENCOUNTER — Ambulatory Visit (INDEPENDENT_AMBULATORY_CARE_PROVIDER_SITE_OTHER): Payer: 59 | Admitting: Internal Medicine

## 2021-08-13 ENCOUNTER — Other Ambulatory Visit: Payer: Self-pay

## 2021-08-13 ENCOUNTER — Encounter: Payer: Self-pay | Admitting: Internal Medicine

## 2021-08-13 ENCOUNTER — Ambulatory Visit (INDEPENDENT_AMBULATORY_CARE_PROVIDER_SITE_OTHER): Payer: 59 | Admitting: Neurology

## 2021-08-13 ENCOUNTER — Encounter: Payer: Self-pay | Admitting: Neurology

## 2021-08-13 VITALS — BP 128/84 | HR 92 | Ht 68.0 in | Wt 216.0 lb

## 2021-08-13 VITALS — BP 120/82 | HR 78 | Temp 98.6°F | Ht 68.0 in | Wt 217.0 lb

## 2021-08-13 DIAGNOSIS — E1165 Type 2 diabetes mellitus with hyperglycemia: Secondary | ICD-10-CM | POA: Diagnosis not present

## 2021-08-13 DIAGNOSIS — G43809 Other migraine, not intractable, without status migrainosus: Secondary | ICD-10-CM | POA: Diagnosis not present

## 2021-08-13 DIAGNOSIS — M4802 Spinal stenosis, cervical region: Secondary | ICD-10-CM | POA: Diagnosis not present

## 2021-08-13 DIAGNOSIS — G43909 Migraine, unspecified, not intractable, without status migrainosus: Secondary | ICD-10-CM | POA: Insufficient documentation

## 2021-08-13 DIAGNOSIS — E559 Vitamin D deficiency, unspecified: Secondary | ICD-10-CM | POA: Diagnosis not present

## 2021-08-13 DIAGNOSIS — I6521 Occlusion and stenosis of right carotid artery: Secondary | ICD-10-CM

## 2021-08-13 DIAGNOSIS — E785 Hyperlipidemia, unspecified: Secondary | ICD-10-CM | POA: Insufficient documentation

## 2021-08-13 DIAGNOSIS — E7800 Pure hypercholesterolemia, unspecified: Secondary | ICD-10-CM

## 2021-08-13 MED ORDER — ROSUVASTATIN CALCIUM 40 MG PO TABS: 40.0000 mg | ORAL_TABLET | Freq: Every day | ORAL | 3 refills | Status: DC

## 2021-08-13 MED ORDER — RIZATRIPTAN BENZOATE 10 MG PO TBDP: 10.0000 mg | ORAL_TABLET | ORAL | 5 refills | Status: DC | PRN

## 2021-08-13 MED ORDER — CHOLECALCIFEROL 50 MCG (2000 UT) PO TABS: ORAL_TABLET | ORAL | 99 refills | Status: DC

## 2021-08-13 NOTE — Patient Instructions (Addendum)
Please take all new medication as prescribed - the generic crestor 40 mg for cholesterol,  the generic maxalt as needed for migraine  Please take OTC Vitamin D3 at 2000 units per day  Please continue all other medications as before, and refills have been done if requested.  Please have the pharmacy call with any other refills you may need.  Please continue your efforts at being more active, low cholesterol diet, and weight control.  Please keep your appointments with your specialists as you may have planned - Neurosurgury for your neck spine  You will be contacted regarding the referral for: Nutrition  Please make an Appointment to return in 3 months (after Nov 14 2021)

## 2021-08-13 NOTE — Assessment & Plan Note (Signed)
Lab Results  Component Value Date   HGBA1C 6.2 06/09/2021   Stable, pt to continue current medical treatment  - diet 

## 2021-08-13 NOTE — Assessment & Plan Note (Signed)
.   Last vitamin D Lab Results  Component Value Date   VD25OH 14.28 (L) 06/09/2021   Low, to start oral replacement

## 2021-08-13 NOTE — Assessment & Plan Note (Addendum)
Lab Results  Component Value Date   LDLCALC 135 (H) 02/15/2017   Uncontrolled, goal ldl < 70, pt to start statin crestor 40, also refer nutrition

## 2021-08-13 NOTE — Assessment & Plan Note (Signed)
Recent onset worsening typical, for maxalt odt prn,  to f/u any worsening symptoms or concerns

## 2021-08-13 NOTE — Addendum Note (Signed)
Addended by: Karl Luke A on: 08/13/2021 12:09 PM   Modules accepted: Orders

## 2021-08-13 NOTE — Progress Notes (Signed)
[facility]rology [facility] Clinic Note - Initial Visit   Date: 08/13/21  Trevor Wallace. MRN: [id number] DOB: 07-Nov-1976   Dear Dr. Jonny Ruiz:  Thank you for your kind referral of Trevor Wallace. for consultation of generalized paresthesias. Although his history is well known to you, please allow [place] to reiterate it for the purpose of our medical record. The patient was accompanied to the clinic by self.   History of Present Illness: Trevor Aken. is a 45 y.o. right-handed male with prediabetes presenting for evaluation of paresthesias.   Starting in the fall of 2021, he began having numbness of the left leg and tingling involving the fingers and hands.  Over time, he began having numbness in the right toes.  He feels that his right leg has constant stinging and pin pricks.  He has difficulty with imbalance and feels that he walks "like a disabled person".  He endorses weakness in the arms and legs, sometimes he's dropping items because of reduced grip.  He has not had any falls.  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 Referral has been placed to neurosurgery.   He lives at home with three of six children at home.  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   At C3-C4, a cranially migrated central disc extrusion contributes to multifactorial severe spinal canal stenosis with significant spinal cord flattening. T2 hyperintense signal abnormality within the spinal cord at this level compatible with focal edema and/or myelomalacia. Bilateral neural foraminal narrowing  (moderate/severe right, mild left).   At C4-C5, a cranially migrated broad-based central disc extrusion results in moderate/severe spinal canal stenosis with mild spinal cord flattening.   At C5-C6, a disc bulge contributes to mild/moderate spinal canal narrowing with contact upon the dorsal and ventral spinal cord. Mild relative right neural foraminal narrowing.   At C6-C7, a posterior disc osteophyte complex contributes to mild relative spinal canal narrowing, and moderate/severe bilateral neural foraminal narrowing.   Straightening of the expected cervical lordosis.  CTA head and neck 08/07/2021: CT HEAD IMPRESSION:   1. No acute intracranial abnormality. 2. 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   CTA HEAD AND NECK IMPRESSION:   1. No acute traumatic vascular injury within the head and neck. 2. 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 These lesions can be associated with an increased risk of stroke. 3. Otherwise negative CTA of the head and neck. No other hemodynamically significant stenosis or other significant vascular abnormality. 4. Emphysema (ICD10-J43.9).  Lab Results  Component Value Date   CHOL 196 06/09/2021   HDL 40.10 06/09/2021   LDLCALC 135 (H) 02/15/2017   LDLDIRECT 122.0 06/09/2021   TRIG 233.0 (H) 06/09/2021   CHOLHDL 5 06/09/2021     Lab Results  Component Value Date   HGBA1C 6.2 06/09/2021   Lab Results  Component Value Date   VITAMINB12 254 06/09/2021   Lab Results  Component Value Date   TSH 1.54 06/09/2021   No results found  for: Trevor Wallace  Past Medical History:  Diagnosis Date   Prediabetes     History reviewed. No pertinent surgical history.   Medications:  Outpatient Encounter Medications as of 08/13/2021  Medication Sig    gabapentin (NEURONTIN) 300 MG capsule 1-2 tab by mouth at bedtime as needed   oxyCODONE-acetaminophen (PERCOCET/ROXICET) 5-325 MG tablet Take 1-2 tablets by mouth every 6 (six) hours as needed for severe pain.   tiZANidine (ZANAFLEX) 2 MG tablet Take 1 tablet (2 mg total) by mouth at bedtime as needed for muscle spasms.   No facility-administered encounter medications on file as of 08/13/2021.    Allergies:  Allergies  Allergen Reactions   Metformin And Related Other (See Comments)    Loose stools and bowel frequency    Family History: Family History  Problem Relation Age of Onset   Healthy Mother    Cancer Father    Diabetes Other     Social History: Social History   Tobacco Use   Smoking status: Former    Types: Cigarettes   Smokeless tobacco: Never  Vaping Use   Vaping Use: Never used  Substance Use Topics   Alcohol use: No    Alcohol/week: 0.0 standard drinks   Drug use: No   Social History   Social History Narrative   Right Handed    Lives in a one story home     Vital Signs:  BP 128/84   Pulse 92   Ht 5\' 8"  (1.727 m)   Wt 216 lb (98 kg)   SpO2 98%   BMI 32.84 kg/m    Neurological Exam: MENTAL STATUS including orientation to time, place, person, recent and remote memory, attention span and concentration, language, and fund of knowledge is normal.  Speech is not dysarthric.  CRANIAL NERVES: II:  No visual field defects.  III-IV-VI: Pupils equal round and reactive to light.  Normal conjugate, extra-ocular eye movements in all directions of gaze.  No nystagmus.  No ptosis.   V:  Normal facial sensation.    VII:  Normal facial symmetry and movements.   VIII:  Normal hearing and vestibular function.   IX-X:  Normal palatal movement.   XI:  Normal shoulder shrug and head rotation.   XII:  Normal tongue strength and range of motion, no deviation or fasciculation.  MOTOR:  No atrophy, fasciculations or abnormal movements.  No pronator drift.   Upper  Extremity:  Right  Left  Deltoid  5/5   5/5   Biceps  5/5   5/5   Triceps  5/5   5/5   Infraspinatus 5/5  5/5  Medial pectoralis 5/5  5/5  Wrist extensors  5/5   5/5   Wrist flexors  5/5   5/5   Finger extensors  5/5   5/5   Finger flexors  5/5   5/5   Dorsal interossei  5/5   5/5   Abductor pollicis  5/5   5/5   Tone (Ashworth scale)  0  0   Lower Extremity:  Right  Left  Hip flexors  4/5   5/5   Hip extensors  5/5   5/5   Adductor 5-/5  5/5  Abductor 5/5  5/5  Knee flexors  5/5   5/5   Knee extensors  5/5   5/5   Dorsiflexors  5/5   5/5   Plantarflexors  5/5   5/5   Toe extensors  5/5   5/5   Toe flexors  5/5   5/5   Tone (Ashworth scale)  0  0   MSRs:  Right        Left                  brachioradialis 1+  2+  biceps 1+  2+  triceps 2+  2+  patellar 3+  3+  ankle jerk 2+  2+  Hoffman yes  yes  plantar response down  down  Bilateral 3-4 beat ankle clonus  SENSORY:  Hyperesthesia to pin prick, vibration, light touch, and temperature over the right leg and foot.  Sensation in the left leg and arms is intact.   Romberg's sign absent.   COORDINATION/GAIT: Normal finger-to- nose-finger.  Intact rapid alternating movements bilaterally.  Gait is mildly wide-based, stable, unassisted.  Stressed gait intact. Unsteady with tandem gait.    IMPRESSION: Multilevel severe cervical canal stenosis affecting C3-C7 segments manifesting with progressive paresthesias, weakness, and gait imbalance.    - We will try to expedite his referral to [facility] for surgical evaluation  2.  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  If I can answer any additional questions, I would be pleased to do so.    Sincerely,    Zeferino Mounts K. Allena Katz, DO

## 2021-08-13 NOTE — Progress Notes (Signed)
Patient ID: Trevor Kaminsky., male   DOB: 1976-04-27, 45 y.o.   MRN: [id number]        Chief Complaint: follow up HTN, low vit d, migraine, dm       HPI:  Trevor Highfill. is a 45 y.o. male here after seen per neurology this am with signficiant c spine dz referred to neurology, and finding of carotid stenosis.  Not taking Vit D.  Asks for referral to nutrition.  Has had recent onset left sided throbbing HA with photophobia and nausea recurrent moderate over the last 3 wks, not assoc with aura or other significant neuro s/s.  Pt denies chest pain, increased sob or doe, wheezing, orthopnea, PND, increased LE swelling, palpitations, dizziness or syncope.   Pt denies polydipsia, polyuria   Pt denies fever, wt loss, night sweats, loss of appetite, or other constitutional symptoms        Wt Readings from Last 3 Encounters:  08/13/21 217 lb (98.4 kg)  08/13/21 216 lb (98 kg)  08/07/21 205 lb 14.6 oz (93.4 kg)   BP Readings from Last 3 Encounters:  08/13/21 120/82  08/13/21 128/84  08/07/21 117/87         Past Medical History:  Diagnosis Date   Prediabetes    History reviewed. No pertinent surgical history.  reports that he has quit smoking. His smoking use included cigarettes. He has never used smokeless tobacco. He reports that he does not drink alcohol and does not use drugs. family history includes Cancer in his father; Diabetes in an other family member; Healthy in his mother. Allergies  Allergen Reactions   Metformin And Related Other (See Comments)    Loose stools and bowel frequency   Current Outpatient Medications on File Prior to Visit  Medication Sig Dispense Refill   gabapentin (NEURONTIN) 300 MG capsule 1-2 tab by mouth at bedtime as needed 180 capsule 1   oxyCODONE-acetaminophen (PERCOCET/ROXICET) 5-325 MG tablet Take 1-2 tablets by mouth every 6 (six) hours as needed for severe pain. 8 tablet 0   tiZANidine (ZANAFLEX) 2 MG tablet Take 1 tablet (2 mg total) by mouth  at bedtime as needed for muscle spasms. 20 tablet 0   No current facility-administered medications on file prior to visit.        ROS:  All others reviewed and negative.  Objective        PE:  BP 120/82 (BP Location: Right Arm, Patient Position: Sitting, Cuff Size: Large)   Pulse 78   Temp 98.6 F (37 C) (Oral)   Ht 5\' 8"  (1.727 m)   Wt 217 lb (98.4 kg)   SpO2 98%   BMI 32.99 kg/m                 Constitutional: Pt appears in NAD               HENT: Head: NCAT.                Right Ear: External ear normal.                 Left Ear: External ear normal.                Eyes: . Pupils are equal, round, and reactive to light. Conjunctivae and EOM are normal               Nose: without d/c or deformity  Neck: Neck supple. Gross normal ROM               Cardiovascular: Normal rate and regular rhythm.                 Pulmonary/Chest: Effort normal and breath sounds without rales or wheezing.                Abd:  Soft, NT, ND, + BS, no organomegaly               Neurological: Pt is alert. At baseline orientation, motor grossly intact               Skin: Skin is warm. No rashes, no other new lesions, LE edema - none               Psychiatric: Pt behavior is normal without agitation   Micro: none  Cardiac tracings I have personally interpreted today:  none  Pertinent Radiological findings (summarize): none   Lab Results  Component Value Date   WBC 17.3 (H) 08/07/2021   HGB 13.2 08/07/2021   HCT 42.5 08/07/2021   PLT 346 08/07/2021   GLUCOSE 120 (H) 08/07/2021   CHOL 196 06/09/2021   TRIG 233.0 (H) 06/09/2021   HDL 40.10 06/09/2021   LDLDIRECT 122.0 06/09/2021   LDLCALC 135 (H) 02/15/2017   ALT 34 06/09/2021   AST 23 06/09/2021   NA 141 08/07/2021   K 3.9 08/07/2021   CL 105 08/07/2021   CREATININE 1.00 08/07/2021   BUN 22 (H) 08/07/2021   CO2 28 08/07/2021   TSH 1.54 06/09/2021   PSA 0.15 06/09/2021   HGBA1C 6.2 06/09/2021   MICROALBUR <0.7 06/09/2021    Assessment/Plan:  Trevor Arreola. is a 45 y.o. Black or African American [2] male with  has a past medical history of Prediabetes.  Diabetes [facility]) Lab Results  Component Value Date   HGBA1C 6.2 06/09/2021   Stable, pt to continue current medical treatment  - diet   Vitamin D deficiency . Last vitamin D Lab Results  Component Value Date   VD25OH 14.28 (L) 06/09/2021   Low, to start oral replacement   HLD (hyperlipidemia) Lab Results  Component Value Date   LDLCALC 135 (H) 02/15/2017   Uncontrolled, goal ldl < 70, pt to start statin crestor 40, also refer nutrition    Migraine Recent onset worsening typical, for maxalt odt prn,  to f/u any worsening symptoms or concerns  Followup: Return in about 3 months (around 11/12/2021).  Oliver Barre, MD 08/13/2021 7:35 PM [facility] Medicine

## 2021-09-08 DIAGNOSIS — M4802 Spinal stenosis, cervical region: Secondary | ICD-10-CM | POA: Diagnosis not present

## 2021-09-08 DIAGNOSIS — G992 Myelopathy in diseases classified elsewhere: Secondary | ICD-10-CM | POA: Diagnosis not present

## 2021-09-21 ENCOUNTER — Ambulatory Visit (HOSPITAL_COMMUNITY)
Admission: EM | Admit: 2021-09-21 | Discharge: 2021-09-21 | Disposition: A | Discharge status: Home / Self Care | Payer: 59 | Attending: Urgent Care | Admitting: Urgent Care

## 2021-09-21 ENCOUNTER — Other Ambulatory Visit: Payer: Self-pay

## 2021-09-21 ENCOUNTER — Encounter (HOSPITAL_COMMUNITY): Payer: Self-pay

## 2021-09-21 DIAGNOSIS — M4802 Spinal stenosis, cervical region: Secondary | ICD-10-CM

## 2021-09-21 DIAGNOSIS — M5010 Cervical disc disorder with radiculopathy, unspecified cervical region: Secondary | ICD-10-CM

## 2021-09-21 DIAGNOSIS — G8929 Other chronic pain: Secondary | ICD-10-CM

## 2021-09-21 DIAGNOSIS — M542 Cervicalgia: Secondary | ICD-10-CM

## 2021-09-21 MED ORDER — TIZANIDINE HCL 4 MG PO TABS: 4.0000 mg | ORAL_TABLET | Freq: Every day | ORAL | 5 refills | Status: DC

## 2021-09-21 MED ORDER — PREDNISONE 20 MG PO TABS: ORAL_TABLET | ORAL | 0 refills | Status: DC
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
## 2021-09-29 ENCOUNTER — Encounter: Payer: Self-pay | Admitting: Dietician

## 2021-09-29 ENCOUNTER — Other Ambulatory Visit: Payer: Self-pay

## 2021-09-29 ENCOUNTER — Encounter: Payer: 59 | Attending: Internal Medicine | Admitting: Dietician

## 2021-09-29 DIAGNOSIS — E1165 Type 2 diabetes mellitus with hyperglycemia: Secondary | ICD-10-CM | POA: Diagnosis present

## 2021-09-29 NOTE — Patient Instructions (Addendum)
Out of the [facility] [employer] [address] Saturday 8 am -12 pm  [employer] [address], [place], [place] [address] Saturdays from 10-11 am. Get there early to ensure you get a spot!  Check your feet each day when you take your socks and shoes off to check for any cuts, sores, swelling, or discoloration.  Work towards eating three meals a day, about 5-6 hours apart!  Begin to recognize carbohydrates in your food choices!  Begin to build your meals using the proportions of the Balanced Plate. First, select your carb choice(s) for the meal. Next, select your source of protein to pair with your carb choices. Choose LOW FAT proteins. Finally, complete the remaining half of your meal with a variety of non-starchy vegetables.
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
## 2021-10-15 ENCOUNTER — Ambulatory Visit (INDEPENDENT_AMBULATORY_CARE_PROVIDER_SITE_OTHER): Payer: 59 | Admitting: Internal Medicine

## 2021-10-15 ENCOUNTER — Other Ambulatory Visit: Payer: Self-pay

## 2021-10-15 ENCOUNTER — Encounter: Payer: Self-pay | Admitting: Internal Medicine

## 2021-10-15 VITALS — BP 136/90 | HR 88 | Temp 98.6°F | Ht 68.0 in | Wt 217.0 lb

## 2021-10-15 DIAGNOSIS — M25511 Pain in right shoulder: Secondary | ICD-10-CM | POA: Diagnosis not present

## 2021-10-15 DIAGNOSIS — R269 Unspecified abnormalities of gait and mobility: Secondary | ICD-10-CM

## 2021-10-15 DIAGNOSIS — G959 Disease of spinal cord, unspecified: Secondary | ICD-10-CM

## 2021-10-15 DIAGNOSIS — E559 Vitamin D deficiency, unspecified: Secondary | ICD-10-CM

## 2021-10-15 DIAGNOSIS — E1165 Type 2 diabetes mellitus with hyperglycemia: Secondary | ICD-10-CM

## 2021-10-15 DIAGNOSIS — M25512 Pain in left shoulder: Secondary | ICD-10-CM

## 2021-10-15 MED ORDER — MELOXICAM 15 MG PO TABS: 15.0000 mg | ORAL_TABLET | Freq: Every day | ORAL | 1 refills | Status: DC
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
## 2021-10-15 NOTE — Patient Instructions (Signed)
You are given the work note today  Please take all new medication as prescribed - the anti-inflammatory (mobic) for pain as needed  You will be contacted regarding the referral for: Sports medicine  Please see the surgeon when you are ready for the neck surgury  Please continue all other medications as before, and refills have been done if requested.  Please have the pharmacy call with any other refills you may need.  Please keep your appointments with your specialists as you may have planned  Please make an Appointment to return in 6 months, or sooner if needed
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
## 2021-10-19 ENCOUNTER — Ambulatory Visit (INDEPENDENT_AMBULATORY_CARE_PROVIDER_SITE_OTHER): Payer: 59 | Admitting: Sports Medicine

## 2021-10-19 ENCOUNTER — Other Ambulatory Visit: Payer: Self-pay

## 2021-10-19 VITALS — BP 120/80 | HR 83 | Ht 68.0 in | Wt 226.0 lb

## 2021-10-19 DIAGNOSIS — M9908 Segmental and somatic dysfunction of rib cage: Secondary | ICD-10-CM

## 2021-10-19 DIAGNOSIS — M9902 Segmental and somatic dysfunction of thoracic region: Secondary | ICD-10-CM

## 2021-10-19 DIAGNOSIS — M9901 Segmental and somatic dysfunction of cervical region: Secondary | ICD-10-CM | POA: Diagnosis not present

## 2021-10-19 DIAGNOSIS — M542 Cervicalgia: Secondary | ICD-10-CM | POA: Diagnosis not present

## 2021-10-19 DIAGNOSIS — M9905 Segmental and somatic dysfunction of pelvic region: Secondary | ICD-10-CM

## 2021-10-19 DIAGNOSIS — M546 Pain in thoracic spine: Secondary | ICD-10-CM | POA: Diagnosis not present

## 2021-10-19 DIAGNOSIS — G4486 Cervicogenic headache: Secondary | ICD-10-CM

## 2021-10-19 DIAGNOSIS — M9903 Segmental and somatic dysfunction of lumbar region: Secondary | ICD-10-CM

## 2021-10-19 NOTE — Patient Instructions (Addendum)
Good to see you  Pick up Meloxicam and start taking  Neck HEP  2 week follow up for repeat OMT

## 2021-10-20 ENCOUNTER — Encounter: Payer: Self-pay | Admitting: Internal Medicine

## 2021-10-20 NOTE — Assessment & Plan Note (Signed)
C/w bicipital tendonitis - for mobic prn, refer sport medicine

## 2021-10-20 NOTE — Assessment & Plan Note (Signed)
Last vitamin D Lab Results  Component Value Date   VD25OH 14.28 (L) 06/09/2021   Low, reminded to start oral replacement

## 2021-10-20 NOTE — Assessment & Plan Note (Signed)
subjectively worsening per pt last 6 mo, encouraged to f/u ortho as above

## 2021-10-20 NOTE — Assessment & Plan Note (Signed)
Pt encouraged to f/u orthopedic for proposed surgury due to risk of worsening and spinal cord damage

## 2021-10-20 NOTE — Assessment & Plan Note (Signed)
Lab Results  Component Value Date   HGBA1C 6.2 06/09/2021   Stable, pt to continue current medical treatment  - diet
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
## 2021-11-02 ENCOUNTER — Ambulatory Visit (INDEPENDENT_AMBULATORY_CARE_PROVIDER_SITE_OTHER): Payer: 59 | Admitting: Sports Medicine

## 2021-11-02 ENCOUNTER — Other Ambulatory Visit: Payer: Self-pay

## 2021-11-02 VITALS — BP 130/70 | HR 71 | Ht 68.0 in | Wt 222.0 lb

## 2021-11-02 DIAGNOSIS — M9901 Segmental and somatic dysfunction of cervical region: Secondary | ICD-10-CM | POA: Diagnosis not present

## 2021-11-02 DIAGNOSIS — M542 Cervicalgia: Secondary | ICD-10-CM

## 2021-11-02 DIAGNOSIS — S46811D Strain of other muscles, fascia and tendons at shoulder and upper arm level, right arm, subsequent encounter: Secondary | ICD-10-CM

## 2021-11-02 DIAGNOSIS — M9902 Segmental and somatic dysfunction of thoracic region: Secondary | ICD-10-CM

## 2021-11-02 DIAGNOSIS — G4486 Cervicogenic headache: Secondary | ICD-10-CM | POA: Diagnosis not present

## 2021-11-02 DIAGNOSIS — M9905 Segmental and somatic dysfunction of pelvic region: Secondary | ICD-10-CM

## 2021-11-02 DIAGNOSIS — M9903 Segmental and somatic dysfunction of lumbar region: Secondary | ICD-10-CM

## 2021-11-02 DIAGNOSIS — M9908 Segmental and somatic dysfunction of rib cage: Secondary | ICD-10-CM

## 2021-11-02 NOTE — Patient Instructions (Addendum)
Good to see you  Referral for PT  3/4 week follow up Tylenol

## 2021-11-12 ENCOUNTER — Ambulatory Visit: Payer: 59 | Admitting: Internal Medicine

## 2021-11-22 ENCOUNTER — Ambulatory Visit: Payer: 59 | Admitting: Internal Medicine
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
## 2021-11-23 ENCOUNTER — Ambulatory Visit: Payer: 59 | Admitting: Sports Medicine

## 2021-11-26 ENCOUNTER — Other Ambulatory Visit: Payer: Self-pay

## 2021-11-26 ENCOUNTER — Ambulatory Visit: Payer: 59 | Attending: Sports Medicine

## 2021-11-26 DIAGNOSIS — G4486 Cervicogenic headache: Secondary | ICD-10-CM | POA: Diagnosis not present

## 2021-11-26 DIAGNOSIS — M436 Torticollis: Secondary | ICD-10-CM | POA: Diagnosis not present

## 2021-11-26 DIAGNOSIS — S46811D Strain of other muscles, fascia and tendons at shoulder and upper arm level, right arm, subsequent encounter: Secondary | ICD-10-CM | POA: Diagnosis not present

## 2021-11-26 DIAGNOSIS — M542 Cervicalgia: Secondary | ICD-10-CM | POA: Insufficient documentation

## 2021-11-26 DIAGNOSIS — M9902 Segmental and somatic dysfunction of thoracic region: Secondary | ICD-10-CM | POA: Insufficient documentation

## 2021-11-26 DIAGNOSIS — R29898 Other symptoms and signs involving the musculoskeletal system: Secondary | ICD-10-CM | POA: Insufficient documentation

## 2021-11-26 DIAGNOSIS — M9901 Segmental and somatic dysfunction of cervical region: Secondary | ICD-10-CM | POA: Diagnosis not present

## 2021-11-26 DIAGNOSIS — G8929 Other chronic pain: Secondary | ICD-10-CM | POA: Insufficient documentation

## 2021-11-26 DIAGNOSIS — M9903 Segmental and somatic dysfunction of lumbar region: Secondary | ICD-10-CM | POA: Diagnosis not present

## 2021-11-26 DIAGNOSIS — M545 Low back pain, unspecified: Secondary | ICD-10-CM | POA: Insufficient documentation

## 2021-11-26 DIAGNOSIS — M9905 Segmental and somatic dysfunction of pelvic region: Secondary | ICD-10-CM | POA: Insufficient documentation

## 2021-11-26 DIAGNOSIS — M9908 Segmental and somatic dysfunction of rib cage: Secondary | ICD-10-CM | POA: Diagnosis not present
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
## 2021-12-03 ENCOUNTER — Ambulatory Visit: Payer: 59

## 2021-12-03 ENCOUNTER — Telehealth: Payer: Self-pay
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
## 2021-12-03 NOTE — Telephone Encounter (Signed)
LVM re: today's no show appt. Reminded pt of attendance policy and his upcoming appt on1/25/23, 8:45.

## 2021-12-08 ENCOUNTER — Ambulatory Visit: Payer: 59

## 2021-12-08 ENCOUNTER — Other Ambulatory Visit: Payer: Self-pay

## 2021-12-08 DIAGNOSIS — R29898 Other symptoms and signs involving the musculoskeletal system: Secondary | ICD-10-CM | POA: Diagnosis not present

## 2021-12-08 DIAGNOSIS — M542 Cervicalgia: Secondary | ICD-10-CM

## 2021-12-08 DIAGNOSIS — G4486 Cervicogenic headache: Secondary | ICD-10-CM

## 2021-12-08 DIAGNOSIS — M545 Low back pain, unspecified: Secondary | ICD-10-CM

## 2021-12-08 DIAGNOSIS — M436 Torticollis: Secondary | ICD-10-CM | POA: Diagnosis not present

## 2021-12-08 DIAGNOSIS — M9905 Segmental and somatic dysfunction of pelvic region: Secondary | ICD-10-CM | POA: Diagnosis not present

## 2021-12-08 DIAGNOSIS — S46811D Strain of other muscles, fascia and tendons at shoulder and upper arm level, right arm, subsequent encounter: Secondary | ICD-10-CM | POA: Diagnosis not present

## 2021-12-08 DIAGNOSIS — M9908 Segmental and somatic dysfunction of rib cage: Secondary | ICD-10-CM | POA: Diagnosis not present

## 2021-12-08 DIAGNOSIS — M9903 Segmental and somatic dysfunction of lumbar region: Secondary | ICD-10-CM | POA: Diagnosis not present

## 2021-12-08 DIAGNOSIS — M9901 Segmental and somatic dysfunction of cervical region: Secondary | ICD-10-CM | POA: Diagnosis not present

## 2021-12-08 DIAGNOSIS — M9902 Segmental and somatic dysfunction of thoracic region: Secondary | ICD-10-CM | POA: Diagnosis not present

## 2021-12-08 DIAGNOSIS — G8929 Other chronic pain: Secondary | ICD-10-CM | POA: Diagnosis not present

## 2021-12-08 NOTE — Therapy (Signed)
OUTPATIENT PHYSICAL THERAPY TREATMENT NOTE   Patient Name: Trevor Wallace. MRN: [id number] DOB:1976/02/10, 46 y.o., male Today's Date: 12/08/2021  PCP: Biagio Borg, MD REFERRING PROVIDER: Biagio Borg, MD   PT End of Session - 12/08/21 2107     Visit Number 3    Number of Visits 17    Authorization Type [employer]    Progress Note Due on Visit 10    PT Start Time 0854    PT Stop Time 0938    PT Time Calculation (min) 44 min    Activity Tolerance Patient tolerated treatment well;Patient limited by pain    Behavior During Therapy [facility] for tasks assessed/performed             Past Medical History:  Diagnosis Date   Prediabetes    History reviewed. No pertinent surgical history. Patient Active Problem List   Diagnosis Date Noted   Right shoulder pain 10/15/2021   Vitamin D deficiency 08/13/2021   HLD (hyperlipidemia) 08/13/2021   Migraine 08/13/2021   Cervical myelopathy ([place]) 06/09/2021   Gait disorder 06/09/2021   RLS (restless legs syndrome) 06/09/2021   COVID-19 virus infection 10/27/2020   Urinary frequency 04/10/2020   Elevated LFTs 09/27/2019   Encounter for well adult exam with abnormal findings 02/15/2017   Former smoker 02/15/2017   Dysuria 02/15/2017   Diabetes ([place])     REFERRING DIAG:  M54.2 (ICD-10-CM) - Neck pain  G44.86 (ICD-10-CM) - Cervicogenic headache  S46.811D (ICD-10-CM) - Strain of right trapezius muscle, subsequent encounter  M99.01 (ICD-10-CM) - Somatic dysfunction of cervical region  M99.02 (ICD-10-CM) - Somatic dysfunction of thoracic region  M99.03 (ICD-10-CM) - Somatic dysfunction of lumbar region  M99.05 (ICD-10-CM) - Somatic dysfunction of pelvic region  M99.08 (ICD-10-CM) - Somatic dysfunction of rib region    THERAPY DIAG:  Neck pain  Neck stiffness  Chronic low back pain without sciatica, unspecified back pain laterality  Cervicogenic headache   SUBJECTIVE:                                                                                                                                                                                                           SUBJECTIVE STATEMENT: I've been aching. I had an episode at work on 12/05/21 where I was not able to remember my duties at work. EMS said I had a concussion. I return back to work Midwife.   PERTINENT HISTORY:  Prediabetes; arthritis   PAIN:  Are you having pain? 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   At C3-C4, a cranially migrated central disc extrusion contributes to multifactorial severe spinal canal stenosis with significant spinal cord flattening. T2 hyperintense signal abnormality within the spinal cord at this level compatible with focal edema and/or myelomalacia. Bilateral neural foraminal narrowing (moderate/severe right, mild left).   At C4-C5, a cranially migrated broad-based central disc extrusion results in moderate/severe spinal canal stenosis with mild spinal cord flattening.   At C5-C6, a disc bulge contributes to mild/moderate spinal  canal narrowing with contact upon the dorsal and ventral spinal cord. Mild relative right neural foraminal narrowing.   At C6-C7, a posterior disc osteophyte complex contributes to mild relative spinal canal narrowing, and moderate/severe bilateral neural foraminal narrowing.   Straightening of the expected cervical lordosis   CT 08/07/21 neck and low back IMPRESSION: 1. No acute displaced fracture or traumatic listhesis of the cervical spine. 2. No acute displaced fracture or traumatic listhesis of the lumbar spine. 3. Aortic Atherosclerosis (ICD10-I70.0) and Emphysema (ICD10-J43.9). 4. Please see separately dictated CT angiography head and neck 08/07/2021.   PATIENT SURVEYS:  FOTO 42% functional status     COGNITION: Overall cognitive status: Within functional limits for tasks assessed            SENSATION: Light touch: Appears intact. Pt reports intermittent n/t of his bilat finger tips and bilat ant. thighs and lat. calves     POSTURE:  Forward head , rounded shoulders   PALPATION: TTP c increased muscle tension of the cervical paraspinals, upper traps, and levator         CERVICAL AROM/PROM   A/PROM A/PROM (deg) 11/27/2021  Flexion 50, burning pain at CT junction  Extension 30, sharp pain  Right lateral flexion 34, pulling pain R  Left lateral flexion 35, sharp pain R  Right rotation 50, puling pain R and L  Left rotation 56, pulling pain R   (Blank rows = not tested)   UE AROM/PROM:                     - Bilat shoulder AROMs WNLs   UE MMT:          -UE myotomal screen was negative   DTR:          - bicep, tricep and BR were +2 bilat   CERVICAL SPECIAL TESTS:  Spurling's test: Positive R, Neg L     FUNCTIONAL TESTS:  TBA   LUMBAR ASSESSMENT: 12/08/21  SENSATION:  Light touch: Appears intact  PALPATION: Tender to lumba paraspinals  LUMBARAROM/PROM  A/PROM A/PROM  12/08/2021  Flexion 16' from floor, marked limitation, pulling pain of low  back  Extension Min  limitation, pressure of low back   Right lateral flexion 23" from floor, marked limitation, pulling pain L low back  Left lateral flexion 23' from floor, marked limitation, pulling pain R low back and sharp L low back pain  Right rotation Full movement, pullin pain L low back  Left rotation Full movement, min R low back pain   (Blank rows = not tested)  LE MMT:  Myotomal screen neg  DTR: Patellar and Achilles +2 and equal L and R  LUMBAR SPECIAL TESTS:  Slump test: Negative SI compression and distraction Neg  FUNCTIONAL TESTS:  5 times sit to stand: TBA 2 minute walk test: TBA  GAIT: Distance walked: 200' in clinic Assistive device utilized: None Level of assistance: Complete Independence Comments: Heel toe pattern, decreased pace     [facility] Adult PT Treatment:                                                DATE: 12/08/21 Therapeutic Exercise: Piriformis stretch 2x10, 30" Bridging, 2x10, 3 sec Seated flexion, forward and lateral, 20" Cervical rotation, x5, 5 sec  Self Care: Eval findings, POC, HEP    TODAY'S TREATMENT: Eval 11/26/21 Cervical retraction x10, 3"     PATIENT EDUCATION:  Education details: Eval findings, POC, HEP Person educated: Patient Education method: Explanation, Demonstration, Tactile cues, Verbal cues, and Handouts Education comprehension: verbalized understanding, returned demonstration, verbal cues required, and tactile cues required     HOME EXERCISE PROGRAM: Access Code: [facility] URL: [email or web address] Date: 11/27/2021 Prepared by: Gar Ponto   Exercises Seated Passive Cervical Retraction - 1 x daily - 7 x weekly - 3 sets - 10 reps     ASSESSMENT:   CLINICAL IMPRESSION: PT was completed for lumbar assessment. Pt experienced increased pain most consistently at the end range of lumbar movements reporting a pulling discomfort. Lumbopelvic flexibility and strengthening exercises were initiated.  Cervical mobility was also started and retractions placed on hold due to pt reporting increased pain with this ex. Patient will benefit from skilled PT to address above impairments and improve overall function.   REHAB POTENTIAL: Good   CLINICAL DECISION MAKING: Stable/uncomplicated   EVALUATION COMPLEXITY: Low     GOALS:   SHORT TERM GOALS:   STG Name Target Date Goal status  1 Pt will be Ind in an initial HEP Baseline: started on eval 12/17/21 INITIAL  2 Pt will voice understanding of measures to decrease and manage pain Baseline:  12/17/21 INITIAL    LONG TERM GOALS:    LTG Name Target Date Goal status  1 Increase pt's cervical AROM by 5d for all motions for improved cervical function Baseline:see flow sheets 01/28/22 INITIAL  2 Pt will report improved tolerance to work related activities by 75% Baseline: 01/28/22 INITIAL  3 Pt will reports and decrease in neck/R shoulder and low back pain to 3/10 or < with daily activities Baseline: See flow sheets 01/28/22 INITIAL  4 Pt's FOTO functional status score will improve to the predicted value of 60% Baseline: 42% 01/28/22 INITIAL  5 Pt will be Ind in a final HEP to maintain achieved LOF Baseline: 01/28/22 INITIAL  6 Pt's trunk ROMs with improve to at least min limitations for improved functional mobility 01/28/22 INITIAL  7 Complete 5xSTS and 2MWT and set goals for assessment of function  01/28/22 INITIAL  PLAN: PT FREQUENCY: 2x/week   PT DURATION: 8 weeks   PLANNED INTERVENTIONS: Therapeutic exercises, Therapeutic activity, Neuro Muscular re-education, Patient/Family education, Joint mobilization, Dry Needling, Electrical stimulation, Spinal mobilization, Cryotherapy, Moist heat, Taping, Traction, Ultrasound, Ionotophoresis 4mg /ml Dexamethasone, and Manual therapy   PLAN FOR NEXT SESSION: Assess response to HEP, review FOTO, progress therex as indicated, utlilize modalities and manual therapy as indicated, complete 5xSTS and 2MWT      Xayvier Vallez MS, PT 12/08/21 9:56 PM

## 2021-12-09 NOTE — Therapy (Incomplete)
OUTPATIENT PHYSICAL THERAPY TREATMENT NOTE   Patient Name: Trevor Wallace. MRN: [id number] DOB:1976-08-11, 46 y.o., male Today's Date: 12/09/2021  PCP: Corwin Levins, MD REFERRING PROVIDER: Richardean Sale, DO     Past Medical History:  Diagnosis Date   Prediabetes    No past surgical history on file. Patient Active Problem List   Diagnosis Date Noted   Right shoulder pain 10/15/2021   Vitamin D deficiency 08/13/2021   HLD (hyperlipidemia) 08/13/2021   Migraine 08/13/2021   Cervical myelopathy ([facility]) 06/09/2021   Gait disorder 06/09/2021   RLS (restless legs syndrome) 06/09/2021   COVID-19 virus infection 10/27/2020   Urinary frequency 04/10/2020   Elevated LFTs 09/27/2019   Encounter for well adult exam with abnormal findings 02/15/2017   Former smoker 02/15/2017   Dysuria 02/15/2017   Diabetes ([facility])     REFERRING DIAG:  M54.2 (ICD-10-CM) - Neck pain  G44.86 (ICD-10-CM) - Cervicogenic headache  S46.811D (ICD-10-CM) - Strain of right trapezius muscle, subsequent encounter  M99.01 (ICD-10-CM) - Somatic dysfunction of cervical region  M99.02 (ICD-10-CM) - Somatic dysfunction of thoracic region  M99.03 (ICD-10-CM) - Somatic dysfunction of lumbar region  M99.05 (ICD-10-CM) - Somatic dysfunction of pelvic region  M99.08 (ICD-10-CM) - Somatic dysfunction of rib region    THERAPY DIAG:  No diagnosis found.   SUBJECTIVE:                                                                                                                                                                                                          SUBJECTIVE STATEMENT: ***   PERTINENT HISTORY:  Prediabetes; arthritis   PAIN:  Are you having pain? 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   At C3-C4, a cranially migrated central disc extrusion contributes to multifactorial severe spinal canal stenosis with significant spinal cord flattening. T2 hyperintense signal abnormality within the spinal cord at this level compatible with focal edema and/or myelomalacia. Bilateral neural foraminal narrowing (moderate/severe right, mild left).   At C4-C5, a cranially migrated broad-based central disc extrusion results in moderate/severe spinal canal stenosis with mild spinal cord flattening.   At C5-C6, a disc bulge contributes to mild/moderate spinal canal narrowing with contact upon the dorsal and ventral spinal cord. Mild relative right neural foraminal narrowing.   At C6-C7, a posterior disc osteophyte complex contributes to mild relative spinal canal narrowing, and moderate/severe bilateral neural foraminal narrowing.   Straightening of the expected cervical lordosis   CT 08/07/21 neck and low back IMPRESSION: 1. No acute displaced fracture or traumatic listhesis of the cervical spine. 2. No acute displaced fracture or traumatic listhesis of the lumbar spine. 3. Aortic Atherosclerosis (ICD10-I70.0) and Emphysema  (ICD10-J43.9). 4. Please see separately dictated CT angiography head and neck 08/07/2021.   PATIENT SURVEYS:  FOTO 42% functional status     COGNITION: Overall cognitive status: Within functional limits for tasks assessed            SENSATION: Light touch: Appears intact. Pt reports intermittent n/t of his bilat finger tips and bilat ant. thighs and lat. calves     POSTURE:  Forward head , rounded shoulders   PALPATION: TTP c increased muscle tension of the cervical paraspinals, upper traps, and levator         CERVICAL AROM/PROM   A/PROM A/PROM (deg) 11/27/2021  Flexion 50, burning pain at CT junction  Extension 30, sharp pain  Right lateral flexion 34, pulling pain R  Left lateral flexion 35, sharp pain R  Right rotation 50, puling pain R and L  Left rotation 56, pulling pain R   (Blank rows = not tested)   UE AROM/PROM:                     - Bilat shoulder AROMs WNLs   UE MMT:          -UE myotomal screen was negative   DTR:          - bicep, tricep and BR were +2 bilat   CERVICAL SPECIAL TESTS:  Spurling's test: Positive R, Neg L     FUNCTIONAL TESTS:  TBA   LUMBAR ASSESSMENT: 12/08/21  SENSATION:  Light touch: Appears intact  PALPATION: Tender to lumba paraspinals  LUMBARAROM/PROM  A/PROM A/PROM  12/09/2021  Flexion 16' from floor, marked limitation, pulling pain of low back  Extension Min limitation, pressure of low back   Right lateral flexion 23" from floor, marked limitation, pulling pain L low back  Left lateral flexion 23' from floor, marked limitation, pulling pain R low back and sharp L low back pain  Right rotation Full movement, pullin pain L low back  Left rotation Full movement, min R low back pain   (Blank rows = not tested)  LE MMT:  Myotomal screen neg  DTR: Patellar and Achilles +2 and equal L and R  LUMBAR SPECIAL TESTS:  Slump test: Negative SI compression and distraction Neg  FUNCTIONAL TESTS:  5 times sit to stand:  TBA 2 minute walk test: TBA  GAIT: Distance walked: 200' in clinic Assistive device utilized: None Level of assistance: Complete Independence Comments: Heel toe pattern, decreased pace  [facility] Adult PT Treatment:                                                DATE: 12/10/2021 Therapeutic Exercise: *** Manual Therapy: *** Neuromuscular re-ed: *** Therapeutic Activity: *** Modalities: *** Self Care: ***   [facility] Adult PT Treatment:                                                DATE: 12/08/21 Therapeutic Exercise: Piriformis stretch 2x10, 30" Bridging, 2x10, 3 sec Seated flexion, forward and lateral, 20" Cervical rotation, x5, 5 sec  Self Care: Eval findings, POC, HEP    TODAY'S TREATMENT: Eval 11/26/21 Cervical retraction x10, 3"     PATIENT EDUCATION:  Education details: *** Person educated: Patient Education method: Explanation, Demonstration, Actor cues, Verbal cues, and Handouts Education comprehension: verbalized understanding, returned demonstration, verbal cues required, and tactile cues required     HOME EXERCISE PROGRAM: Access Code: [facility] URL: [email or web address] Date: 11/27/2021 Prepared by: Joellyn Rued   Exercises Seated Passive Cervical Retraction - 1 x daily - 7 x weekly - 3 sets - 10 reps     ASSESSMENT:   CLINICAL IMPRESSION: ***   REHAB POTENTIAL: Good   CLINICAL DECISION MAKING: Stable/uncomplicated   EVALUATION COMPLEXITY: Low     GOALS:   SHORT TERM GOALS:   STG Name Target Date Goal status  1 Pt will be Ind in an initial HEP Baseline: started on eval 12/17/21 INITIAL  2 Pt will voice understanding of measures to decrease and manage pain Baseline:  12/17/21 INITIAL    LONG TERM GOALS:    LTG Name Target Date Goal status  1 Increase pt's cervical AROM by 5d for all motions for improved cervical function Baseline:see flow sheets 01/28/22 INITIAL  2 Pt will report improved tolerance to work related  activities by 75% Baseline: 01/28/22 INITIAL  3 Pt will reports and decrease in neck/R shoulder and low back pain to 3/10 or < with daily activities Baseline: See flow sheets 01/28/22 INITIAL  4 Pt's FOTO functional status score will improve to the predicted value of 60% Baseline: 42% 01/28/22 INITIAL  5 Pt will be Ind in a final HEP to maintain achieved LOF Baseline: 01/28/22 INITIAL  6 Pt's trunk ROMs with improve to at least min limitations for improved functional mobility 01/28/22 INITIAL  7 Complete 5xSTS and and set goals for assessment of function  01/28/22 INITIAL     PLAN: PT FREQUENCY: 2x/week   PT DURATION: 8 weeks   PLANNED INTERVENTIONS: Therapeutic exercises, Therapeutic activity, Neuro Muscular re-education, Patient/Family education, Joint mobilization, Dry Needling, Electrical stimulation, Spinal mobilization, Cryotherapy, Moist heat, Taping, Traction, Ultrasound, Ionotophoresis 4mg /ml Dexamethasone, and Manual therapy   PLAN FOR NEXT SESSION: Assess response to HEP, review FOTO, progress therex as indicated, utlilize modalities and manual therapy as indicated, complete 5xSTS and     , PT, DPT 12/09/21 9:08 AM

## 2021-12-10 ENCOUNTER — Ambulatory Visit: Payer: 59

## 2021-12-10 ENCOUNTER — Telehealth: Payer: Self-pay

## 2021-12-10 NOTE — Telephone Encounter (Signed)
Spoke with pt regarding his 2nd no-show. Discussed clinic attendance policy and informed pt that he would only be able to schedule 1 visit at a time moving forward. Confirmed the pt's closest remaining appointment and provided the clinic's number to call if he needs to cancel. He reports understanding.

## 2021-12-14 NOTE — Therapy (Addendum)
OUTPATIENT PHYSICAL THERAPY TREATMENT NOTE/Discharge   Patient Name: Trevor Wallace. MRN: [id number] DOB:1976/03/30, 46 y.o., male Today's Date: 12/15/2021  PCP: Biagio Borg, MD REFERRING PROVIDER: Glennon Mac, DO   PT End of Session - 12/15/21 1333     Visit Number 4    Date for PT Re-Evaluation 01/28/22    Authorization Type [employer]    Progress Note Due on Visit 10    PT Start Time 0933    PT Stop Time 1018    PT Time Calculation (min) 45 min    Activity Tolerance Patient tolerated treatment well;Patient limited by pain    Behavior During Therapy [facility] for tasks assessed/performed              Past Medical History:  Diagnosis Date   Prediabetes    History reviewed. No pertinent surgical history. Patient Active Problem List   Diagnosis Date Noted   Right shoulder pain 10/15/2021   Vitamin D deficiency 08/13/2021   HLD (hyperlipidemia) 08/13/2021   Migraine 08/13/2021   Cervical myelopathy ([place]) 06/09/2021   Gait disorder 06/09/2021   RLS (restless legs syndrome) 06/09/2021   COVID-19 virus infection 10/27/2020   Urinary frequency 04/10/2020   Elevated LFTs 09/27/2019   Encounter for well adult exam with abnormal findings 02/15/2017   Former smoker 02/15/2017   Dysuria 02/15/2017   Diabetes ([place])     REFERRING DIAG:  M54.2 (ICD-10-CM) - Neck pain  G44.86 (ICD-10-CM) - Cervicogenic headache  S46.811D (ICD-10-CM) - Strain of right trapezius muscle, subsequent encounter  M99.01 (ICD-10-CM) - Somatic dysfunction of cervical region  M99.02 (ICD-10-CM) - Somatic dysfunction of thoracic region  M99.03 (ICD-10-CM) - Somatic dysfunction of lumbar region  M99.05 (ICD-10-CM) - Somatic dysfunction of pelvic region  M99.08 (ICD-10-CM) - Somatic dysfunction of rib region    THERAPY DIAG:  Neck pain  Neck stiffness  Chronic low back pain without sciatica, unspecified back pain laterality  Cervicogenic headache  Somatic dysfunction of  lumbar region  Decreased ROM of neck   SUBJECTIVE:                                                                                                                                                                                                          SUBJECTIVE STATEMENT: Pt reports he has been completing his HEP. Pt notes his lower neck, upper shoulders, and low back has not improved overall. He notes the stretching exs do feel good.   PERTINENT HISTORY:  Prediabetes; arthritis   PAIN:  Are you having pain? 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   At C3-C4, a cranially migrated central disc extrusion contributes to multifactorial severe spinal canal stenosis with significant spinal cord flattening. T2 hyperintense signal abnormality within the spinal cord at this level compatible with focal edema and/or myelomalacia. Bilateral neural foraminal narrowing (moderate/severe right, mild left).   At C4-C5, a cranially migrated broad-based central disc extrusion results in moderate/severe spinal canal stenosis with  mild spinal cord flattening.   At C5-C6, a disc bulge contributes to mild/moderate spinal canal narrowing with contact upon the dorsal and ventral spinal cord. Mild relative right neural foraminal narrowing.   At C6-C7, a posterior disc osteophyte complex contributes to mild relative spinal canal narrowing, and moderate/severe bilateral neural foraminal narrowing.   Straightening of the expected cervical lordosis   CT 08/07/21 neck and low back IMPRESSION: 1. No acute displaced fracture or traumatic listhesis of the cervical spine. 2. No acute displaced fracture or traumatic listhesis of the lumbar spine. 3. Aortic Atherosclerosis (ICD10-I70.0) and Emphysema (ICD10-J43.9). 4. Please see separately dictated CT angiography head and neck 08/07/2021.   PATIENT SURVEYS:  FOTO 42% functional status     COGNITION: Overall cognitive status: Within functional limits for tasks assessed            SENSATION: Light touch: Appears intact. Pt reports intermittent n/t of his bilat finger tips and bilat ant. thighs and lat. calves     POSTURE:  Forward head , rounded shoulders   PALPATION: TTP c increased muscle tension of the cervical paraspinals, upper traps, and levator         CERVICAL AROM/PROM   A/PROM A/PROM (deg) 11/27/2021  Flexion 50, burning pain at CT junction  Extension 30, sharp pain  Right lateral flexion 34, pulling pain R  Left lateral flexion 35, sharp pain R  Right rotation 50, puling pain R and L  Left rotation 56, pulling pain R   (Blank rows = not tested)   UE AROM/PROM:                     - Bilat shoulder AROMs WNLs   UE MMT:          -UE myotomal screen was negative   DTR:          - bicep, tricep and BR were +2 bilat   CERVICAL SPECIAL TESTS:  Spurling's test: Positive R, Neg L     FUNCTIONAL TESTS:  TBA   LUMBAR ASSESSMENT: 12/08/21  SENSATION:  Light touch: Appears intact  PALPATION: Tender to lumba  paraspinals  LUMBARAROM/PROM  A/PROM A/PROM  12/15/2021  Flexion 16' from floor, marked limitation,  pulling pain of low back  Extension Min limitation, pressure of low back   Right lateral flexion 23" from floor, marked limitation, pulling pain L low back  Left lateral flexion 23' from floor, marked limitation, pulling pain R low back and sharp L low back pain  Right rotation Full movement, pullin pain L low back  Left rotation Full movement, min R low back pain   (Blank rows = not tested)  LE MMT:  Myotomal screen neg  DTR: Patellar and Achilles +2 and equal L and R  LUMBAR SPECIAL TESTS:  Slump test: Negative SI compression and distraction Neg  FUNCTIONAL TESTS:  5 times sit to stand: TBA 2 minute walk test: TBA  GAIT: Distance walked: 200' in clinic Assistive device utilized: None Level of assistance: Complete Independence Comments: Heel toe pattern, decreased pace    [facility] Adult PT Treatment:                                                DATE: 2/31/23 Therapeutic Exercise: Piriformis stretch 2x10, 30" Bridging, 2x10, 3 sec PPT, hand slides up thighs for abdominal activation, x10 LTR, x5, 5" Seated flexion, forward and lateral, x6, 20" Cervical rotation, x5, 5 sec, c overpressure Cervical retraction, x5, 3"  Manual Therapy: STM for the upper traps, levator and cervical paraspinals. Increased tenderness and muscle tension was palpated.   Modalities: Trigger Point Dry Needling Treatment: Pre-treatment instruction: Patient instructed on dry needling rationale, procedures, and possible side effects including pain during treatment (achy,cramping feeling), bruising, drop of blood, lightheadedness, nausea, sweating. Patient Consent Given: Yes Education handout provided: Yes Muscles treated: Upper trap, levator, semispinalis cervicis and capitis C6 and T1 Needle size and number: .30x55m x 2 Electrical stimulation performed: No Parameters: N/A Treatment  response/outcome: Twitch response elicited and Palpable decrease in muscle tension Post-treatment instructions: Patient instructed to expect possible mild to moderate muscle soreness later today and/or tomorrow. Patient instructed in methods to reduce muscle soreness and to continue prescribed HEP. If patient was dry needled over the lung field, patient was instructed on signs and symptoms of pneumothorax and, however unlikely, to see immediate medical attention should they occur. Patient was also educated on signs and symptoms of infection and to seek medical attention should they occur. Patient verbalized understanding of these instructions and education.  Self Care: HEP was updated     [facility]                                                [facility]: 12[facility] Therapeutic Exercise: Piriformis stretch 2x10, 30" Bridging, 2x10, 3 sec Seated flexion, forward and lateral, 20" Cervical rotation, x5, 5 sec  Self Care: Eval findings, POC, HEP    TODAY'S TREATMENT: Eval 11/26/21 Cervical retraction x10, 3"     PATIENT EDUCATION:  Education details: Eval findings, POC, HEP Person educated: Patient Education method: Explanation, Demonstration, Tactile cues, Verbal cues, and Handouts Education comprehension: verbalized understanding, returned demonstration, verbal cues required, and tactile cues required     HOME EXERCISE PROGRAM: Access [facility]: [email or web address]: [email or web address] Date: 12/15/2021 Prepared by: AGar Ponto Exercises Seated Passive Cervical Retraction - 1 x daily - 7 x weekly - 3 sets - 10 reps  Seated Cervical Rotation AROM - 2 x daily - 7 x weekly - 1 sets - 10 reps - 5 hold Seated Upper Trapezius Stretch - 2 x daily - 7 x weekly - 1 sets - 3 reps - 20 hold Seated Flexion Stretch with Swiss Ball - 2 x daily - 7 x weekly - 1 sets - 6 reps - 20 hold Supine Piriformis Stretch with Foot on Ground - 2 x daily - 7 x weekly - 1 sets - 3 reps - 20  hold Supine Bridge - 2 x daily - 7 x weekly - 2 sets - 10 reps - 3 hold Supine Posterior Pelvic Tilt - 2 x daily - 7 x weekly - 2 sets - 10 reps - 3 hold Supine Lower Trunk Rotation - 2 x daily - 7 x weekly - 1 sets - 5 reps - 5 hold       ASSESSMENT:   CLINICAL IMPRESSION:  PT was completed for STM f/b TPDN to the upper traps, levator, and semispinalis. Twitch responses were elicited with muscle lengthening. Pt then completed therex for cervical and low back flexibility. Pt's response to PT has been slow. Will assess pt's response to TPDN the next PT session.   REHAB POTENTIAL: Good   CLINICAL DECISION MAKING: Stable/uncomplicated   EVALUATION COMPLEXITY: Low     GOALS:   SHORT TERM GOALS:   STG Name Target Date Goal status  1 Pt will be Ind in an initial HEP Baseline: started on eval 12/17/21 INITIAL  2 Pt will voice understanding of measures to decrease and manage pain Baseline:  12/17/21 INITIAL    LONG TERM GOALS:    LTG Name Target Date Goal status  1 Increase pt's cervical AROM by 5d for all motions for improved cervical function Baseline:see flow sheets 01/28/22 INITIAL  2 Pt will report improved tolerance to work related activities by 75% Baseline: 01/28/22 INITIAL  3 Pt will reports and decrease in neck/R shoulder and low back pain to 3/10 or < with daily activities Baseline: See flow sheets 01/28/22 INITIAL  4 Pt's FOTO functional status score will improve to the predicted value of 60% Baseline: 42% 01/28/22 INITIAL  5 Pt will be Ind in a final HEP to maintain achieved LOF Baseline: 01/28/22 INITIAL  6 Pt's trunk ROMs with improve to at least min limitations for improved functional mobility 01/28/22 INITIAL  7 Complete 5xSTS and 2MWT and set goals for assessment of function  01/28/22 INITIAL     PLAN: PT FREQUENCY: 2x/week   PT DURATION: 8 weeks   PLANNED INTERVENTIONS: Therapeutic exercises, Therapeutic activity, Neuro Muscular re-education, Patient/Family  education, Joint mobilization, Dry Needling, Electrical stimulation, Spinal mobilization, Cryotherapy, Moist heat, Taping, Traction, Ultrasound, Ionotophoresis 25m/ml Dexamethasone, and Manual therapy   PLAN FOR NEXT SESSION: Assess response to HEP, review FOTO, progress therex as indicated, utlilize modalities and manual therapy as indicated, complete 5xSTS and 2MWT     ALiberty MutualMS, PT 12/15/21 2:12 PM  PHYSICAL THERAPY DISCHARGE SUMMARY  Visits from Start of Care: 3  Current functional level related to goals / functional outcomes: unknown   Remaining deficits: unknown   Education / Equipment: HEP   Patient agrees to discharge. Patient goals were not met. Patient is being discharged due to not returning since the last visit. DCed due to attendance policy with high number of cancellations

## 2021-12-15 ENCOUNTER — Ambulatory Visit: Payer: 59 | Attending: Sports Medicine

## 2021-12-15 ENCOUNTER — Other Ambulatory Visit: Payer: Self-pay

## 2021-12-15 DIAGNOSIS — M436 Torticollis: Secondary | ICD-10-CM | POA: Diagnosis not present

## 2021-12-15 DIAGNOSIS — R29898 Other symptoms and signs involving the musculoskeletal system: Secondary | ICD-10-CM | POA: Diagnosis not present

## 2021-12-15 DIAGNOSIS — G4486 Cervicogenic headache: Secondary | ICD-10-CM | POA: Diagnosis not present

## 2021-12-15 DIAGNOSIS — M9903 Segmental and somatic dysfunction of lumbar region: Secondary | ICD-10-CM | POA: Insufficient documentation

## 2021-12-15 DIAGNOSIS — G8929 Other chronic pain: Secondary | ICD-10-CM | POA: Diagnosis not present

## 2021-12-15 DIAGNOSIS — M542 Cervicalgia: Secondary | ICD-10-CM | POA: Diagnosis not present

## 2021-12-15 DIAGNOSIS — M545 Low back pain, unspecified: Secondary | ICD-10-CM | POA: Insufficient documentation

## 2021-12-17 ENCOUNTER — Ambulatory Visit: Payer: 59

## 2021-12-23 NOTE — Therapy (Incomplete)
OUTPATIENT PHYSICAL THERAPY TREATMENT NOTE   Patient Name: Trevor Wallace. MRN: [id number] DOB:01/05/1976, 46 y.o., male Today's Date: 12/23/2021  PCP: Corwin Levins, MD REFERRING PROVIDER: Richardean Sale, DO      Past Medical History:  Diagnosis Date   Prediabetes    No past surgical history on file. Patient Active Problem List   Diagnosis Date Noted   Right shoulder pain 10/15/2021   Vitamin D deficiency 08/13/2021   HLD (hyperlipidemia) 08/13/2021   Migraine 08/13/2021   Cervical myelopathy ([facility]) 06/09/2021   Gait disorder 06/09/2021   RLS (restless legs syndrome) 06/09/2021   COVID-19 virus infection 10/27/2020   Urinary frequency 04/10/2020   Elevated LFTs 09/27/2019   Encounter for well adult exam with abnormal findings 02/15/2017   Former smoker 02/15/2017   Dysuria 02/15/2017   Diabetes ([facility])     REFERRING DIAG:  M54.2 (ICD-10-CM) - Neck pain  G44.86 (ICD-10-CM) - Cervicogenic headache  S46.811D (ICD-10-CM) - Strain of right trapezius muscle, subsequent encounter  M99.01 (ICD-10-CM) - Somatic dysfunction of cervical region  M99.02 (ICD-10-CM) - Somatic dysfunction of thoracic region  M99.03 (ICD-10-CM) - Somatic dysfunction of lumbar region  M99.05 (ICD-10-CM) - Somatic dysfunction of pelvic region  M99.08 (ICD-10-CM) - Somatic dysfunction of rib region    THERAPY DIAG:  No diagnosis found.   SUBJECTIVE:                                                                                                                                                                                                          SUBJECTIVE STATEMENT: ***   PERTINENT HISTORY:  Prediabetes; arthritis   PAIN:  Are you having pain? 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   At C3-C4, a cranially migrated central disc extrusion contributes to multifactorial severe spinal canal stenosis with significant spinal cord flattening. T2 hyperintense signal abnormality within the spinal cord at this level compatible with focal edema and/or myelomalacia. Bilateral neural foraminal narrowing (moderate/severe right, mild left).   At C4-C5, a cranially migrated broad-based central disc extrusion results in moderate/severe spinal canal stenosis with mild spinal cord flattening.   At C5-C6, a disc bulge contributes to mild/moderate spinal canal narrowing with contact upon the dorsal and ventral spinal cord. Mild relative right neural foraminal narrowing.   At C6-C7, a posterior disc osteophyte complex contributes to mild relative spinal canal narrowing, and moderate/severe bilateral neural foraminal narrowing.   Straightening of the expected cervical lordosis   CT 08/07/21 neck and low back IMPRESSION: 1. No acute displaced fracture or traumatic listhesis of the cervical spine. 2. No acute displaced fracture or traumatic listhesis of the lumbar spine. 3. Aortic Atherosclerosis (ICD10-I70.0) and Emphysema  (ICD10-J43.9). 4. Please see separately dictated CT angiography head and neck 08/07/2021.   PATIENT SURVEYS:  FOTO 42% functional status     COGNITION: Overall cognitive status: Within functional limits for tasks assessed            SENSATION: Light touch: Appears intact. Pt reports intermittent n/t of his bilat finger tips and bilat ant. thighs and lat. calves     POSTURE:  Forward head , rounded shoulders   PALPATION: TTP c increased muscle tension of the cervical paraspinals, upper traps, and levator         CERVICAL AROM/PROM   A/PROM A/PROM (deg) 11/27/2021  Flexion 50, burning pain at CT junction  Extension 30, sharp pain  Right lateral flexion 34, pulling pain R  Left lateral flexion 35, sharp pain R  Right rotation 50, puling pain R and L  Left rotation 56, pulling pain R   (Blank rows = not tested)   UE AROM/PROM:                     - Bilat shoulder AROMs WNLs   UE MMT:          -UE myotomal screen was negative   DTR:          - bicep, tricep and BR were +2 bilat   CERVICAL SPECIAL TESTS:  Spurling's test: Positive R, Neg L     FUNCTIONAL TESTS:  TBA   LUMBAR ASSESSMENT: 12/08/21  SENSATION:  Light touch: Appears intact  PALPATION: Tender to lumba paraspinals  LUMBARAROM/PROM  A/PROM A/PROM  12/23/2021  Flexion 16' from floor, marked limitation, pulling pain of low back  Extension Min limitation, pressure of low back   Right lateral flexion 23" from floor, marked limitation, pulling pain L low back  Left lateral flexion 23' from floor, marked limitation, pulling pain R low back and sharp L low back pain  Right rotation Full movement, pullin pain L low back  Left rotation Full movement, min R low back pain   (Blank rows = not tested)  LE MMT:  Myotomal screen neg  DTR: Patellar and Achilles +2 and equal L and R  LUMBAR SPECIAL TESTS:  Slump test: Negative SI compression and distraction Neg  FUNCTIONAL TESTS:  5 times sit to stand:  TBA 2 minute walk test: TBA  GAIT: Distance walked: 200' in clinic Assistive device utilized: None Level of assistance: Complete Independence Comments: Heel toe pattern, decreased  pace    [facility] Adult PT Treatment:                                                DATE: 12/24/2021 Therapeutic Exercise: *** Manual Therapy: *** Neuromuscular re-ed: *** Therapeutic Activity: *** Modalities: *** Self Care: ***   [facility] Adult PT Treatment:                                                DATE: 12/14/21 Therapeutic Exercise: Piriformis stretch 2x10, 30" Bridging, 2x10, 3 sec PPT, hand slides up thighs for abdominal activation, x10 LTR, x5, 5" Seated flexion, forward and lateral, x6, 20" Cervical rotation, x5, 5 sec, c overpressure Cervical retraction, x5, 3"  Manual Therapy: STM for the upper traps, levator and cervical paraspinals. Increased tenderness and muscle tension was palpated.   Modalities: Trigger Point Dry Needling Treatment: Pre-treatment instruction: Patient instructed on dry needling rationale, procedures, and possible side effects including pain during treatment (achy,cramping feeling), bruising, drop of blood, lightheadedness, nausea, sweating. Patient Consent Given: Yes Education handout provided: Yes Muscles treated: Upper trap, levator, semispinalis cervicis and capitis C6 and T1 Needle size and number: .30x13mm x 2 Electrical stimulation performed: No Parameters: N/A Treatment response/outcome: Twitch response elicited and Palpable decrease in muscle tension Post-treatment instructions: Patient instructed to expect possible mild to moderate muscle soreness later today and/or tomorrow. Patient instructed in methods to reduce muscle soreness and to continue prescribed HEP. If patient was dry needled over the lung field, patient was instructed on signs and symptoms of pneumothorax and, however unlikely, to see immediate medical attention should they occur. Patient was  also educated on signs and symptoms of infection and to seek medical attention should they occur. Patient verbalized understanding of these instructions and education.  Self Care: HEP was updated     [facility] Adult PT Treatment:                                                DATE: 12/08/21 Therapeutic Exercise: Piriformis stretch 2x10, 30" Bridging, 2x10, 3 sec Seated flexion, forward and lateral, 20" Cervical rotation, x5, 5 sec  Self Care: Eval findings, POC, HEP    TODAY'S TREATMENT: Eval 11/26/21 Cervical retraction x10, 3"     PATIENT EDUCATION:  Education details: Eval findings, POC, HEP Person educated: Patient Education method: Explanation, Demonstration, Tactile cues, Verbal cues, and Handouts Education comprehension: verbalized understanding, returned demonstration, verbal cues required, and tactile cues required     HOME EXERCISE PROGRAM: Access Code: [facility] URL: [email or web address] Date: 12/15/2021 Prepared by: Joellyn Rued  Exercises Seated Passive Cervical Retraction - 1 x daily - 7 x weekly - 3 sets - 10 reps Seated Cervical Rotation AROM - 2 x daily - 7 x weekly - 1 sets - 10 reps - 5 hold Seated Upper Trapezius Stretch - 2 x daily - 7 x weekly - 1 sets - 3 reps - 20 hold Seated Flexion Stretch with Swiss Ball - 2 x daily - 7 x weekly - 1 sets - 6 reps - 20 hold Supine Piriformis Stretch with Foot  on Ground - 2 x daily - 7 x weekly - 1 sets - 3 reps - 20 hold Supine Bridge - 2 x daily - 7 x weekly - 2 sets - 10 reps - 3 hold Supine Posterior Pelvic Tilt - 2 x daily - 7 x weekly - 2 sets - 10 reps - 3 hold Supine Lower Trunk Rotation - 2 x daily - 7 x weekly - 1 sets - 5 reps - 5 hold       ASSESSMENT:   CLINICAL IMPRESSION:  ***   REHAB POTENTIAL: Good   CLINICAL DECISION MAKING: Stable/uncomplicated   EVALUATION COMPLEXITY: Low     GOALS:   SHORT TERM GOALS:   STG Name Target Date Goal status  1 Pt will be Ind in an initial  HEP Baseline: started on eval 12/17/21 INITIAL  2 Pt will voice understanding of measures to decrease and manage pain Baseline:  12/17/21 INITIAL    LONG TERM GOALS:    LTG Name Target Date Goal status  1 Increase pt's cervical AROM by 5d for all motions for improved cervical function Baseline:see flow sheets 01/28/22 INITIAL  2 Pt will report improved tolerance to work related activities by 75% Baseline: 01/28/22 INITIAL  3 Pt will reports and decrease in neck/R shoulder and low back pain to 3/10 or < with daily activities Baseline: See flow sheets 01/28/22 INITIAL  4 Pt's FOTO functional status score will improve to the predicted value of 60% Baseline: 42% 01/28/22 INITIAL  5 Pt will be Ind in a final HEP to maintain achieved LOF Baseline: 01/28/22 INITIAL  6 Pt's trunk ROMs with improve to at least min limitations for improved functional mobility 01/28/22 INITIAL  7 Complete 5xSTS and and set goals for assessment of function  01/28/22 INITIAL     PLAN: PT FREQUENCY: 2x/week   PT DURATION: 8 weeks   PLANNED INTERVENTIONS: Therapeutic exercises, Therapeutic activity, Neuro Muscular re-education, Patient/Family education, Joint mobilization, Dry Needling, Electrical stimulation, Spinal mobilization, Cryotherapy, Moist heat, Taping, Traction, Ultrasound, Ionotophoresis 4mg /ml Dexamethasone, and Manual therapy   PLAN FOR NEXT SESSION: Assess response to HEP, review FOTO, progress therex as indicated, utlilize modalities and manual therapy as indicated, complete 5xSTS and     , PT, DPT 12/23/21 3:08 PM

## 2021-12-24 ENCOUNTER — Ambulatory Visit: Payer: 59

## 2021-12-30 ENCOUNTER — Encounter (HOSPITAL_COMMUNITY): Payer: Self-pay

## 2021-12-30 ENCOUNTER — Ambulatory Visit (HOSPITAL_COMMUNITY)
Admission: EM | Admit: 2021-12-30 | Discharge: 2021-12-30 | Disposition: A | Discharge status: Home / Self Care | Payer: Self-pay | Attending: Student | Admitting: Student

## 2021-12-30 DIAGNOSIS — R109 Unspecified abdominal pain: Secondary | ICD-10-CM

## 2021-12-30 LAB — POCT URINALYSIS DIPSTICK, ED / UC
Bilirubin Urine: NEGATIVE
Glucose, UA: NEGATIVE mg/dL
Ketones, ur: NEGATIVE mg/dL
Leukocytes,Ua: NEGATIVE
Nitrite: NEGATIVE
Protein, ur: NEGATIVE mg/dL
Specific Gravity, Urine: 1.025 (ref 1.005–1.030)
Urobilinogen, UA: 0.2 mg/dL (ref 0.0–1.0)
pH: 6 (ref 5.0–8.0)

## 2021-12-30 NOTE — Discharge Instructions (Addendum)
-  Head to ED if severe abdominal pain, blood in your stool, etc.

## 2021-12-30 NOTE — ED Provider Notes (Signed)
[place]    CSN: [id number] Arrival date & time: 12/30/21  Trevor Wallace      History   Chief Complaint Chief Complaint  Patient presents with   Migraine    Abdominal pain x 2- 3 days. Sharp pains started 1- 2 weeks.    HPI Trevor Wallace. is a 46 y.o. male presenting with vague complaint of headache and abdominal pain. Requesting work note.  Medical history elevated LFTs. Denies prior history of kidney ds.  Pt presents to the office for abdominal pain for several weeks. States this is worse in the left side of his abdomen.  Has not identified triggers or alleviating factors.  Has not tried any treatments at home. No prior history of abdominal procedures. Last bowel movement was this morning and was normal, he is still passing gas. Denies urinary symptoms including frequency, urgency, hematuria. Denies concern for STI.  Denies penile discharge. Pt c/o headaches for several days but none currently. Denies worst headache of life, thunderclap headache, weakness/sensation changes in arms/legs, vision changes, shortness of breath, chest pain/pressure, photophobia, phonophobia, n/v/d.      HPI  Past Medical History:  Diagnosis Date   Prediabetes     Patient Active Problem List   Diagnosis Date Noted   Right shoulder pain 10/15/2021   Vitamin D deficiency 08/13/2021   HLD (hyperlipidemia) 08/13/2021   Migraine 08/13/2021   Cervical myelopathy ([place]) 06/09/2021   Gait disorder 06/09/2021   RLS (restless legs syndrome) 06/09/2021   COVID-19 virus infection 10/27/2020   Urinary frequency 04/10/2020   Elevated LFTs 09/27/2019   Encounter for well adult exam with abnormal findings 02/15/2017   Former smoker 02/15/2017   Dysuria 02/15/2017   Diabetes ([place])     History reviewed. No pertinent surgical history.     Home Medications    Prior to Admission medications   Medication Sig Start Date End Date Taking? Authorizing Provider  Cholecalciferol 50 MCG (2000  UT) TABS 1 tab by mouth once daily 08/13/21   Biagio Borg, MD  gabapentin (NEURONTIN) 300 MG capsule 1-2 tab by mouth at bedtime as needed 06/09/21   Biagio Borg, MD  meloxicam (MOBIC) 15 MG tablet Take 1 tablet (15 mg total) by mouth daily. 10/15/21   Biagio Borg, MD  oxyCODONE-acetaminophen (PERCOCET/ROXICET) 5-325 MG tablet Take 1-2 tablets by mouth every 6 (six) hours as needed for severe pain. 08/07/21   Davonna Belling, MD  rizatriptan (MAXALT-MLT) 10 MG disintegrating tablet Take 1 tablet (10 mg total) by mouth as needed for migraine. May repeat in 2 hours if needed 08/13/21   Biagio Borg, MD  rosuvastatin (CRESTOR) 40 MG tablet Take 1 tablet (40 mg total) by mouth daily. 08/13/21   Biagio Borg, MD  tiZANidine (ZANAFLEX) 4 MG tablet Take 1 tablet (4 mg total) by mouth at bedtime. 09/21/21   Jaynee Eagles, PA-C    Family History Family History  Problem Relation Age of Onset   Healthy Mother    Cancer Father    Diabetes Other     Social History Social History   Tobacco Use   Smoking status: Former    Types: Cigarettes   Smokeless tobacco: Never  Vaping Use   Vaping Use: Never used  Substance Use Topics   Alcohol use: No    Alcohol/week: 0.0 standard drinks   Drug use: No     Allergies   Metformin and related   Review of Systems Review of Systems  Gastrointestinal:  Positive for abdominal pain.  All other systems reviewed and are negative.   Physical Exam Triage Vital Signs ED Triage Vitals  Enc Vitals Group     BP 12/30/21 1928 124/82     Pulse Rate 12/30/21 1928 71     Resp 12/30/21 1928 18     Temp 12/30/21 1928 98.7 F (37.1 C)     Temp Source 12/30/21 1928 Oral     SpO2 12/30/21 1928 100 %     Weight --      Height --      Head Circumference --      Peak Flow --      Pain Score 12/30/21 1929 9     Pain Loc --      Pain Edu? --      Excl. in [place]? --    No data found.  Updated Vital Signs BP 124/82 (BP Location: Left Arm)    Pulse 71    Temp  98.7 F (37.1 C) (Oral)    Resp 18    SpO2 100%   Visual Acuity Right Eye Distance:   Left Eye Distance:   Bilateral Distance:    Right Eye Near:   Left Eye Near:    Bilateral Near:     Physical Exam Vitals reviewed.  Constitutional:      General: He is not in acute distress.    Appearance: Normal appearance. He is not ill-appearing.  HENT:     Head: Normocephalic and atraumatic.     Mouth/Throat:     Mouth: Mucous membranes are moist.     Comments: Moist mucous membranes Eyes:     Extraocular Movements: Extraocular movements intact.     Pupils: Pupils are equal, round, and reactive to light.  Cardiovascular:     Rate and Rhythm: Normal rate and regular rhythm.     Heart sounds: Normal heart sounds.  Pulmonary:     Effort: Pulmonary effort is normal.     Breath sounds: Normal breath sounds. No wheezing, rhonchi or rales.  Abdominal:     General: Bowel sounds are normal. There is no distension.     Palpations: Abdomen is soft. There is no mass.     Tenderness: There is no abdominal tenderness. There is no right CVA tenderness, left CVA tenderness, guarding or rebound. Negative signs include Murphy's sign, Rovsing's sign and McBurney's sign.     Comments: No reproducible pain No mass, guarding, rebound, hernia. Comfortable throughout exam.   Skin:    General: Skin is warm.     Capillary Refill: Capillary refill takes less than 2 seconds.     Comments: Good skin turgor  Neurological:     General: No focal deficit present.     Mental Status: He is alert and oriented to person, place, and time.     Comments: CN 2-12 grossly intact.   Psychiatric:        Mood and Affect: Mood normal.        Behavior: Behavior normal.     UC Treatments / Results  Labs (all labs ordered are listed, but only abnormal results are displayed) Labs Reviewed  POCT URINALYSIS DIPSTICK, ED / UC - Abnormal; Notable for the following components:      Result Value   Hgb urine dipstick TRACE (*)     All other components within normal limits    EKG   Radiology No results found.  Procedures Procedures (including critical care time)  Medications  Ordered in UC Medications - No data to display  Initial Impression / Assessment and Plan / UC Course  I have reviewed the triage vital signs and the nursing notes.  Pertinent labs & imaging results that were available during my care of the patient were reviewed by me and considered in my medical decision making (see chart for details).     This patient is a very pleasant 46 y.o. year old male presenting with abd pain and requesting work note. Afebrile, nontachycardic, no reproducible abd pain or CVAT.  UA with trace blood, did not send culture. Denies STI risk.  Work note provided.   ED return precautions discussed. Patient verbalizes understanding and agreement.     Final Clinical Impressions(s) / UC Diagnoses   Final diagnoses:  Abdominal pain, unspecified abdominal location     Discharge Instructions      -Head to ED if severe abdominal pain, blood in your stool, etc.      ED Prescriptions   None    PDMP not reviewed this encounter.   Hazel Sams, PA-C 12/30/21 2027

## 2021-12-30 NOTE — ED Triage Notes (Signed)
Pt presents to the office for abdominal pain for several weeks. Last bowel movement was this morning.  Pt c/o migraines for several days.

## 2021-12-31 ENCOUNTER — Ambulatory Visit: Payer: 59 | Admitting: Physical Therapy

## 2022-01-10 ENCOUNTER — Telehealth: Payer: Self-pay | Admitting: Internal Medicine

## 2022-01-10 DIAGNOSIS — R269 Unspecified abnormalities of gait and mobility: Secondary | ICD-10-CM

## 2022-01-10 DIAGNOSIS — G959 Disease of spinal cord, unspecified: Secondary | ICD-10-CM

## 2022-01-10 NOTE — Telephone Encounter (Signed)
Pt states another referral for [facility] is required due to him missing multiple appts due to transportation issues

## 2022-01-12 NOTE — Telephone Encounter (Signed)
Ok this is done 

## 2022-01-12 NOTE — Telephone Encounter (Signed)
Patient notified about referral being placed to [facility]. ?

## 2022-01-13 ENCOUNTER — Other Ambulatory Visit: Payer: Self-pay

## 2022-01-13 ENCOUNTER — Encounter: Payer: Self-pay | Admitting: Internal Medicine

## 2022-01-13 ENCOUNTER — Ambulatory Visit (INDEPENDENT_AMBULATORY_CARE_PROVIDER_SITE_OTHER): Payer: Self-pay | Admitting: Internal Medicine

## 2022-01-13 VITALS — BP 90/68 | HR 91 | Temp 98.4°F | Ht 68.0 in | Wt 225.0 lb

## 2022-01-13 DIAGNOSIS — E559 Vitamin D deficiency, unspecified: Secondary | ICD-10-CM

## 2022-01-13 DIAGNOSIS — R109 Unspecified abdominal pain: Secondary | ICD-10-CM

## 2022-01-13 DIAGNOSIS — Z0001 Encounter for general adult medical examination with abnormal findings: Secondary | ICD-10-CM

## 2022-01-13 DIAGNOSIS — E1165 Type 2 diabetes mellitus with hyperglycemia: Secondary | ICD-10-CM

## 2022-01-13 DIAGNOSIS — E7800 Pure hypercholesterolemia, unspecified: Secondary | ICD-10-CM

## 2022-01-13 DIAGNOSIS — E538 Deficiency of other specified B group vitamins: Secondary | ICD-10-CM

## 2022-01-13 NOTE — Patient Instructions (Signed)

## 2022-01-13 NOTE — Progress Notes (Signed)
Patient ID: Trevor Ruane., male   DOB: [birth year], 46 y.o.   MRN: [id number] ? ? ? ?     Chief Complaint:: wellness exam and left side pain, low BP, dm ? ?     HPI:  Trevor Sarli. is a 46 y.o. male here for wellness exam; plans to call for eye exam soon himself; declines covid booster, flu shot, hep c screen and colonoscopy, o/w up to date ?         ?              Also c/o left sided abd pain, mild intermittent for more than 6 mo, seems to sometimes start from the left flank and wraps around, burning and occassinoaly dull, maybe worse to twist about at the waist, no rash or swelling; nothing else makes better or worse.  Pt denies chest pain, increased sob or doe, wheezing, orthopnea, PND, increased LE swelling, palpitations, dizziness or syncope.   Pt denies polydipsia, polyuria, or other new focal neuro s/s.   Pt denies fever, wt loss, night sweats, loss of appetite, or other constitutional symptoms  Denies worsening reflux, abd pain, dysphagia, n/v, bowel change or blood.  No low back pain or radicular symptoms ?Wt Readings from Last 3 Encounters:  ?01/13/22 225 lb (102.1 kg)  ?11/02/21 222 lb (100.7 kg)  ?10/19/21 226 lb (102.5 kg)  ? ?BP Readings from Last 3 Encounters:  ?01/13/22 90/68  ?12/30/21 124/82  ?11/02/21 130/70  ? ?Immunization History  ?Administered Date(s) Administered  ? Tdap 02/15/2017  ? ?Health Maintenance Due  ?Topic Date Due  ? HEMOGLOBIN A1C  12/10/2021  ? ?  ? ?Past Medical History:  ?Diagnosis Date  ? Prediabetes   ? ?History reviewed. No pertinent surgical history. ? reports that he has quit smoking. His smoking use included cigarettes. He has never used smokeless tobacco. He reports that he does not drink alcohol and does not use drugs. ?family history includes Cancer in his father; Diabetes in an other family member; Healthy in his mother. ?Allergies  ?Allergen Reactions  ? Metformin And Related Other (See Comments)  ?  Loose stools and bowel frequency  ? ?Current  Outpatient Medications on File Prior to Visit  ?Medication Sig Dispense Refill  ? Cholecalciferol 50 MCG (2000 UT) TABS 1 tab by mouth once daily 30 tablet 99  ? meloxicam (MOBIC) 15 MG tablet Take 1 tablet (15 mg total) by mouth daily. 90 tablet 1  ? rizatriptan (MAXALT-MLT) 10 MG disintegrating tablet Take 1 tablet (10 mg total) by mouth as needed for migraine. May repeat in 2 hours if needed 10 tablet 5  ? rosuvastatin (CRESTOR) 40 MG tablet Take 1 tablet (40 mg total) by mouth daily. 90 tablet 3  ? tiZANidine (ZANAFLEX) 4 MG tablet Take 1 tablet (4 mg total) by mouth at bedtime. 30 tablet 5  ? ?No current facility-administered medications on file prior to visit.  ? ?     ROS:  All others reviewed and negative. ? ?Objective  ? ?     PE:  BP 90/68 (BP Location: Left Arm, Patient Position: Sitting, Cuff Size: Large)   Pulse 91   Temp 98.4 ?F (36.9 ?C) (Oral)   Ht 5\' 8"  (1.727 m)   Wt 225 lb (102.1 kg)   SpO2 94%   BMI 34.21 kg/m?  ? ?              Constitutional: Pt appears in NAD ?  HENT: Head: NCAT.  ?              Right Ear: External ear normal.   ?              Left Ear: External ear normal.  ?              Eyes: . Pupils are equal, round, and reactive to light. Conjunctivae and EOM are normal ?              Nose: without d/c or deformity ?              Neck: Neck supple. Gross normal ROM ?              Cardiovascular: Normal rate and regular rhythm.   ?              Pulmonary/Chest: Effort normal and breath sounds without rales or wheezing.  ?              Abd:  Soft, NT, ND, + BS, no organomegaly - benign ?              Neurological: Pt is alert. At baseline orientation, motor grossly intact ?              Skin: Skin is warm. No rashes, no other new lesions, LE edema - none ?              Psychiatric: Pt behavior is normal without agitation  ? ?Micro: none ? ?Cardiac tracings I have personally interpreted today:  none ? ?Pertinent Radiological findings (summarize): none  ? ?Lab Results   ?Component Value Date  ? WBC 17.3 (H) 08/07/2021  ? HGB 13.2 08/07/2021  ? HCT 42.5 08/07/2021  ? PLT 346 08/07/2021  ? GLUCOSE 120 (H) 08/07/2021  ? CHOL 196 06/09/2021  ? TRIG 233.0 (H) 06/09/2021  ? HDL 40.10 06/09/2021  ? LDLDIRECT 122.0 06/09/2021  ? LDLCALC 135 (H) 02/15/2017  ? ALT 34 06/09/2021  ? AST 23 06/09/2021  ? NA 141 08/07/2021  ? K 3.9 08/07/2021  ? CL 105 08/07/2021  ? CREATININE 1.00 08/07/2021  ? BUN 22 (H) 08/07/2021  ? CO2 28 08/07/2021  ? TSH 1.54 06/09/2021  ? PSA 0.15 06/09/2021  ? HGBA1C 6.2 06/09/2021  ? MICROALBUR <0.7 06/09/2021  ? ?Assessment/Plan:  ?Trevor Monje. is a 46 y.o. Black or African American [2] male with  has a past medical history of Prediabetes. ? ?Vitamin D deficiency ?Last vitamin D ?Lab Results  ?Component Value Date  ? VD25OH 14.28 (L) 06/09/2021  ? ?Low, to start oral replacement ? ? ?Encounter for well adult exam with abnormal findings ?Age and sex appropriate education and counseling updated with regular exercise and diet ?Referrals for preventative services - due for hep c and colonoscopy screems ?Immunizations addressed - declines covid booster, flu shot ?Smoking counseling  - none needed ?Evidence for depression or other mood disorder - none significant ?Most recent labs reviewed. ?I have personally reviewed and have noted: ?1) the patient's medical and social history ?2) The patient's current medications and supplements ?3) The patient's height, weight, and BMI have been recorded in the chart ? ? ?HLD (hyperlipidemia) ?Lab Results  ?Component Value Date  ? LDLCALC 135 (H) 02/15/2017  ? ?Mild uncontrolled, goal ldl < 100, pt to continue crestor and f/u lab today ? ? ?Diabetes ([facility]) ?Lab Results  ?Component Value Date  ? HGBA1C 6.2 06/09/2021  ? ?  Stable, pt to continue current medical treatment  - diet ? ?Left sided abdominal pain ?Etiology unclear, atypical, for lipase with labs, I suspect msk vs neuritic, declines gabapentin trial or CT abd or MRI t  spine, for labs as ordered and  to f/u any worsening symptoms or concerns ? ?Followup: Return in about 6 months (around 07/16/2022). ? ?Oliver Barre, MD 01/16/2022 5:43 PM ?[facility] ?[facility] ?Internal Medicine ?

## 2022-01-13 NOTE — Assessment & Plan Note (Signed)
Last vitamin D ?Lab Results  ?Component Value Date  ? VD25OH 14.28 (L) 06/09/2021  ? ?Low, to start oral replacement ? ?

## 2022-01-14 LAB — TSH: TSH: 1.21 u[IU]/mL (ref 0.35–5.50)

## 2022-01-14 LAB — CBC WITH DIFFERENTIAL/PLATELET
Basophils Absolute: 0.1 10*3/uL (ref 0.0–0.1)
Basophils Relative: 0.9 % (ref 0.0–3.0)
Eosinophils Absolute: 0.1 10*3/uL (ref 0.0–0.7)
Eosinophils Relative: 1 % (ref 0.0–5.0)
HCT: 45.5 % (ref 39.0–52.0)
Hemoglobin: 13.9 g/dL (ref 13.0–17.0)
Lymphocytes Relative: 35.1 % (ref 12.0–46.0)
Lymphs Abs: 3.3 10*3/uL (ref 0.7–4.0)
MCHC: 30.6 g/dL (ref 30.0–36.0)
MCV: 71 fl — ABNORMAL LOW (ref 78.0–100.0)
Monocytes Absolute: 1.3 10*3/uL — ABNORMAL HIGH (ref 0.1–1.0)
Monocytes Relative: 13.8 % — ABNORMAL HIGH (ref 3.0–12.0)
Neutro Abs: 4.7 10*3/uL (ref 1.4–7.7)
Neutrophils Relative %: 49.2 % (ref 43.0–77.0)
Platelets: 339 10*3/uL (ref 150.0–400.0)
RBC: 6.26 Mil/uL — ABNORMAL HIGH (ref 4.22–5.81)
RDW: 17.2 % — ABNORMAL HIGH (ref 11.5–15.5)
WBC: 9.5 10*3/uL (ref 4.0–10.5)

## 2022-01-14 LAB — URINALYSIS, ROUTINE W REFLEX MICROSCOPIC
Bilirubin Urine: NEGATIVE
Hgb urine dipstick: NEGATIVE
Ketones, ur: NEGATIVE
Leukocytes,Ua: NEGATIVE
Nitrite: NEGATIVE
Specific Gravity, Urine: 1.02 (ref 1.000–1.030)
Total Protein, Urine: NEGATIVE
Urine Glucose: NEGATIVE
Urobilinogen, UA: 1 (ref 0.0–1.0)
pH: 6.5 (ref 5.0–8.0)

## 2022-01-14 LAB — LIPASE: Lipase: 31 U/L (ref 11.0–59.0)

## 2022-01-14 LAB — HEPATIC FUNCTION PANEL
ALT: 27 U/L (ref 0–53)
AST: 22 U/L (ref 0–37)
Albumin: 4 g/dL (ref 3.5–5.2)
Alkaline Phosphatase: 62 U/L (ref 39–117)
Bilirubin, Direct: 0.1 mg/dL (ref 0.0–0.3)
Total Bilirubin: 0.5 mg/dL (ref 0.2–1.2)
Total Protein: 6 g/dL (ref 6.0–8.3)

## 2022-01-14 LAB — LIPID PANEL
Cholesterol: 204 mg/dL — ABNORMAL HIGH (ref 0–200)
HDL: 45.5 mg/dL (ref >39.00)
NonHDL: 158.21
Total CHOL/HDL Ratio: 4
Triglycerides: 229 mg/dL — ABNORMAL HIGH (ref 0.0–149.0)
VLDL: 45.8 mg/dL — ABNORMAL HIGH (ref 0.0–40.0)

## 2022-01-14 LAB — BASIC METABOLIC PANEL
BUN: 21 mg/dL (ref 6–23)
CO2: 32 mEq/L (ref 19–32)
Calcium: 8.9 mg/dL (ref 8.4–10.5)
Chloride: 106 mEq/L (ref 96–112)
Creatinine, Ser: 0.82 mg/dL (ref 0.40–1.50)
GFR: 105.5 mL/min (ref >60.00)
Glucose, Bld: 89 mg/dL (ref 70–99)
Potassium: 4.1 mEq/L (ref 3.5–5.1)
Sodium: 142 mEq/L (ref 135–145)

## 2022-01-14 LAB — LDL CHOLESTEROL, DIRECT: Direct LDL: 129 mg/dL

## 2022-01-14 LAB — HEMOGLOBIN A1C: Hgb A1c MFr Bld: 6.6 % — ABNORMAL HIGH (ref 4.6–6.5)

## 2022-01-14 LAB — MICROALBUMIN / CREATININE URINE RATIO
Creatinine,U: 241.8 mg/dL
Microalb Creat Ratio: 0.4 mg/g (ref 0.0–30.0)
Microalb, Ur: 1.1 mg/dL (ref 0.0–1.9)

## 2022-01-14 LAB — VITAMIN D 25 HYDROXY (VIT D DEFICIENCY, FRACTURES): VITD: 9.83 ng/mL — ABNORMAL LOW (ref 30.00–100.00)

## 2022-01-14 LAB — PSA: PSA: 0.07 ng/mL — ABNORMAL LOW (ref 0.10–4.00)

## 2022-01-14 LAB — VITAMIN B12: Vitamin B-12: 258 pg/mL (ref 211–911)

## 2022-01-16 ENCOUNTER — Encounter: Payer: Self-pay | Admitting: Internal Medicine

## 2022-01-16 DIAGNOSIS — R109 Unspecified abdominal pain: Secondary | ICD-10-CM | POA: Insufficient documentation

## 2022-01-16 NOTE — Assessment & Plan Note (Signed)
Lab Results  ?Component Value Date  ? LDLCALC 135 (H) 02/15/2017  ? ?Mild uncontrolled, goal ldl < 100, pt to continue crestor and f/u lab today ? ?

## 2022-01-16 NOTE — Assessment & Plan Note (Signed)
Lab Results  Component Value Date   HGBA1C 6.2 06/09/2021   Stable, pt to continue current medical treatment  - diet 

## 2022-01-16 NOTE — Assessment & Plan Note (Signed)
Age and sex appropriate education and counseling updated with regular exercise and diet ?Referrals for preventative services - due for hep c and colonoscopy screems ?Immunizations addressed - declines covid booster, flu shot ?Smoking counseling  - none needed ?Evidence for depression or other mood disorder - none significant ?Most recent labs reviewed. ?I have personally reviewed and have noted: ?1) the patient's medical and social history ?2) The patient's current medications and supplements ?3) The patient's height, weight, and BMI have been recorded in the chart ? ?

## 2022-01-16 NOTE — Assessment & Plan Note (Signed)
Etiology unclear, atypical, for lipase with labs, I suspect msk vs neuritic, declines gabapentin trial or CT abd or MRI t spine, for labs as ordered and  to f/u any worsening symptoms or concerns ?

## 2022-01-25 ENCOUNTER — Ambulatory Visit: Payer: 59 | Attending: Sports Medicine | Admitting: Physical Therapy

## 2022-01-25 DIAGNOSIS — M542 Cervicalgia: Secondary | ICD-10-CM | POA: Insufficient documentation

## 2022-01-25 DIAGNOSIS — R293 Abnormal posture: Secondary | ICD-10-CM | POA: Insufficient documentation

## 2022-01-25 DIAGNOSIS — R2681 Unsteadiness on feet: Secondary | ICD-10-CM | POA: Insufficient documentation

## 2022-01-25 DIAGNOSIS — R42 Dizziness and giddiness: Secondary | ICD-10-CM | POA: Insufficient documentation

## 2022-01-28 ENCOUNTER — Other Ambulatory Visit: Payer: Self-pay

## 2022-01-28 ENCOUNTER — Ambulatory Visit: Payer: 59 | Admitting: Physical Therapy

## 2022-01-28 DIAGNOSIS — M542 Cervicalgia: Secondary | ICD-10-CM

## 2022-01-28 DIAGNOSIS — R2681 Unsteadiness on feet: Secondary | ICD-10-CM | POA: Diagnosis not present

## 2022-01-28 DIAGNOSIS — R42 Dizziness and giddiness: Secondary | ICD-10-CM | POA: Diagnosis not present

## 2022-01-28 DIAGNOSIS — R293 Abnormal posture: Secondary | ICD-10-CM | POA: Diagnosis not present
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
## 2022-02-02 ENCOUNTER — Other Ambulatory Visit: Payer: Self-pay

## 2022-02-02 ENCOUNTER — Ambulatory Visit: Payer: 59

## 2022-02-02 DIAGNOSIS — R293 Abnormal posture: Secondary | ICD-10-CM | POA: Diagnosis not present

## 2022-02-02 DIAGNOSIS — R42 Dizziness and giddiness: Secondary | ICD-10-CM

## 2022-02-02 DIAGNOSIS — M542 Cervicalgia: Secondary | ICD-10-CM

## 2022-02-02 DIAGNOSIS — R2681 Unsteadiness on feet: Secondary | ICD-10-CM | POA: Diagnosis not present
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
## 2022-02-07 ENCOUNTER — Encounter (HOSPITAL_COMMUNITY): Payer: Self-pay

## 2022-02-07 ENCOUNTER — Emergency Department (HOSPITAL_COMMUNITY): Payer: 59

## 2022-02-07 ENCOUNTER — Other Ambulatory Visit: Payer: Self-pay

## 2022-02-07 ENCOUNTER — Emergency Department (HOSPITAL_COMMUNITY)
Admission: EM | Admit: 2022-02-07 | Discharge: 2022-02-07 | Disposition: A | Discharge status: Home / Self Care | Payer: 59 | Attending: Emergency Medicine | Admitting: Emergency Medicine

## 2022-02-07 DIAGNOSIS — Z87891 Personal history of nicotine dependence: Secondary | ICD-10-CM | POA: Diagnosis not present

## 2022-02-07 DIAGNOSIS — R42 Dizziness and giddiness: Secondary | ICD-10-CM | POA: Diagnosis not present

## 2022-02-07 DIAGNOSIS — R079 Chest pain, unspecified: Secondary | ICD-10-CM | POA: Diagnosis not present

## 2022-02-07 DIAGNOSIS — R0789 Other chest pain: Secondary | ICD-10-CM | POA: Insufficient documentation

## 2022-02-07 LAB — BASIC METABOLIC PANEL
Anion gap: 5 (ref 5–15)
BUN: 11 mg/dL (ref 6–20)
CO2: 28 mmol/L (ref 22–32)
Calcium: 8.9 mg/dL (ref 8.9–10.3)
Chloride: 107 mmol/L (ref 98–111)
Creatinine, Ser: 1 mg/dL (ref 0.61–1.24)
GFR, Estimated: >60 mL/min (ref >60)
Glucose, Bld: 106 mg/dL — ABNORMAL HIGH (ref 70–99)
Potassium: 3.7 mmol/L (ref 3.5–5.1)
Sodium: 140 mmol/L (ref 135–145)

## 2022-02-07 LAB — CBC
HCT: 46.9 % (ref 39.0–52.0)
Hemoglobin: 14.3 g/dL (ref 13.0–17.0)
MCH: 21.8 pg — ABNORMAL LOW (ref 26.0–34.0)
MCHC: 30.5 g/dL (ref 30.0–36.0)
MCV: 71.4 fL — ABNORMAL LOW (ref 80.0–100.0)
Platelets: 330 10*3/uL (ref 150–400)
RBC: 6.57 MIL/uL — ABNORMAL HIGH (ref 4.22–5.81)
RDW: 18.2 % — ABNORMAL HIGH (ref 11.5–15.5)
WBC: 9.2 10*3/uL (ref 4.0–10.5)
nRBC: 0 % (ref 0.0–0.2)

## 2022-02-07 LAB — TROPONIN I (HIGH SENSITIVITY)
Troponin I (High Sensitivity): 3 ng/L (ref <18)
Troponin I (High Sensitivity): 4 ng/L (ref <18)

## 2022-02-07 MED ORDER — KETOROLAC TROMETHAMINE 60 MG/2ML IM SOLN
60.0000 mg | Freq: Once | INTRAMUSCULAR | Status: AC
  Administered 2022-02-07: 60 mg via INTRAMUSCULAR
  Filled 2022-02-07: qty 2

## 2022-02-07 NOTE — ED Triage Notes (Signed)
Pt arrives to ED POV c/o CP. States that its Upper Left chest, 10/10 pain that has been going on since Friday. Denies any Cardiac Hx. No distress noted at this time. ?

## 2022-02-07 NOTE — ED Notes (Signed)
Patient transported to X-ray 
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
## 2022-02-07 NOTE — ED Notes (Signed)
Reviewed discharge instructions with patient. Follow-up care and pain management reviewed. Patient verbalized understanding. Patient A&Ox4, VSS, and ambulatory with steady gait upon discharge. 

## 2022-02-07 NOTE — Discharge Instructions (Signed)
You were evaluated in the Emergency Department and after careful evaluation, we did not find any emergent condition requiring admission or further testing in the hospital. ? ?Your cardiac work-up today was overall reassuring.  There is no signs of a heart attack or any other life-threatening cause of your pain.  I suspect that your pain may be due to muscular soreness from the physical therapy that you have been doing.  However I do recommend that you follow-up with a cardiologist, I have provided their contact information in your discharge paperwork.  Please call the phone number and schedule an appointment.  He may take ibuprofen for pain.  Please make sure to return to the ER for worsening chest pain, shortness of breath, dizziness, passing out, or any other new or concerning symptoms. ? ?Thank you for allowing [place] to be a part of your care. ? ?

## 2022-02-09 ENCOUNTER — Other Ambulatory Visit: Payer: Self-pay

## 2022-02-09 ENCOUNTER — Encounter: Payer: Self-pay | Admitting: Physical Therapy

## 2022-02-09 ENCOUNTER — Ambulatory Visit: Payer: 59 | Admitting: Physical Therapy

## 2022-02-09 DIAGNOSIS — R42 Dizziness and giddiness: Secondary | ICD-10-CM

## 2022-02-09 DIAGNOSIS — R293 Abnormal posture: Secondary | ICD-10-CM | POA: Diagnosis not present

## 2022-02-09 DIAGNOSIS — M542 Cervicalgia: Secondary | ICD-10-CM | POA: Diagnosis not present

## 2022-02-09 DIAGNOSIS — R2681 Unsteadiness on feet: Secondary | ICD-10-CM | POA: Diagnosis not present

## 2022-02-09 NOTE — Therapy (Signed)
?OUTPATIENT PHYSICAL THERAPY VESTIBULAR TREATMENT NOTE ? ? ?Patient Name: Trevor Wallace. ?MRN: [id number] ?DOB:[birth year], 46 y.o., male ?Today's Date: 02/09/2022 ? ?PCP: Biagio Borg, MD ?REFERRING PROVIDER: Biagio Borg, MD ? ? PT End of Session - 02/09/22 0848   ? ? Visit Number 3   ? Number of Visits 5   ? Date for PT Re-Evaluation 03/11/22   ? Authorization Type Self Pay?   ? PT Start Time [phone]   ? PT Stop Time 0926   ? PT Time Calculation (min) 40 min   ? Activity Tolerance Patient tolerated treatment well   ? Behavior During Therapy [facility] for tasks assessed/performed   ? ?  ?  ? ?  ? ? ?Past Medical History:  ?Diagnosis Date  ? Prediabetes   ? ?History reviewed. No pertinent surgical history. ?Patient Active Problem List  ? Diagnosis Date Noted  ? Abdominal pain 01/16/2022  ? Left sided abdominal pain 01/16/2022  ? Right shoulder pain 10/15/2021  ? Vitamin D deficiency 08/13/2021  ? HLD (hyperlipidemia) 08/13/2021  ? Migraine 08/13/2021  ? Cervical myelopathy ([place]) 06/09/2021  ? Gait disorder 06/09/2021  ? RLS (restless legs syndrome) 06/09/2021  ? COVID-19 virus infection 10/27/2020  ? Urinary frequency 04/10/2020  ? Elevated LFTs 09/27/2019  ? Encounter for well adult exam with abnormal findings 02/15/2017  ? Former smoker 02/15/2017  ? Dysuria 02/15/2017  ? Diabetes [facility])   ? ? ?ONSET DATE: 01/12/2022 (referral) ? ?REFERRING DIAG: R26.9 (ICD-10-CM) - Gait disorder G95.9 (ICD-10-CM) - Cervical myelopathy (HCC)  ? ?THERAPY DIAG:  ?Dizziness and giddiness ? ?Cervicalgia ? ?PERTINENT HISTORY: c/o left sided abd pain, mild intermittent for more than 6 mo, seems to sometimes start from the left flank and wraps around, burning and occassinoaly dull, maybe worse to twist about at the waist, no rash or swelling; nothing else makes better or worse  ? ?PRECAUTIONS: Fall, on light duty at work (lifting 10 pounds or fewer)  ? ?SUBJECTIVE: He feels like the headaches are still constant and he is having pain and  stiffness in the neck. ? ?PAIN: ?Are you having pain? Yes: NPRS scale: 8/10 ?Pain location: Neck, Bilateral (L > R) ?Pain description: Hervey Ard, Aching ?Aggravating factors: movement ?Relieving factors: rest, hold neck still ? ?TODAY'S TREATMENT: ?PT initiates session with oscillatory rotation passively progressed to myofascial stretch with light manual cervical traction x3 20sec holds.  Initiated cervical segmental mobility assessment of cervical retraction over therapists hand.  Assessed cervical paraspinal musculature with inc sensitivity noted at base of occiput at upper trap origin and L suboccipital region > R.  Cervical mobility assessed w/ grade 3/4 CPA/UPA to address extension and rotation.  Pt reports tenderness with L UPA generally into R rotation.  Assessed trap in sitting, no trigger points identified, performed brief manual trap stretch prior to initiation of HEP.  See HEP section below. ?OBJECTIVE:  ? Neck Disability Index (NDI): 26/50 (52%) ? ?Vestibular Asssessment ?  ? Symptom Behavior: ?  Subjective history: See Subjective (Concussion) ?  Non-Vestibular symptoms: neck pain, headaches, and blurred vision ?  Type of dizziness: Lightheadedness/Faint ?  Frequency: Daily ?  Duration: Constant ?  Aggravating factors: Induced by position change: supine to sit and sit to stand and Moving eyes ?  Relieving factors: closing eyes and rest ?  Progression of symptoms: worse ? ? Oculomotor Exam: ?  Ocular Alignment: normal ?  Ocular ROM: No Limitations ?  Spontaneous Nystagmus: absent ?  Gaze-Induced Nystagmus: absent ?  Smooth Pursuits: intact; reports mild sensation in head "Undescribable"  ?  Saccades: intact; mild dizziness ?  Convergence/Divergence: 15 cm  ? ? ? Vestibular-Ocular Reflex (VOR): ?  Slow VOR: Normal ?  VOR Cancellation: Normal ?  Head-Impulse Test: HIT Right: negative ?HIT Left: negative; Denies dizziness, pain in the side of head (at the temple)  ?  ? Positional Testing: Right Dix-Hallpike:  none; Duration:0 ?Left Dix-Hallpike: none; Duration: 0 ?Right Roll Test: none; Duration: 0 ?Left Roll Test: none; Duration: 0 ?  ? ?Motion Sensitivity:  ? ?Motion Sensitivity Quotient ? ?Intensity: 0 = none, 1 = Lightheaded, 2 = Mild, 3 = Moderate, 4 = Severe, 5 = Vomiting ? ? Intensity  ?1. Sitting to supine 1 (increased pain in head)  ?2. Supine to L side 0  ?3. Supine to R side 0 (increased pain on the R side, reports HA/throbbing  ?4. Supine to sitting 1  ?5. L Hallpike-Dix 0  ?6. Up from L  1  ?7. R Hallpike-Dix 1  ?8. Up from R  1  ?9. Sitting, head  ?tipped to L knee 0 (reports pressure in head), throbbing sensation  ?10. Head up from L  ?knee 1  ?11. Sitting, head  ?tipped to R knee 0 (reports pressure in head)  ?12. Head up from R  ?knee 1   ?13. Sitting head turns x5 0 (pain with rotation to L side)  ?14.Sitting head nods x5 1 (Pain in posterior neck and R side)   ?15. In stance, 180?  ?turn to L  0  ?16. In stance, 180?  ?turn to R 0  ? ? ? ? ?HOME EXERCISE PROGRAM: ?Access Code: [email or web address] ?URL: [email or web address] ?Date: 02/09/2022 ?Prepared by: Elease Etienne ? ?Exercises ?- Supine Chin Tuck  - 1 x daily - 4 x weekly - 3 sets - 10 reps ?- Seated Upper Trapezius Stretch  - 1 x daily - 4 x weekly - 3 sets - 10 reps ?- Seated Cervical Rotation AROM  - 1 x daily - 4 x weekly - 3 sets - 10 reps ?- Supine Cervical Retraction with Towel  - 1 x daily - 4 x weekly - 2 sets - 10 reps ?- Cervical Extension AROM with Strap  - 1 x daily - 4 x weekly - 2 sets - 10 reps ?- Seated Levator Scapulae Stretch  - 1 x daily - 4 x weekly - 3 sets - 10 reps ?  ?  ?  ?GOALS: ?Goals reviewed with patient? Yes ?  ?SHORT TERM GOALS= LONG TERM GOALS DUE TO POC LENGTH: Target date: 02/25/2022 ?  ?Pt will be independent with HEP for improved strength, balance, ROM and pain modulation.  ?  ?Baseline:  ?Goal status: INITIAL ?  ?2.  Pt will improve FGA to >/= 29/30 to demonstrate improved balance and reduced fall  risk ? ?Baseline: 27/30 ?Goal status: REVISED ?  ?3.  Pt will improve NDI to </= 30% to show improved tolerance for activities  ?Baseline: 52% ?Goal status: MET ?  ?4.  Pt will improve bilat cervical lateral flexion ROM to at least 20 degrees without pain for improved function  ?Baseline: 12 degrees R, 10 degrees L  ?Goal status: INITIAL ?  ?5.  Pt will improve bilat cervical rotation ROM by at least 15 degrees without pain for improved function  ?Baseline: 35 degrees R, 25 degrees L  ?Goal status: INITIAL ?  ?ASSESSMENT: ?  ?CLINICAL IMPRESSION: ?Focused skilled session on manual  therapy to cervical spine and paraspinal musculature with pt response primarily in L suboccipital and trap regions as well as stiffness in cervical spine into right rotation.  Pt responds well to mobilization with movement techniques and supine and seated cervical therex to progress ROM.  Continue per POC.  ?  ?  ?  ?OBJECTIVE IMPAIRMENTS decreased activity tolerance, decreased endurance, decreased knowledge of condition, decreased mobility, dizziness, impaired sensation, and pain.  ?  ?ACTIVITY LIMITATIONS community activity and occupation.  ?  ?PERSONAL FACTORS Fitness, Past/current experiences, Time since onset of injury/illness/exacerbation, and 1 comorbidity: prediabetes  are also affecting patient's functional outcome.  ?  ?  ?REHAB POTENTIAL: Good ?  ?CLINICAL DECISION MAKING: Stable/uncomplicated ?  ?EVALUATION COMPLEXITY: Low ?  ?PLAN: ?PT FREQUENCY: 1x/week ?  ?PT DURATION: 6 weeks ?  ?PLANNED INTERVENTIONS: Therapeutic exercises, Therapeutic activity, Neuromuscular re-education, Balance training, Gait training, Patient/Family education, Joint mobilization, Stair training, Vestibular training, Visual/preceptual remediation/compensation, Dry Needling, and Manual therapy ?  ?PLAN FOR NEXT SESSION: Modify HEP prn for Neck ROM, incorporate positions of intolerance and motion sensitivity. Manual Therapy to Neck into R rotation,  suboccipital and bilateral upper trap origin. ? ? ?Bary Richard, PT, DPT ?02/09/2022, 5:22 PM ? ? ?   ? ?

## 2022-02-09 NOTE — Patient Instructions (Signed)
Access Code: [email or web address] ?URL: [email or web address] ?Date: 02/09/2022 ?Prepared by: Camille Bal ? ?Exercises ?- Supine Chin Tuck  - 1 x daily - 4 x weekly - 3 sets - 10 reps ?- Seated Upper Trapezius Stretch  - 1 x daily - 4 x weekly - 3 sets - 10 reps ?- Seated Cervical Rotation AROM  - 1 x daily - 4 x weekly - 3 sets - 10 reps ?- Supine Cervical Retraction with Towel  - 1 x daily - 4 x weekly - 2 sets - 10 reps ?- Cervical Extension AROM with Strap  - 1 x daily - 4 x weekly - 2 sets - 10 reps ?- Seated Levator Scapulae Stretch  - 1 x daily - 4 x weekly - 3 sets - 10 reps ?

## 2022-02-16 ENCOUNTER — Ambulatory Visit: Payer: 59 | Attending: Sports Medicine

## 2022-02-16 VITALS — BP 115/70 | HR 72

## 2022-02-16 DIAGNOSIS — M542 Cervicalgia: Secondary | ICD-10-CM | POA: Diagnosis not present

## 2022-02-16 DIAGNOSIS — R42 Dizziness and giddiness: Secondary | ICD-10-CM | POA: Diagnosis not present

## 2022-02-16 NOTE — Therapy (Signed)
?OUTPATIENT PHYSICAL THERAPY VESTIBULAR TREATMENT NOTE ? ? ?Patient Name: Trevor Wallace. ?MRN: [id number] ?DOB:03-12-1976, 46 y.o., male ?Today's Date: 02/16/2022 ? ?PCP: Biagio Borg, MD ?REFERRING PROVIDER: Biagio Borg, MD ? ? PT End of Session - 02/16/22 0846   ? ? Visit Number 4   ? Number of Visits 5   ? Date for PT Re-Evaluation 03/11/22   ? Authorization Type Self Pay?   ? PT Start Time [phone]   ? PT Stop Time [phone]   ? PT Time Calculation (min) 42 min   ? Activity Tolerance Patient tolerated treatment well   ? Behavior During Therapy [facility] for tasks assessed/performed   ? ?  ?  ? ?  ? ? ?Past Medical History:  ?Diagnosis Date  ? Prediabetes   ? ?History reviewed. No pertinent surgical history. ?Patient Active Problem List  ? Diagnosis Date Noted  ? Abdominal pain 01/16/2022  ? Left sided abdominal pain 01/16/2022  ? Right shoulder pain 10/15/2021  ? Vitamin D deficiency 08/13/2021  ? HLD (hyperlipidemia) 08/13/2021  ? Migraine 08/13/2021  ? Cervical myelopathy ([place]) 06/09/2021  ? Gait disorder 06/09/2021  ? RLS (restless legs syndrome) 06/09/2021  ? COVID-19 virus infection 10/27/2020  ? Urinary frequency 04/10/2020  ? Elevated LFTs 09/27/2019  ? Encounter for well adult exam with abnormal findings 02/15/2017  ? Former smoker 02/15/2017  ? Dysuria 02/15/2017  ? Diabetes [facility])   ? ? ?ONSET DATE: 01/12/2022 (referral) ? ?REFERRING DIAG: R26.9 (ICD-10-CM) - Gait disorder G95.9 (ICD-10-CM) - Cervical myelopathy (HCC)  ? ?THERAPY DIAG:  ?Dizziness and giddiness ? ?Cervicalgia ? ?PERTINENT HISTORY: c/o left sided abd pain, mild intermittent for more than 6 mo, seems to sometimes start from the left flank and wraps around, burning and occassinoaly dull, maybe worse to twist about at the waist, no rash or swelling; nothing else makes better or worse  ? ?PRECAUTIONS: Fall, on light duty at work (lifting 10 pounds or fewer)  ? ?SUBJECTIVE: No other new changes/complaints since last visit. Reports  exercises/stretches are going well. Patient reports HA that has lasted a few days.  ? ?PAIN: ?Are you having pain? Yes: NPRS scale: 7/10 ?Pain location: Neck, R > L ?Pain description: Hervey Ard, Aching ?Aggravating factors: movement ?Relieving factors: rest, hold neck still ?  ?Today's Vitals  ? 02/16/22 0855  ?BP: 115/70  ?Pulse: 72  ? ?There is no height or weight on file to calculate BMI. ? ?OBJECTIVE:  ? ?Manual Therapy:  ? ?Prone: ?PT completed central and unilateral Grade II - III mobilizations from C5-T4,decreased mobility noted from C5-T2 with mild discomfort noted. ? ? ?Supine:  ?STM to B Upper Trap, B Rhomboids, and B Levator Scap with only 1-2 trigger points noted in Levator, PT providing prolonged pressure to area.  ?Manual Stretch to B Upper Trap progressing into range as tolerated, 3 x 30 seconds on L. Only able to tolerate 30 seconds on R due to pain. Added in muscle energy technique with shoulder depression with 5 second hold.  ?Completed manual cervical traction, 3 x 10-15 seconds, with reports of improvements in tension.  ?Suboccipital Release Bilaterally, 3 x 30 seconds, mild increase in HA noted with pressure. PT educating on how to properly use a tennis ball to provide self suboccipital release at home.  ? ? ?TherEx:  ?Completed seated scapular retraction with half booster behind patient, completed x 15 reps, with cues for improved technique and avoidance of over activation of upper trap. ? ?Verbal Review  of HEP provided at last session. Patient had not questions concerns at this time. ? ?Exercises ?- Supine Chin Tuck  - 1 x daily - 4 x weekly - 3 sets - 10 reps ?- Seated Upper Trapezius Stretch  - 1 x daily - 4 x weekly - 3 sets - 10 reps ?- Seated Cervical Rotation AROM  - 1 x daily - 4 x weekly - 3 sets - 10 reps ?- Supine Cervical Retraction with Towel  - 1 x daily - 4 x weekly - 2 sets - 10 reps ?- Cervical Extension AROM with Strap  - 1 x daily - 4 x weekly - 2 sets - 10 reps ?- Seated Levator  Scapulae Stretch  - 1 x daily - 4 x weekly - 3 sets - 10 reps ?  ? ? ? ? ? ?HOME EXERCISE PROGRAM: ?Access Code: [email or web address] ?  ?  ?  ?GOALS: ?Goals reviewed with patient? Yes ?  ?SHORT TERM GOALS= LONG TERM GOALS DUE TO POC LENGTH: Target date: 02/25/2022 ?  ?Pt will be independent with HEP for improved strength, balance, ROM and pain modulation.  ?  ?Baseline:  ?Goal status: INITIAL ?  ?2.  Pt will improve FGA to >/= 29/30 to demonstrate improved balance and reduced fall risk ? ?Baseline: 27/30 ?Goal status: REVISED ?  ?3.  Pt will improve NDI to </= 30% to show improved tolerance for activities  ?Baseline: 52% ?Goal status: MET ?  ?4.  Pt will improve bilat cervical lateral flexion ROM to at least 20 degrees without pain for improved function  ?Baseline: 12 degrees R, 10 degrees L  ?Goal status: INITIAL ?  ?5.  Pt will improve bilat cervical rotation ROM by at least 15 degrees without pain for improved function  ?Baseline: 35 degrees R, 25 degrees L  ?Goal status: INITIAL ?  ?ASSESSMENT: ?  ?CLINICAL IMPRESSION: ?Today's skilled PT session focused on continued manual therapy and therex to promote improved ROM of cervical cervical/thoracic. Mild HA noted with suboccipital release but patient reporting improved pain at end of session. Patient tolerating all treatment well. Will continue per POC.  ?  ?  ?  ?OBJECTIVE IMPAIRMENTS decreased activity tolerance, decreased endurance, decreased knowledge of condition, decreased mobility, dizziness, impaired sensation, and pain.  ?  ?ACTIVITY LIMITATIONS community activity and occupation.  ?  ?PERSONAL FACTORS Fitness, Past/current experiences, Time since onset of injury/illness/exacerbation, and 1 comorbidity: prediabetes  are also affecting patient's functional outcome.  ?  ?  ?REHAB POTENTIAL: Good ?  ?CLINICAL DECISION MAKING: Stable/uncomplicated ?  ?EVALUATION COMPLEXITY: Low ?  ?PLAN: ?PT FREQUENCY: 1x/week ?  ?PT DURATION: 6 weeks ?  ?PLANNED INTERVENTIONS: Therapeutic  exercises, Therapeutic activity, Neuromuscular re-education, Balance training, Gait training, Patient/Family education, Joint mobilization, Stair training, Vestibular training, Visual/preceptual remediation/compensation, Dry Needling, and Manual therapy ?  ?PLAN FOR NEXT SESSION: Modify HEP prn for Neck ROM, incorporate positions of intolerance and motion sensitivity. Manual Therapy to Neck into R rotation, suboccipital and bilateral upper trap origin. Will need re-cert next visit. Mentioned potential to get new referral for low back and be transferred to [facility] to have DN to cervical region and low back treated.  ? ? ?Jones Bales, PT, DPT ?02/16/2022, 9:33 AM ? ? ?   ? ?

## 2022-02-21 ENCOUNTER — Ambulatory Visit: Payer: 59 | Admitting: Physical Therapy

## 2022-02-23 ENCOUNTER — Encounter (HOSPITAL_COMMUNITY): Payer: Self-pay

## 2022-02-23 ENCOUNTER — Ambulatory Visit (HOSPITAL_COMMUNITY)
Admission: EM | Admit: 2022-02-23 | Discharge: 2022-02-23 | Disposition: A | Discharge status: Home / Self Care | Payer: 59 | Attending: Nurse Practitioner | Admitting: Nurse Practitioner

## 2022-02-23 DIAGNOSIS — R519 Headache, unspecified: Secondary | ICD-10-CM | POA: Diagnosis not present

## 2022-02-23 MED ORDER — KETOROLAC TROMETHAMINE 30 MG/ML IJ SOLN
INTRAMUSCULAR | Status: AC
  Filled 2022-02-23: qty 1

## 2022-02-23 MED ORDER — KETOROLAC TROMETHAMINE 30 MG/ML IJ SOLN
30.0000 mg | Freq: Once | INTRAMUSCULAR | Status: AC
  Administered 2022-02-23: 30 mg via INTRAMUSCULAR

## 2022-02-23 MED ORDER — RIZATRIPTAN BENZOATE 10 MG PO TBDP: 10.0000 mg | ORAL_TABLET | ORAL | 0 refills | Status: DC | PRN

## 2022-02-23 NOTE — Discharge Instructions (Addendum)
-   We have given you a shot of Toradol 30 mg today to help with the headache  ?-You can use Tylenol the rest of the day to help keep the headache down ?-I have sent a refill of the rizatriptan to the pharmacy-you can take 1 tablet and then repeat in 2 hours once if headache is not better ?-Please follow-up with your primary care provider if your headache does not improve ?-If the headache worsens, please go to the emergency room ?

## 2022-02-23 NOTE — ED Triage Notes (Signed)
Pt reports a hx of migraines. In the last few days he notes HA have increased in frequency and intensity. No meds taken. No emesis. ?
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
## 2022-03-02 ENCOUNTER — Ambulatory Visit: Payer: Self-pay

## 2022-03-05 ENCOUNTER — Ambulatory Visit: Payer: 59 | Admitting: Physical Therapy

## 2022-03-05 ENCOUNTER — Encounter: Payer: Self-pay | Admitting: Physical Therapy

## 2022-03-05 ENCOUNTER — Other Ambulatory Visit: Payer: Self-pay

## 2022-03-05 DIAGNOSIS — R42 Dizziness and giddiness: Secondary | ICD-10-CM | POA: Diagnosis not present

## 2022-03-05 DIAGNOSIS — M542 Cervicalgia: Secondary | ICD-10-CM | POA: Diagnosis not present

## 2022-03-05 NOTE — Patient Instructions (Signed)
Access Code: [email or web address] ?URL: [email or web address] ?Date: 03/05/2022 ?Prepared by: Rosana Hoes ? ?Exercises ?- Seated Cervical Sidebending Stretch  - 1 x daily - 3 reps - 20 seconds hold ?- Seated Levator Scapulae Stretch  - 1 x daily - 3 reps - 30 seconds hold ?- Seated Passive Cervical Retraction  - 1 x daily - 3 sets - 10 reps - 5 seconds hold ?- Banded Row  - 1 x daily - 3 sets - 15 reps ?- Shoulder External Rotation and Scapular Retraction with Resistance  - 1 x daily - 3 sets - 15 reps ?

## 2022-03-05 NOTE — Therapy (Signed)
?OUTPATIENT PHYSICAL THERAPY TREATMENT NOTE ? ? ?Patient Name: Trevor Wallace. ?MRN: [id number] ?DOB:1976/02/13, 46 y.o., male ?Today's Date: 03/05/2022 ? ?PCP: Biagio Borg, MD ?REFERRING PROVIDER: Biagio Borg, MD ? ? PT End of Session - 03/05/22 1032   ? ? Visit Number 5   ? Number of Visits 11   ? Date for PT Re-Evaluation 04/16/22   ? Authorization Type AETNA   ? PT Start Time 1030   ? PT Stop Time 1115   ? PT Time Calculation (min) 45 min   ? Activity Tolerance Patient tolerated treatment well   ? Behavior During Therapy [facility] for tasks assessed/performed   ? ?  ?  ? ?  ? ? ? ?Past Medical History:  ?Diagnosis Date  ? Prediabetes   ? ?History reviewed. No pertinent surgical history. ?Patient Active Problem List  ? Diagnosis Date Noted  ? Abdominal pain 01/16/2022  ? Left sided abdominal pain 01/16/2022  ? Right shoulder pain 10/15/2021  ? Vitamin D deficiency 08/13/2021  ? HLD (hyperlipidemia) 08/13/2021  ? Migraine 08/13/2021  ? Cervical myelopathy ([place]) 06/09/2021  ? Gait disorder 06/09/2021  ? RLS (restless legs syndrome) 06/09/2021  ? COVID-19 virus infection 10/27/2020  ? Urinary frequency 04/10/2020  ? Elevated LFTs 09/27/2019  ? Encounter for well adult exam with abnormal findings 02/15/2017  ? Former smoker 02/15/2017  ? Dysuria 02/15/2017  ? Diabetes [facility])   ? ? ?ONSET DATE: 01/12/2022 (referral) ? ?REFERRING DIAG: Gait disorder, Cervical myelopathy ? ?THERAPY DIAG:  ?Cervicalgia ? ?PERTINENT HISTORY: None ? ?PRECAUTIONS: On light duty at work (lifting 10 pounds or fewer)  ? ?SUBJECTIVE: Patient reports he still has pain in his neck and last night he felt some tightness in his right shoulder. He continues to have headaches ? ?PAIN: ?Are you having pain? Yes:  ?NPRS scale: 5/10 ?Pain location: Neck, R > L ?Pain description: Sharp, aching, tight ?Aggravating factors: Movement ?Relieving factors: Rest, hold neck still ?  ?There were no vitals filed for this visit. ? ?There is no height or weight on  file to calculate BMI. ? ? ?OBJECTIVE:  ?POSTURE:  ?Rounded shoulders, forward head - 03/05/2022 ? ?Cervical AROM/PROM: ?  ?A/PROM A/PROM (deg) ?01/28/2022  ? ?03/05/2022  ?Flexion 30 50  ?Extension 20 40  ?Right lateral flexion 12 25  ?Left lateral flexion 10 30  ?Right rotation 35 65  ?Left rotation 25 60  ?(Blank rows = not tested)  ? ?Neck Disability Index (NDI): 22 /50 (44%) - 03/05/2022 (26/50 - 02/02/2022) ? ? ?TODAY'S TREATMENT ?[facility] Adult PT Treatment:                                                DATE: 03/05/2022 ?Therapeutic Exercise: ?Upper trap and levator scap stretch 2 x 30 sec each ?Seated chin tuck with finger PROM 2 x 10 with 5 sec hold ?Machine row 45# 3 x 10 ?Row with blue x 15 ?Double ER and scap retraction with blue 3 x 15 ?Manual Therapy: ?Skilled palpation and monitoring of muscle tension while performing TPDN treatment ?Suboccipital release with gentle manual traction ?STM bilateral upper trap regions ?Trigger Point Dry Needling Treatment: ?Pre-treatment instruction: Patient instructed on dry needling rationale, procedures, and possible side effects including pain during treatment (achy,cramping feeling), bruising, drop of blood, lightheadedness, nausea, sweating. ?Patient Consent Given: Yes ?Education handout  provided: Previously provided ?Muscles treated: Bilateral upper traps, right suboccipitals, right levator scap, right infraspinatus  ?Needle size and number: .30x36m x 6 ?Electrical stimulation performed: No ?Parameters: N/A ?Treatment response/outcome: Twitch response elicited, Palpable decrease in muscle tension, and patient report of improved symptoms ?Post-treatment instructions: Patient instructed to expect possible mild to moderate muscle soreness later today and/or tomorrow. Patient instructed in methods to reduce muscle soreness and to continue prescribed HEP. If patient was dry needled over the lung field, patient was instructed on signs and symptoms of pneumothorax and, however  unlikely, to see immediate medical attention should they occur. Patient was also educated on signs and symptoms of infection and to seek medical attention should they occur. Patient verbalized understanding of these instructions and education. ? ? ?PATIENT EDUCATION:  ?Education details: POC extension, HEP update ?Person educated: Patient ?Education method: Explanation, Demonstration, Tactile cues, Verbal cues, and Handouts ?Education comprehension: verbalized understanding, returned demonstration, verbal cues required, and tactile cues required ? ?HOME EXERCISE PROGRAM: ?Access Code: [email or web address]?  ?  ? ASSESSMENT: ? CLINICAL IMPRESSION: ?Patient tolerated therapy well with no adverse effects. He demonstrates improved cervical motion and overall function on NDI this visit. Overall he continues to demonstrate limitations in cervical motion and report of tightness and headaches with poor posture. Therapy focused on reducing neck tightness with dry needling, manual, and stretching, and progressing his postural strength and control. He does require cueing for proper posture with exercises. Updated HEP this visit with good tolerance. Patient would benefit from continued skilled PT to progress his mobility and strength in order to reduce pain and maximize functional ability, so will extend PT POC for 6 more weeks at frequency of 1x/week. ?  ?  ?OBJECTIVE IMPAIRMENTS decreased activity tolerance, decreased endurance, decreased knowledge of condition, decreased mobility, dizziness, impaired sensation, and pain.  ?  ?ACTIVITY LIMITATIONS community activity and occupation.  ?  ?PERSONAL FACTORS Fitness, Past/current experiences, Time since onset of injury/illness/exacerbation, and 1 comorbidity: prediabetes  are also affecting patient's functional outcome.  ?  ? ?GOALS: ?Goals reviewed with patient? Yes ?  ?SHORT TERM GOALS= LONG TERM GOALS DUE TO POC LENGTH: Target date: 04/16/2022 ?  ?Pt will be independent with HEP for  improved strength, balance, ROM and pain modulation.  ?Baseline:  ?03/05/2022: progressing HEP ?Goal status: ONGOING ?  ?2.  Pt will improve FGA to >/= 29/30 to demonstrate improved balance and reduced fall risk ?Baseline: 27/30 ?Goal status: DEFERRED ?  ?3.  Pt will improve NDI to </= 30% to show improved tolerance for activities  ?Baseline: 52% ?03/05/2022: 22 /50 (44%) ?Goal status: PARTIALLY MET ?  ?4.  Pt will improve bilat cervical lateral flexion ROM >/= 30 degrees without pain for improved function  ?Baseline: 12 degrees R, 10 degrees L  ?03/05/2022: 25 degrees R, 30 degrees L  ?Goal status: MODIFIED ?  ?5.  Pt will improve bilat cervical rotation ROM >/= 70 deg without pain for improved function  ?Baseline: 35 degrees R, 25 degrees L  ?03/05/2022: 65 degrees R, 60 degrees L  ?Goal status: MODIFIED ?  ? ?PLAN: ?PT FREQUENCY: 1x/week ?  ?PT DURATION: 6 weeks ?  ?PLANNED INTERVENTIONS: Therapeutic exercises, Therapeutic activity, Neuromuscular re-education, Balance training, Gait training, Patient/Family education, Joint mobilization, Stair training, Vestibular training, Visual/preceptual remediation/compensation, Dry Needling, and Manual therapy ?  ?PLAN FOR NEXT SESSION: Review HEP and progress PRN, continued manual/dry needling for neck, neck stretching, progress DNF and postural/shoulder strengthening ? ? ?CHilda Blades PT, DPT, LAT,  ATC ?03/05/22  12:04 PM ?Phone: [phone] ?Fax: [phone] ? ? ? ?   ? ?

## 2022-03-08 NOTE — Therapy (Signed)
?OUTPATIENT PHYSICAL THERAPY TREATMENT NOTE ? ? ?Patient Name: Trevor RightWilliam Vittitow Jr. ?MRN: [id number] ?DOB:1976-03-31, 46 y.o., male ?Today's Date: 03/11/2022 ? ?PCP: Corwin LevinsJohn, James W, MD ?REFERRING PROVIDER: Corwin LevinsJohn, James W, MD ? ? PT End of Session - 03/11/22 0947   ? ? Visit Number 6   ? Number of Visits 11   ? Date for PT Re-Evaluation 04/16/22   ? Authorization Type AETNA   ? Progress Note Due on Visit 10   ? PT Start Time [id number]   ? PT Stop Time 1040   ? PT Time Calculation (min) 45 min   ? Activity Tolerance Patient tolerated treatment well   ? Behavior [facility] for tasks assessed/performed   ? ?  ?  ? ?  ? ? ? ? ?Past Medical History:  ?Diagnosis Date  ? Prediabetes   ? ?History reviewed. No pertinent surgical history. ?Patient Active Problem List  ? Diagnosis Date Noted  ? Abdominal pain 01/16/2022  ? Left sided abdominal pain 01/16/2022  ? Right shoulder pain 10/15/2021  ? Vitamin D deficiency 08/13/2021  ? HLD (hyperlipidemia) 08/13/2021  ? Migraine 08/13/2021  ? Cervical myelopathy ([facility]) 06/09/2021  ? Gait disorder 06/09/2021  ? RLS (restless legs syndrome) 06/09/2021  ? COVID-19 virus infection 10/27/2020  ? Urinary frequency 04/10/2020  ? Elevated LFTs 09/27/2019  ? Encounter for well adult exam with abnormal findings 02/15/2017  ? Former smoker 02/15/2017  ? Dysuria 02/15/2017  ? Diabetes [facility])   ? ? ?ONSET DATE: 01/12/2022 (referral) ? ?REFERRING DIAG: Gait disorder, Cervical myelopathy ? ?THERAPY DIAG:  ?Cervicalgia ? ?PERTINENT HISTORY: None ? ?PRECAUTIONS: On light duty at work (lifting 10 pounds or fewer)  ? ?SUBJECTIVE: Patient reports he is doing alright today. He is having much pain today. He still gets tightness with rotating his neck, more on the left today. And he states a headache. ? ?PAIN: ?Are you having pain? Yes:  ?NPRS scale: 2/10 ?Pain location: Neck, left ?Pain description: Sharp, aching, tight ?Aggravating factors: Movement, rotation ?Relieving factors: Rest, hold neck still ?   ?There were no vitals filed for this visit. ? ?There is no height or weight on file to calculate BMI. ? ? ?OBJECTIVE:  ?POSTURE:  ?Rounded shoulders, forward head - 03/05/2022 ? ?Cervical AROM/PROM: ?  ?A/PROM A/PROM (deg) ?01/28/2022  ? ?03/05/2022  ?Flexion 30 50  ?Extension 20 40  ?Right lateral flexion 12 25  ?Left lateral flexion 10 30  ?Right rotation 35 65  ?Left rotation 25 60  ?(Blank rows = not tested)  ? ?Neck Disability Index (NDI): 22 /50 (44%) - 03/05/2022 (26/50 - 02/02/2022) ? ? ?TODAY'S TREATMENT ?[facility]                                                DATE: 03/11/2022 ?Therapeutic Exercise: ?Upper trap and levator scap stretch 2 x 15 sec each ?Sidelying thoracic rotation x 10 each ?Seated chin tuck with finger PROM 2 x 10 with 5 sec hold ?Machine low row 45# 2 x 12 ?Machine high row 35# 2 x 12 ?Machine lat pull down 35# 2 x 12 ?Supine horizontal abduction with blue 2 x 15 ?Supine serratus punch 15# 2 x 10 ?Double ER and scap retraction with blue 2 x 15 ?Manual Therapy: ?Skilled palpation and monitoring of muscle tension while performing TPDN treatment ?Suboccipital  release with gentle manual traction ?Trigger Point Dry Needling Treatment: ?Pre-treatment instruction: Patient instructed on dry needling rationale, procedures, and possible side effects including pain during treatment (achy,cramping feeling), bruising, drop of blood, lightheadedness, nausea, sweating. ?Patient Consent Given: Yes ?Education handout provided: Previously provided ?Muscles treated: Bilateral upper traps, bilateral suboccipitals, right infraspinatus and posterior deltoid ?Needle size and number: .30x50mm x 8 ?Electrical stimulation performed: No ?Parameters: N/A ?Treatment response/outcome: Twitch response elicited, Palpable decrease in muscle tension, and patient report of improved symptoms ?Post-treatment instructions: Patient instructed to expect possible mild to moderate muscle soreness later today and/or  tomorrow. Patient instructed in methods to reduce muscle soreness and to continue prescribed HEP. If patient was dry needled over the lung field, patient was instructed on signs and symptoms of pneumothorax and, however unlikely, to see immediate medical attention should they occur. Patient was also educated on signs and symptoms of infection and to seek medical attention should they occur. Patient verbalized understanding of these instructions and education. ? ? ?[facility] Adult PT Treatment:                                                DATE: 03/05/2022 ?Therapeutic Exercise: ?Upper trap and levator scap stretch 2 x 30 sec each ?Seated chin tuck with finger PROM 2 x 10 with 5 sec hold ?Machine row 45# 3 x 10 ?Row with blue x 15 ?Double ER and scap retraction with blue 3 x 15 ?Manual Therapy: ?Skilled palpation and monitoring of muscle tension while performing TPDN treatment ?Suboccipital release with gentle manual traction ?STM bilateral upper trap regions ?Trigger Point Dry Needling Treatment: ?Pre-treatment instruction: Patient instructed on dry needling rationale, procedures, and possible side effects including pain during treatment (achy,cramping feeling), bruising, drop of blood, lightheadedness, nausea, sweating. ?Patient Consent Given: Yes ?Education handout provided: Previously provided ?Muscles treated: Bilateral upper traps, right suboccipitals, right levator scap, right infraspinatus  ?Needle size and number: .30x69mm x 6 ?Electrical stimulation performed: No ?Parameters: N/A ?Treatment response/outcome: Twitch response elicited, Palpable decrease in muscle tension, and patient report of improved symptoms ?Post-treatment instructions: Patient instructed to expect possible mild to moderate muscle soreness later today and/or tomorrow. Patient instructed in methods to reduce muscle soreness and to continue prescribed HEP. If patient was dry needled over the lung field, patient was instructed on signs and  symptoms of pneumothorax and, however unlikely, to see immediate medical attention should they occur. Patient was also educated on signs and symptoms of infection and to seek medical attention should they occur. Patient verbalized understanding of these instructions and education. ? ?PATIENT EDUCATION:  ?Education details: HEP update ?Person educated: Patient ?Education method: Explanation, Demonstration, Tactile cues, Verbal cues, and Handouts ?Education comprehension: verbalized understanding, returned demonstration, verbal cues required, and tactile cues required ? ?HOME EXERCISE PROGRAM: ?Access Code: [email or web address] ?  ?  ? ASSESSMENT: ? CLINICAL IMPRESSION: ?Patient tolerated therapy well with no adverse effects. Continued with TPDN this visit eliciting twitch response and patient with good therapeutic benefit. Therapy continues to focus on progressing mobility and postural strength and control. Patient able to tolerate progression in resistance with exercises well, he does require cueing for posture and control with exercises and he did report occasional popping of the right shoulder with exercises. Updated HEP with good tolerance.  Patient would benefit from continued skilled PT to progress his mobility and strength in  order to reduce pain and maximize functional ability. ?  ?  ?OBJECTIVE IMPAIRMENTS decreased activity tolerance, decreased endurance, decreased knowledge of condition, decreased mobility, dizziness, impaired sensation, and pain.  ?  ?ACTIVITY LIMITATIONS community activity and occupation.  ?  ?PERSONAL FACTORS Fitness, Past/current experiences, Time since onset of injury/illness/exacerbation, and 1 comorbidity: prediabetes  are also affecting patient's functional outcome.  ?  ? ?GOALS: ?Goals reviewed with patient? Yes ?  ?SHORT TERM GOALS= LONG TERM GOALS DUE TO POC LENGTH: Target date: 04/16/2022 ?  ?Pt will be independent with HEP for improved strength, balance, ROM and pain modulation.  ?Baseline:   ?03/05/2022: progressing HEP ?Goal status: ONGOING ?  ?2.  Pt will improve FGA to >/= 29/30 to demonstrate improved balance and reduced fall risk ?Baseline: 27/30 ?Goal status: DEFERRED ?  ?3.  Pt will improve ND

## 2022-03-11 ENCOUNTER — Ambulatory Visit: Payer: 59 | Admitting: Physical Therapy

## 2022-03-11 ENCOUNTER — Encounter: Payer: Self-pay | Admitting: Physical Therapy

## 2022-03-11 ENCOUNTER — Other Ambulatory Visit: Payer: Self-pay

## 2022-03-11 DIAGNOSIS — M542 Cervicalgia: Secondary | ICD-10-CM | POA: Diagnosis not present

## 2022-03-11 DIAGNOSIS — R42 Dizziness and giddiness: Secondary | ICD-10-CM | POA: Diagnosis not present

## 2022-03-11 NOTE — Patient Instructions (Signed)
Access Code: [email or web address] ?URL: [email or web address] ?Date: 03/11/2022 ?Prepared by: Rosana Hoes ? ?Exercises ?- Seated Cervical Sidebending Stretch  - 1 x daily - 3 reps - 20 seconds hold ?- Seated Levator Scapulae Stretch  - 1 x daily - 3 reps - 30 seconds hold ?- Seated Passive Cervical Retraction  - 1 x daily - 3 sets - 10 reps - 5 seconds hold ?- Banded Row  - 1 x daily - 3 sets - 15 reps ?- Shoulder External Rotation and Scapular Retraction with Resistance  - 1 x daily - 3 sets - 15 reps ?- Supine Shoulder Horizontal Abduction with Resistance  - 1 x daily - 3 sets - 15 reps ?

## 2022-03-23 NOTE — Therapy (Signed)
?OUTPATIENT PHYSICAL THERAPY TREATMENT NOTE ? ?DISCHARGE ? ? ?Patient Name: Trevor Wallace. ?MRN: [id number] ?DOB:09/14/1976, 46 y.o., male ?Today's Date: 03/24/2022 ? ?PCP: Corwin Levins, MD ?REFERRING PROVIDER: Corwin Levins, MD ? ? PT End of Session - 03/24/22 4098   ? ? Visit Number 7   ? Number of Visits 11   ? Date for PT Re-Evaluation 04/16/22   ? Authorization Type AETNA   ? Progress Note Due on Visit 10   ? PT Start Time [phone]   ? PT Stop Time 1000   ? PT Time Calculation (min) 43 min   ? Activity Tolerance Patient tolerated treatment well   ? Behavior During Therapy [facility] for tasks assessed/performed   ? ?  ?  ? ?  ? ? ? ? ? ?Past Medical History:  ?Diagnosis Date  ? Prediabetes   ? ?History reviewed. No pertinent surgical history. ?Patient Active Problem List  ? Diagnosis Date Noted  ? Abdominal pain 01/16/2022  ? Left sided abdominal pain 01/16/2022  ? Right shoulder pain 10/15/2021  ? Vitamin D deficiency 08/13/2021  ? HLD (hyperlipidemia) 08/13/2021  ? Migraine 08/13/2021  ? Cervical myelopathy (HCC) 06/09/2021  ? Gait disorder 06/09/2021  ? RLS (restless legs syndrome) 06/09/2021  ? COVID-19 virus infection 10/27/2020  ? Urinary frequency 04/10/2020  ? Elevated LFTs 09/27/2019  ? Encounter for well adult exam with abnormal findings 02/15/2017  ? Former smoker 02/15/2017  ? Dysuria 02/15/2017  ? Diabetes [facility])   ? ? ?ONSET DATE: 01/12/2022 (referral) ? ?REFERRING DIAG: Gait disorder, Cervical myelopathy ? ?THERAPY DIAG:  ?Cervicalgia ? ?PERTINENT HISTORY: None ? ?PRECAUTIONS: On light duty at work (lifting 10 pounds or fewer)  ? ?SUBJECTIVE: Patient reports neck is feeling good today. Denies any neck pain. States he is ready to be released from therapy. ? ?PAIN: ?Are you having pain? No:  ?NPRS scale: 0/10 ?Pain location: Neck ?Pain description: Intermittent, sharp, aching, tight ?Aggravating factors: Movement, rotation ?Relieving factors: Rest, hold neck still ?  ?There were no vitals filed for this  visit. ? ?There is no height or weight on file to calculate BMI. ? ? ?OBJECTIVE:  ?POSTURE:  ?Rounded shoulders, forward head - 03/05/2022 ? ?Cervical AROM/PROM: ?  ?A/PROM A/PROM (deg) ?01/28/2022  ? ?03/05/2022  ? ?03/24/2022  ?Flexion 30 50   ?Extension 20 40   ?Right lateral flexion 12 25 35  ?Left lateral flexion 10 30 35  ?Right rotation 35 65 70  ?Left rotation 25 60 70  ?*left sided neck tightness with left rotation ? ?Neck Disability Index (NDI): 3/50 (6%) - 03/24/2022 ?22 /50 (44%) - 03/05/2022 (26/50 - 02/02/2022) ? ? ?TODAY'S TREATMENT ?[facility] Adult PT Treatment:                                                DATE: 03/24/2022 ?Therapeutic Exercise: ?Upper trap and levator scap stretch 2 x 15 sec each ?Supine chin tuck with finger assist 10 x 5 sec hold ?Supine horizontal abduction with blue 2 x 15 ?Sidelying thoracic rotation x 10 each ?Seated chin tuck with blue band 3 x 10 with 5 sec hold ?Seated thoracic extension with hand behind head 2 x 5 with 5 sec hold ?Machine low row 55# 2 x 12 ?Machine high row 45# 2 x 12 ?Machine lat pull down 45# 2 x 12 ?  Push-up plus from elevated table 2 x 15 ?Double ER and scap retraction with blue 2 x 10 ? ? ?[facility] Adult PT Treatment:                                                DATE: 03/11/2022 ?Therapeutic Exercise: ?Upper trap and levator scap stretch 2 x 15 sec each ?Sidelying thoracic rotation x 10 each ?Seated chin tuck with finger PROM 2 x 10 with 5 sec hold ?Machine low row 45# 2 x 12 ?Machine high row 35# 2 x 12 ?Machine lat pull down 35# 2 x 12 ?Supine horizontal abduction with blue 2 x 15 ?Supine serratus punch 15# 2 x 10 ?Double ER and scap retraction with blue 2 x 15 ?Manual Therapy: ?Skilled palpation and monitoring of muscle tension while performing TPDN treatment ?Suboccipital release with gentle manual traction ?Trigger Point Dry Needling Treatment: ?Pre-treatment instruction: Patient instructed on dry needling rationale, procedures, and possible side effects  including pain during treatment (achy,cramping feeling), bruising, drop of blood, lightheadedness, nausea, sweating. ?Patient Consent Given: Yes ?Education handout provided: Previously provided ?Muscles treated: Bilateral upper traps, bilateral suboccipitals, right infraspinatus and posterior deltoid ?Needle size and number: .30x32mm x 8 ?Electrical stimulation performed: No ?Parameters: N/A ?Treatment response/outcome: Twitch response elicited, Palpable decrease in muscle tension, and patient report of improved symptoms ?Post-treatment instructions: Patient instructed to expect possible mild to moderate muscle soreness later today and/or tomorrow. Patient instructed in methods to reduce muscle soreness and to continue prescribed HEP. If patient was dry needled over the lung field, patient was instructed on signs and symptoms of pneumothorax and, however unlikely, to see immediate medical attention should they occur. Patient was also educated on signs and symptoms of infection and to seek medical attention should they occur. Patient verbalized understanding of these instructions and education. ? ?[facility] Adult PT Treatment:                                                DATE: 03/05/2022 ?Therapeutic Exercise: ?Upper trap and levator scap stretch 2 x 30 sec each ?Seated chin tuck with finger PROM 2 x 10 with 5 sec hold ?Machine row 45# 3 x 10 ?Row with blue x 15 ?Double ER and scap retraction with blue 3 x 15 ?Manual Therapy: ?Skilled palpation and monitoring of muscle tension while performing TPDN treatment ?Suboccipital release with gentle manual traction ?STM bilateral upper trap regions ?Trigger Point Dry Needling Treatment: ?Pre-treatment instruction: Patient instructed on dry needling rationale, procedures, and possible side effects including pain during treatment (achy,cramping feeling), bruising, drop of blood, lightheadedness, nausea, sweating. ?Patient Consent Given: Yes ?Education handout provided: Previously  provided ?Muscles treated: Bilateral upper traps, right suboccipitals, right levator scap, right infraspinatus  ?Needle size and number: .30x25mm x 6 ?Electrical stimulation performed: No ?Parameters: N/A ?Treatment response/outcome: Twitch response elicited, Palpable decrease in muscle tension, and patient report of improved symptoms ?Post-treatment instructions: Patient instructed to expect possible mild to moderate muscle soreness later today and/or tomorrow. Patient instructed in methods to reduce muscle soreness and to continue prescribed HEP. If patient was dry needled over the lung field, patient was instructed on signs and symptoms of pneumothorax and, however unlikely, to see immediate medical attention  should they occur. Patient was also educated on signs and symptoms of infection and to seek medical attention should they occur. Patient verbalized understanding of these instructions and education. ? ?PATIENT EDUCATION:  ?Education details: POC discharge, HEP update ?Person educated: Patient ?Education method: Explanation, Demonstration, and Handouts ?Education comprehension: Verbalized understanding, returned demonstration ? ?HOME EXERCISE PROGRAM: ?Access Code: [email or web address] ?  ?  ? ASSESSMENT: ? CLINICAL IMPRESSION: ?Patient tolerated therapy well with no adverse effects. He arrived reporting no pain so deferred TPDN this visit. Therapy focuses primarily on progressing mobility and postural strength and control. He did report some soreness of the right shoulder with resistance exercises but no reports of pain in the neck. He notes slight tightness of left neck with left cervical rotation but motion continues to improve and patient denies any limitation with activities. Overall, patient has improved with therapy, demonstrating an improvement in his cervical motion, report of functional ability, and reduced pain with activity. Patient will be formally discharged from PT as he has achieved established goals and  requests release from PT. ?  ?  ?OBJECTIVE IMPAIRMENTS decreased activity tolerance, decreased endurance, decreased knowledge of condition, decreased mobility, dizziness, impaired sensation, and pain.  ?

## 2022-03-24 ENCOUNTER — Ambulatory Visit: Payer: 59 | Attending: Sports Medicine | Admitting: Physical Therapy

## 2022-03-24 ENCOUNTER — Other Ambulatory Visit: Payer: Self-pay

## 2022-03-24 ENCOUNTER — Encounter: Payer: Self-pay | Admitting: Physical Therapy

## 2022-03-24 DIAGNOSIS — M542 Cervicalgia: Secondary | ICD-10-CM | POA: Insufficient documentation

## 2022-03-24 NOTE — Patient Instructions (Signed)
Access Code: [email or web address] ?URL: [email or web address] ?Date: 03/24/2022 ?Prepared by: Rosana Hoes ? ?Exercises ?- Seated Cervical Sidebending Stretch  - 1 x daily - 3 reps - 20 seconds hold ?- Seated Levator Scapulae Stretch  - 1 x daily - 3 reps - 30 seconds hold ?- Banded Row  - 1 x daily - 3 sets - 15 reps ?- Shoulder External Rotation and Scapular Retraction with Resistance  - 1 x daily - 3 sets - 15 reps ?- Supine Shoulder Horizontal Abduction with Resistance  - 1 x daily - 3 sets - 15 reps ?- Cervical Retraction with Resistance  - 1 x daily - 7 x weekly - 3 sets - 10 reps - 5 seconds hold ?

## 2022-03-30 ENCOUNTER — Ambulatory Visit (HOSPITAL_COMMUNITY)
Admission: EM | Admit: 2022-03-30 | Discharge: 2022-03-30 | Disposition: A | Discharge status: Home / Self Care | Payer: 59 | Attending: Emergency Medicine | Admitting: Emergency Medicine

## 2022-03-30 ENCOUNTER — Encounter (HOSPITAL_COMMUNITY): Payer: Self-pay

## 2022-03-30 DIAGNOSIS — R52 Pain, unspecified: Secondary | ICD-10-CM

## 2022-03-30 MED ORDER — KETOROLAC TROMETHAMINE 30 MG/ML IJ SOLN
INTRAMUSCULAR | Status: AC
  Filled 2022-03-30: qty 1

## 2022-03-30 MED ORDER — KETOROLAC TROMETHAMINE 30 MG/ML IJ SOLN
30.0000 mg | Freq: Once | INTRAMUSCULAR | Status: AC
  Administered 2022-03-30: 30 mg via INTRAMUSCULAR

## 2022-03-30 NOTE — ED Triage Notes (Signed)
3 days ago, Pt reports that he woke up with a HA and generalized body aches. Has been taking muscle relaxers and maxalt. No falls or injuries. Bending aggravates sxs.  ?
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
## 2022-03-30 NOTE — Discharge Instructions (Signed)
Talk with Dr. Jonny Ruiz about what health problems or medicines might be causing your frequent muscle aches.  ?

## 2022-04-15 ENCOUNTER — Ambulatory Visit: Payer: 59 | Admitting: Internal Medicine

## 2022-04-20 ENCOUNTER — Ambulatory Visit (HOSPITAL_COMMUNITY)
Admission: EM | Admit: 2022-04-20 | Discharge: 2022-04-20 | Disposition: A | Discharge status: Home / Self Care | Payer: 59 | Attending: Emergency Medicine | Admitting: Emergency Medicine

## 2022-04-20 ENCOUNTER — Encounter (HOSPITAL_COMMUNITY): Payer: Self-pay

## 2022-04-20 DIAGNOSIS — R519 Headache, unspecified: Secondary | ICD-10-CM | POA: Diagnosis not present

## 2022-04-20 DIAGNOSIS — G8929 Other chronic pain: Secondary | ICD-10-CM

## 2022-04-20 DIAGNOSIS — R079 Chest pain, unspecified: Secondary | ICD-10-CM

## 2022-04-20 LAB — CBG MONITORING, ED: Glucose-Capillary: 100 mg/dL — ABNORMAL HIGH (ref 70–99)

## 2022-04-20 MED ORDER — KETOROLAC TROMETHAMINE 30 MG/ML IJ SOLN
INTRAMUSCULAR | Status: AC
  Filled 2022-04-20: qty 1

## 2022-04-20 MED ORDER — KETOROLAC TROMETHAMINE 60 MG/2ML IM SOLN
30.0000 mg | Freq: Once | INTRAMUSCULAR | Status: AC
  Administered 2022-04-20: 30 mg via INTRAMUSCULAR
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
## 2022-04-20 NOTE — Discharge Instructions (Addendum)
I recommend you follow-up with your primary care provider.  Please go to the emergency department if symptoms worsen
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
## 2022-06-15 ENCOUNTER — Encounter (HOSPITAL_COMMUNITY): Payer: Self-pay

## 2022-06-15 ENCOUNTER — Ambulatory Visit (HOSPITAL_COMMUNITY)
Admission: EM | Admit: 2022-06-15 | Discharge: 2022-06-15 | Disposition: A | Discharge status: Home / Self Care | Payer: 59 | Attending: Family Medicine | Admitting: Family Medicine

## 2022-06-15 DIAGNOSIS — R42 Dizziness and giddiness: Secondary | ICD-10-CM

## 2022-06-15 DIAGNOSIS — R110 Nausea: Secondary | ICD-10-CM

## 2022-06-15 DIAGNOSIS — R519 Headache, unspecified: Secondary | ICD-10-CM

## 2022-06-15 LAB — POCT URINALYSIS DIPSTICK, ED / UC
Bilirubin Urine: NEGATIVE
Glucose, UA: NEGATIVE mg/dL
Hgb urine dipstick: NEGATIVE
Ketones, ur: 15 mg/dL — AB
Leukocytes,Ua: NEGATIVE
Nitrite: NEGATIVE
Protein, ur: NEGATIVE mg/dL
Specific Gravity, Urine: >=1.03 (ref 1.005–1.030)
Urobilinogen, UA: 0.2 mg/dL (ref 0.0–1.0)
pH: 5.5 (ref 5.0–8.0)

## 2022-06-15 LAB — CBG MONITORING, ED: Glucose-Capillary: 130 mg/dL — ABNORMAL HIGH (ref 70–99)

## 2022-06-15 MED ORDER — ONDANSETRON 4 MG PO TBDP: 4.0000 mg | ORAL_TABLET | Freq: Three times a day (TID) | ORAL | 0 refills | Status: DC | PRN

## 2022-06-15 NOTE — Discharge Instructions (Signed)
Please do your best to ensure adequate fluid intake in order to avoid dehydration. If you find that you are unable to tolerate drinking fluids regularly please proceed to the Emergency Department for evaluation. ° ° °

## 2022-06-15 NOTE — ED Triage Notes (Signed)
Pt c/o headache, dizziness, and nausea while at work last night. Denies taking any meds.

## 2022-06-16 NOTE — ED Provider Notes (Signed)
[facility] CARE CENTER   [id number] 06/15/22 Arrival Time: 1845  ASSESSMENT & PLAN:  1. Nausea   2. Acute nonintractable headache, unspecified headache type   3. Lightheadedness    Labs Reviewed  POCT URINALYSIS DIPSTICK, ED / UC - Abnormal; Notable for the following components:      Result Value   Ketones, ur 15 (*)    All other components within normal limits  CBG MONITORING, ED - Abnormal; Notable for the following components:   Glucose-Capillary 130 (*)    All other components within normal limits    Will do his best to ensure adequate hydration. Ques heat-related illness. Work note provided. Does need f/u with higher blood sugar. Did eat dinner 1-2 h ago.  Meds ordered this encounter  Medications   ondansetron (ZOFRAN-ODT) 4 MG disintegrating tablet    Sig: Take 1 tablet (4 mg total) by mouth every 8 (eight) hours as needed for nausea or vomiting.    Dispense:  15 tablet    Refill:  0   Normal neurological exam. Without fever, focal neuro logical deficits, nuchal rigidity, or change in vision. No indication for urgent neurodiagnostic workup at this time. Discussed. He is comfortable with home observation.  Recommend:  Follow-up Information     [facility] EMERGENCY DEPARTMENT.   Specialty: Emergency Medicine Why: If symptoms worsen in any way. Contact information: [address] [id number] mc [place] [address] [phone]                 Reviewed expectations re: course of current medical issues. Questions answered. Outlined signs and symptoms indicating need for more acute intervention. Patient verbalized understanding. After Visit Summary given.   SUBJECTIVE: History from: patient. Patient is able to give a clear and coherent history.  Trevor Wallace. is a 46 y.o. male who presents with complaint of a migraine headache, lightheadedness, nausea. Since last evening; left work early. Works at [employer] in hot  environment; ques relation. No emesis but continued nausea. Did sleep most of morning. Ambulatory. No extremity sensation changes or weakness. No back pain. No CP/SOB. No abd pain. Normal urination.    OBJECTIVE:  Vitals:   06/15/22 1951  BP: 111/72  Pulse: 75  Resp: 18  Temp: 97.9 F (36.6 C)  TempSrc: Oral  SpO2: 99%    General appearance: alert; NAD HENT: normocephalic; atraumatic Eyes: PERRLA; EOMI; conjunctivae normal Neck: supple with FROM Lungs: clear to auscultation bilaterally; unlabored respirations Heart: regular r Extremities: no edema; symmetrical with no gross deformities Skin: warm and dry Neurologic: alert; speech is fluent and clear without dysarthria or aphasia; CN 2-12 grossly intact; no facial droop; normal gait; normal symmetric reflexes; normal extremity strength and sensation throughout; bilateral upper and lower extremity sensation is grossly intact with 5/5 symmetric strength; normal grip strength Psychological: alert and cooperative; normal mood and affect  Labs: Results for orders placed or performed during the hospital encounter of 06/15/22  POC Urinalysis dipstick  Result Value Ref Range   Glucose, UA NEGATIVE NEGATIVE mg/dL   Bilirubin Urine NEGATIVE NEGATIVE   Ketones, ur 15 (A) NEGATIVE mg/dL   Specific Gravity, Urine >=1.030 1.005 - 1.030   Hgb urine dipstick NEGATIVE NEGATIVE   pH 5.5 5.0 - 8.0   Protein, ur NEGATIVE NEGATIVE mg/dL   Urobilinogen, UA 0.2 0.0 - 1.0 mg/dL   Nitrite NEGATIVE NEGATIVE   Leukocytes,Ua NEGATIVE NEGATIVE  POC CBG monitoring  Result Value Ref Range   Glucose-Capillary 130 (H)  70 - 99 mg/dL   Labs Reviewed  POCT URINALYSIS DIPSTICK, ED / UC - Abnormal; Notable for the following components:      Result Value   Ketones, ur 15 (*)    All other components within normal limits  CBG MONITORING, ED - Abnormal; Notable for the following components:   Glucose-Capillary 130 (*)    All other components within normal  limits     Allergies  Allergen Reactions   Metformin And Related Other (See Comments)    Loose stools and bowel frequency    Past Medical History:  Diagnosis Date   Prediabetes    Social History   Socioeconomic History   Marital status: Significant Other    Spouse name: Not on file   Number of children: Not on file   Years of education: Not on file   Highest education level: Not on file  Occupational History   Not on file  Tobacco Use   Smoking status: Former    Types: Cigarettes   Smokeless tobacco: Never  Vaping Use   Vaping Use: Never used  Substance and Sexual Activity   Alcohol use: No    Alcohol/week: 0.0 standard drinks of alcohol   Drug use: No   Sexual activity: Yes  Other Topics Concern   Not on file  Social History Narrative   Right Handed    Lives in a one story home    Social Determinants of Health   Financial Resource Strain: Not on file  Food Insecurity: Not on file  Transportation Needs: Not on file  Physical Activity: Not on file  Stress: Not on file  Social Connections: Not on file  Intimate Partner Violence: Not on file   Family History  Problem Relation Age of Onset   Healthy Mother    Cancer Father    Diabetes Other    History reviewed. No pertinent surgical history.    Mardella Layman, MD 06/16/22 1010

## 2022-07-02 IMAGING — DX DG CHEST 2V
2 series · 2 of 2 positions shown · non-contrast
Comparison: 08/07/2021

CLINICAL DATA: Chest pain

EXAM:
CHEST - 2 VIEW

[chest pa]
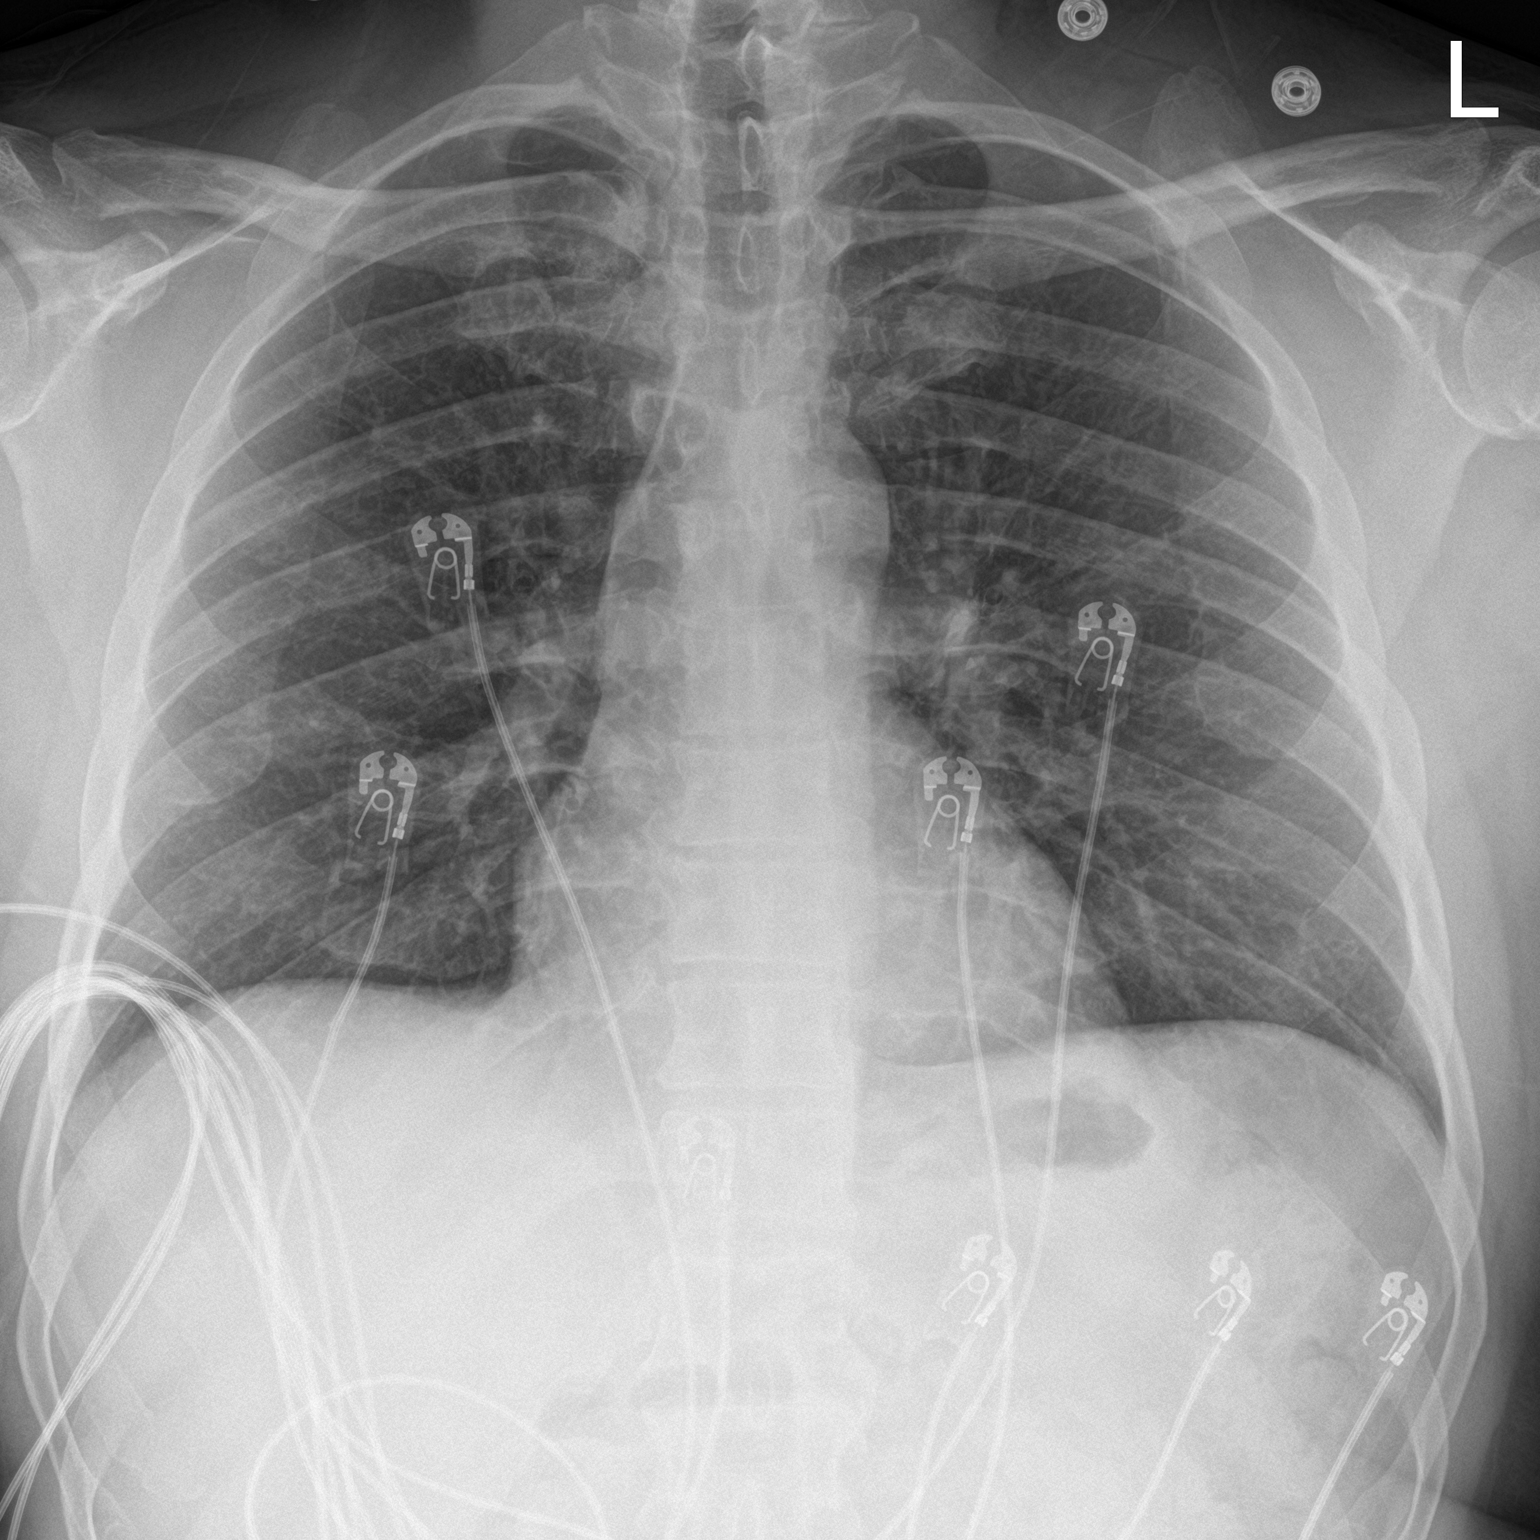

[chest lat]
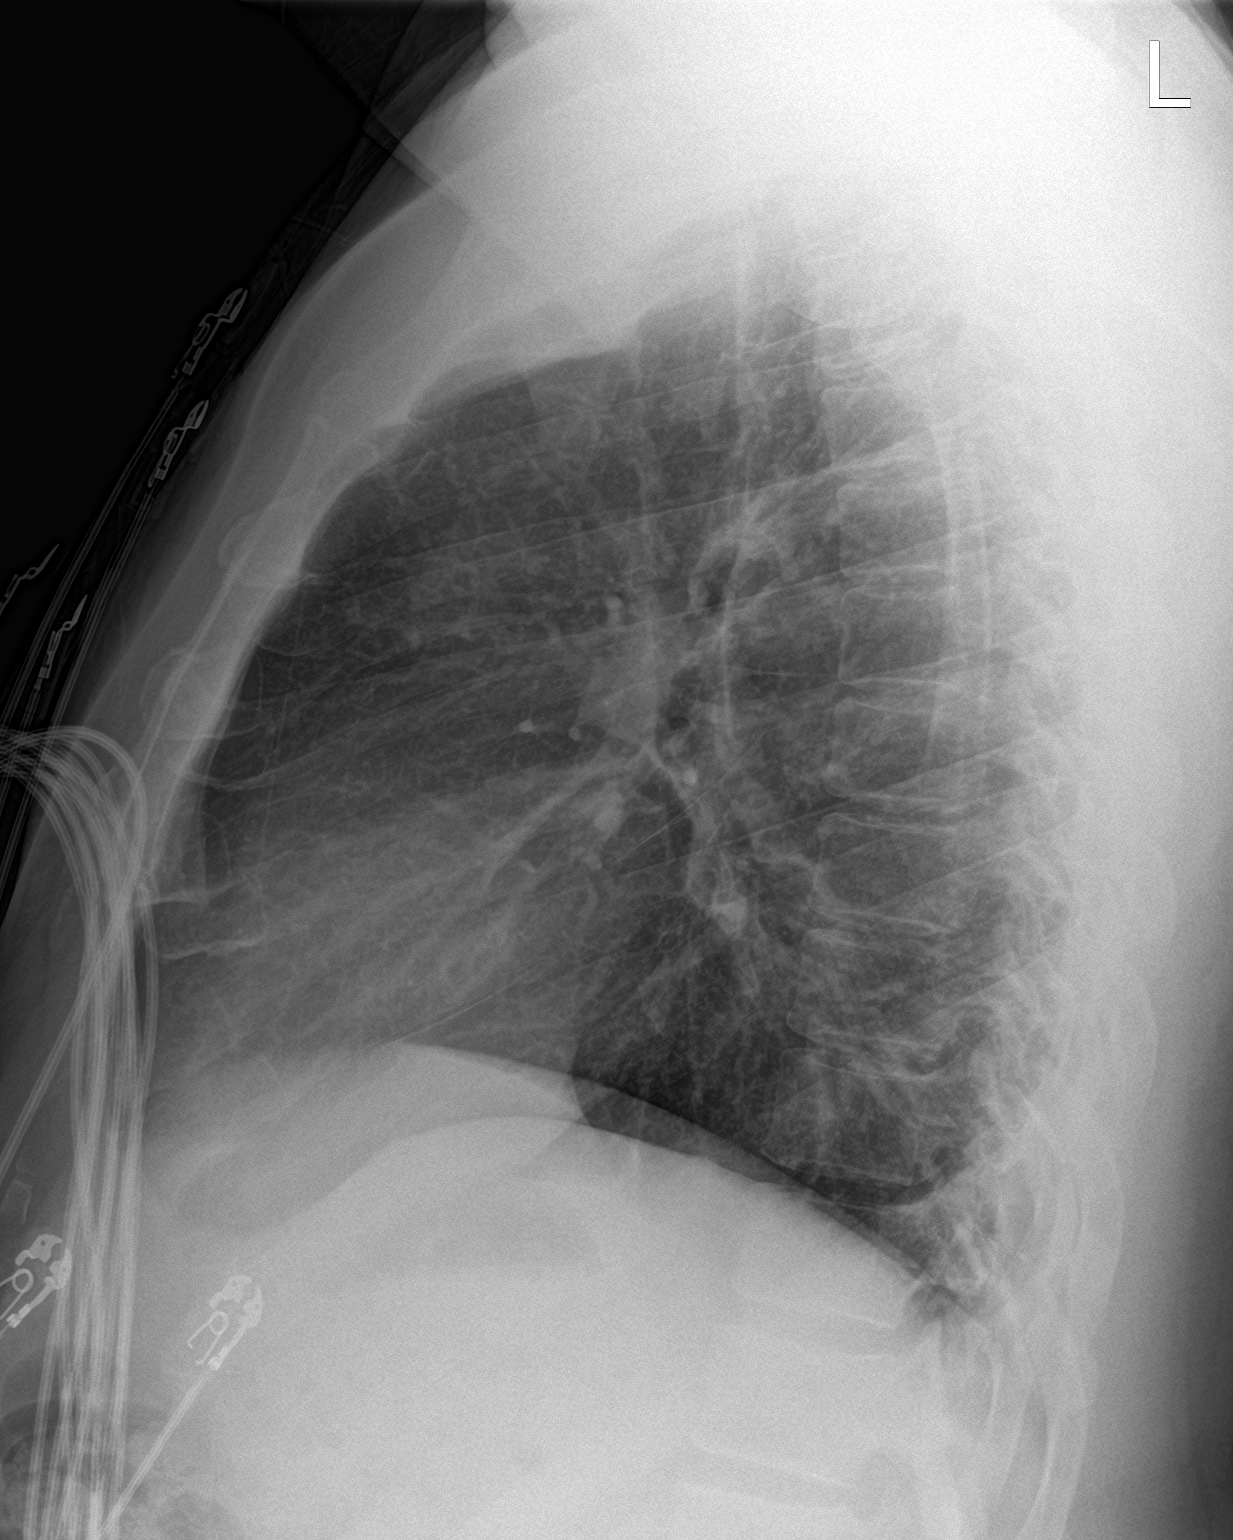

[2 of 2 positions shown; findings below may reference images not displayed]

FINDINGS: Artifact from EKG leads.

Normal heart size and mediastinal contours. No acute infiltrate or
edema. No effusion or pneumothorax. No acute osseous findings.
IMPRESSION: Negative chest.

## 2022-07-06 ENCOUNTER — Encounter (HOSPITAL_COMMUNITY): Payer: Self-pay

## 2022-07-06 ENCOUNTER — Ambulatory Visit (HOSPITAL_COMMUNITY)
Admission: EM | Admit: 2022-07-06 | Discharge: 2022-07-06 | Disposition: A | Discharge status: Home / Self Care | Payer: 59 | Attending: Internal Medicine | Admitting: Internal Medicine

## 2022-07-06 DIAGNOSIS — E119 Type 2 diabetes mellitus without complications: Secondary | ICD-10-CM | POA: Insufficient documentation

## 2022-07-06 DIAGNOSIS — H6121 Impacted cerumen, right ear: Secondary | ICD-10-CM

## 2022-07-06 DIAGNOSIS — Z87891 Personal history of nicotine dependence: Secondary | ICD-10-CM | POA: Insufficient documentation

## 2022-07-06 DIAGNOSIS — R079 Chest pain, unspecified: Secondary | ICD-10-CM

## 2022-07-06 DIAGNOSIS — R0789 Other chest pain: Secondary | ICD-10-CM

## 2022-07-06 DIAGNOSIS — U071 COVID-19: Secondary | ICD-10-CM | POA: Insufficient documentation

## 2022-07-06 DIAGNOSIS — E785 Hyperlipidemia, unspecified: Secondary | ICD-10-CM | POA: Insufficient documentation

## 2022-07-06 DIAGNOSIS — J069 Acute upper respiratory infection, unspecified: Secondary | ICD-10-CM

## 2022-07-06 LAB — SARS CORONAVIRUS 2 BY RT PCR: SARS Coronavirus 2 by RT PCR: POSITIVE — AB

## 2022-07-06 LAB — POCT RAPID STREP A, ED / UC: Streptococcus, Group A Screen (Direct): NEGATIVE

## 2022-07-06 MED ORDER — BENZONATATE 100 MG PO CAPS: 100.0000 mg | ORAL_CAPSULE | Freq: Three times a day (TID) | ORAL | 0 refills | Status: DC

## 2022-07-06 MED ORDER — ACETAMINOPHEN 325 MG PO TABS
ORAL_TABLET | ORAL | Status: AC
  Filled 2022-07-06: qty 3

## 2022-07-06 MED ORDER — IBUPROFEN 600 MG PO TABS: 600.0000 mg | ORAL_TABLET | Freq: Four times a day (QID) | ORAL | 0 refills | Status: DC | PRN

## 2022-07-06 MED ORDER — ACETAMINOPHEN 325 MG PO TABS
975.0000 mg | ORAL_TABLET | Freq: Once | ORAL | Status: AC
  Administered 2022-07-06: 975 mg via ORAL

## 2022-07-06 MED ORDER — GUAIFENESIN ER 1200 MG PO TB12: 1200.0000 mg | ORAL_TABLET | Freq: Two times a day (BID) | ORAL | 0 refills | Status: DC

## 2022-07-06 MED ORDER — KETOROLAC TROMETHAMINE 30 MG/ML IJ SOLN
30.0000 mg | Freq: Once | INTRAMUSCULAR | Status: AC
  Administered 2022-07-06: 30 mg via INTRAMUSCULAR

## 2022-07-06 MED ORDER — ACETAMINOPHEN 500 MG PO TABS: 1000.0000 mg | ORAL_TABLET | Freq: Four times a day (QID) | ORAL | 0 refills | Status: DC | PRN

## 2022-07-06 MED ORDER — KETOROLAC TROMETHAMINE 30 MG/ML IJ SOLN
INTRAMUSCULAR | Status: AC
  Filled 2022-07-06: qty 1

## 2022-07-06 NOTE — Discharge Instructions (Addendum)
Your EKG shows progressive changes indicating possible cardiac relation to your chest pain.  I would like for you to go to the nearest emergency department for further evaluation and work-up of the changes to your EKG to rule out acute cardiac involvement.  I have a high suspicion you may have COVID-19 viral illness and this increases your risk for clotting which can lead to acute heart and lung problems.   Please go to the nearest emergency department for work-up of this.   I have sent in prescriptions to your pharmacy to treat your viral upper respiratory tract infection.  Return to urgent care as needed.

## 2022-07-06 NOTE — ED Triage Notes (Signed)
The pt c/o headache, congestion, body aches, eye discomfort (burning) and change in taste.  Started: Sunday

## 2022-07-06 NOTE — ED Provider Notes (Addendum)
[facility]    CSN: [id number] Arrival date & time: 07/06/22  4403      History   Chief Complaint Chief Complaint  Patient presents with   Nasal Congestion   Generalized Body Aches    HPI Trevor Majchrzak. is a 46 y.o. male.   Patient presents to urgent care for evaluation of headache, nasal congestion, sore throat, and dry mouth with associated body aches that started overnight while he was at work in the early hours at 1 AM on Monday, July 04, 2022.  States since then, his symptoms have worsened as of today.  Headache is generalized and an 10 on a scale of 0-10 at this time.  Throat pain causes patient to have a "weird taste in his mouth that tastes like last time he had COVID" and have difficulty swallowing due to the pain.  Nasal congestion is thick and white.  Mouth is dry and patient states he has been drinking a lot of water to stay well-hydrated.  Reports intermittent chest discomfort with associated shortness of breath and nausea that comes and goes.  States when he was standing up from a seated position yesterday, he became short of breath, lightheaded, and developed this left sternal chest pain that at that time he rated a 10 on a scale of 0-10.  Chest pain subsided after approximately 5 to 10 minutes without medication/intervention.  Cannot identify any triggering or relieving factors for pain.  He is a former smoker and recently changed his diet to vegan 2 months ago.  He has a history of hyperlipidemia, diabetes mellitus, and migraine headaches.  Patient states that his symptoms are very similar to how he felt last time he had COVID-19 virus.      Past Medical History:  Diagnosis Date   Prediabetes     Patient Active Problem List   Diagnosis Date Noted   Abdominal pain 01/16/2022   Left sided abdominal pain 01/16/2022   Right shoulder pain 10/15/2021   Vitamin D deficiency 08/13/2021   HLD (hyperlipidemia) 08/13/2021   Migraine 08/13/2021    Cervical myelopathy (HCC) 06/09/2021   Gait disorder 06/09/2021   RLS (restless legs syndrome) 06/09/2021   COVID-19 virus infection 10/27/2020   Urinary frequency 04/10/2020   Elevated LFTs 09/27/2019   Encounter for well adult exam with abnormal findings 02/15/2017   Former smoker 02/15/2017   Dysuria 02/15/2017   Diabetes (HCC)     History reviewed. No pertinent surgical history.     Home Medications    Prior to Admission medications   Medication Sig Start Date End Date Taking? Authorizing Provider  acetaminophen (TYLENOL) 500 MG tablet Take 2 tablets (1,000 mg total) by mouth every 6 (six) hours as needed. 07/06/22  Yes Carlisle Beers, FNP  benzonatate (TESSALON) 100 MG capsule Take 1 capsule (100 mg total) by mouth every 8 (eight) hours. 07/06/22  Yes Carlisle Beers, FNP  Guaifenesin 1200 MG TB12 Take 1 tablet (1,200 mg total) by mouth in the morning and at bedtime. 07/06/22  Yes Carlisle Beers, FNP  ibuprofen (ADVIL) 600 MG tablet Take 1 tablet (600 mg total) by mouth every 6 (six) hours as needed. 07/06/22  Yes Carlisle Beers, FNP  Multiple Vitamins-Minerals (MENS MULTI VITAMIN & MINERAL) TABS Take 1 tablet by mouth daily.    [provider]  ondansetron (ZOFRAN-ODT) 4 MG disintegrating tablet Take 1 tablet (4 mg total) by mouth every 8 (eight) hours as needed for nausea or  vomiting. 06/15/22   Mardella Layman, MD  rizatriptan (MAXALT-MLT) 10 MG disintegrating tablet Take 1 tablet (10 mg total) by mouth as needed for migraine. May repeat in 2 hours if needed 02/23/22   Valentino Nose, NP    Family History Family History  Problem Relation Age of Onset   Healthy Mother    Cancer Father    Diabetes Other     Social History Social History   Tobacco Use   Smoking status: Former    Types: Cigarettes   Smokeless tobacco: Never  Vaping Use   Vaping Use: Never used  Substance Use Topics   Alcohol use: No    Alcohol/week: 0.0 standard  drinks of alcohol   Drug use: No     Allergies   Metformin and related   Review of Systems Review of Systems Per HPI  Physical Exam Triage Vital Signs ED Triage Vitals  Enc Vitals Group     BP 07/06/22 0848 131/87     Pulse Rate 07/06/22 0848 77     Resp 07/06/22 0848 18     Temp 07/06/22 0848 97.9 F (36.6 C)     Temp Source 07/06/22 0848 Oral     SpO2 07/06/22 0848 100 %     Weight --      Height --      Head Circumference --      Peak Flow --      Pain Score 07/06/22 0847 10     Pain Loc --      Pain Edu? --      Excl. in GC? --    No data found.  Updated Vital Signs BP 131/87 (BP Location: Left Arm)   Pulse 77   Temp 97.9 F (36.6 C) (Oral)   Resp 18   SpO2 100%   Visual Acuity Right Eye Distance:   Left Eye Distance:   Bilateral Distance:    Right Eye Near:   Left Eye Near:    Bilateral Near:     Physical Exam Vitals and nursing note reviewed.  Constitutional:      Appearance: Normal appearance. He is not ill-appearing or toxic-appearing.     Comments: Very pleasant patient sitting on exam in position of comfort table in no acute distress.   HENT:     Head: Normocephalic and atraumatic.     Right Ear: Hearing and external ear normal. There is impacted cerumen.     Left Ear: Hearing, tympanic membrane, ear canal and external ear normal.     Nose: Congestion present.     Comments: Purulent nasal congestion to physical exam with swollen turbinates bilaterally.    Mouth/Throat:     Lips: Pink.     Mouth: Mucous membranes are moist.     Dentition: Normal dentition.     Tongue: No lesions.     Pharynx: Uvula midline. Posterior oropharyngeal erythema and uvula swelling present. No oropharyngeal exudate.     Tonsils: No tonsillar exudate.  Eyes:     General: Lids are normal. Vision grossly intact. Gaze aligned appropriately.     Extraocular Movements: Extraocular movements intact.     Conjunctiva/sclera: Conjunctivae normal.  Cardiovascular:      Rate and Rhythm: Normal rate and regular rhythm.     Heart sounds: Normal heart sounds, S1 normal and S2 normal.  Pulmonary:     Effort: Pulmonary effort is normal. No respiratory distress.     Breath sounds: Normal breath sounds and air entry.  Abdominal:     General: Bowel sounds are normal.     Palpations: Abdomen is soft.     Tenderness: There is no abdominal tenderness. There is no right CVA tenderness, left CVA tenderness or guarding.  Musculoskeletal:     Cervical back: Normal range of motion and neck supple.     Right lower leg: No edema.     Left lower leg: No edema.  Lymphadenopathy:     Cervical: Cervical adenopathy present.  Skin:    General: Skin is warm and dry.     Capillary Refill: Capillary refill takes less than 2 seconds.     Findings: No rash.     Comments: Skin turgor normal.  Neurological:     General: No focal deficit present.     Mental Status: He is alert and oriented to person, place, and time. Mental status is at baseline.     Cranial Nerves: No dysarthria or facial asymmetry.     Gait: Gait is intact.  Psychiatric:        Mood and Affect: Mood normal.        Speech: Speech normal.        Behavior: Behavior normal.        Thought Content: Thought content normal.        Judgment: Judgment normal.      UC Treatments / Results  Labs (all labs ordered are listed, but only abnormal results are displayed) Labs Reviewed  SARS CORONAVIRUS 2 BY RT PCR  CULTURE, GROUP A STREP [facility])  POCT RAPID STREP A, ED / UC    EKG   Radiology No results found.  Procedures Procedures (including critical care time)  Medications Ordered in UC Medications  acetaminophen (TYLENOL) tablet 975 mg (975 mg Oral Given 07/06/22 1013)  ketorolac (TORADOL) 30 MG/ML injection 30 mg (30 mg Intramuscular Given 07/06/22 1014)    Initial Impression / Assessment and Plan / UC Course  I have reviewed the triage vital signs and the nursing notes.  Pertinent labs & imaging  results that were available during my care of the patient were reviewed by me and considered in my medical decision making (see chart for details).   1. Viral URI with cough Symptoms and physical exam consistent with a viral upper respiratory tract infection that will likely resolve with rest, fluids, and prescriptions for symptomatic relief.  COVID-19 testing is pending.  Deferred imaging based on stable cardiopulmonary exam and hemodynamically stable vital signs. We will call patient if this is positive.  Guaifenesin, Tessalon Perles, and Tylenol/ibuprofen sent to the pharmacy to be used for nasal congestion, cough, and body aches, fever/chills, and discomfort associated with viral illness.  Nonpharmacologic interventions for symptom relief provided and after visit summary below.    Patient is a candidate for antiviral therapy due to HLD and tobacco use . Patient is on day 2 of symptoms and has been advised to stay home until COVID-19 testing results come back. If positive, may begin going into public/back to work on day 6 of symptoms if feeling better and afebrile based on CDC guidelines, but must wear mask until day 11. We will call with positive result. May check MyChart for result as well. Work note given.    2. Impacted cerumen of right ear Ear lavage performed in the clinic to remove cerumen from right ear successfully. TM normal upon re-check after ear lavage by nursing staff. Patient tolerated procedure well.   Strict ED/urgent care return precautions given.  Patient verbalizes understanding and agreement with plan.  Counseled patient regarding possible side effects and uses of all medications prescribed at today's visit.  Patient verbalizes understanding and agreement with plan.  All questions answered.  Patient discharged from urgent care in stable condition.    Final Clinical Impressions(s) / UC Diagnoses   Final diagnoses:  Viral URI with cough  Impacted cerumen of right ear   Musculoskeletal chest pain     Discharge Instructions      You have a viral upper respiratory infection.  COVID-19 testing is pending.  We will call you if your result is positive and prescribed Paxlovid at that time if indicated. Your EKG is normal.  Your chest pain is likely related to body aches due to viral illness.   Take guaifenesin 1200mg   2 times daily to thin your mucous so that you can cough it up and blow it out of your nose easier. Drink plenty of water while taking this medication so that it works well in your body (at least 8 cups a day).   Take tessalon pearles every 8 hours as needed for cough.  You may take tylenol 1,000mg  and ibuprofen 600mg  every 6 hours with food as needed for fever/chills, sore throat, aches/pains, and inflammation associated with viral illness. Take this with food to avoid stomach upset.    Your next dose of tylenol may be at 6pm.  Your next dose of ibuprofen may be at 10pm due to ketorolac injection in clinic.   We cleaned the wax out of your ear at your appointment.  Do not use any Q-tips to your ear as this only further pushes wax into the ear canal.  You may use Debrox over-the-counter eardrops if needed for earwax removal in the future.  If you develop any new or worsening symptoms, please return.  If your symptoms are severe, please go to the emergency room.  Follow-up with your primary care provider for further evaluation and management of your symptoms as well as ongoing wellness visits.  I hope you feel better!      ED Prescriptions     Medication Sig Dispense Auth. Provider   Guaifenesin 1200 MG TB12 Take 1 tablet (1,200 mg total) by mouth in the morning and at bedtime. 14 tablet M, FNP   benzonatate (TESSALON) 100 MG capsule Take 1 capsule (100 mg total) by mouth every 8 (eight) hours. 21 capsule Reita May M, FNP   ibuprofen (ADVIL) 600 MG tablet Take 1 tablet (600 mg total) by mouth every 6 (six) hours  as needed. 30 tablet Reita May M, FNP   acetaminophen (TYLENOL) 500 MG tablet Take 2 tablets (1,000 mg total) by mouth every 6 (six) hours as needed. 30 tablet Reita May, FNP      PDMP not reviewed this encounter.   M, FNP 07/08/22 1641    Carlisle Beers, FNP 08/06/22 1907

## 2022-07-07 ENCOUNTER — Telehealth (HOSPITAL_COMMUNITY): Payer: Self-pay | Admitting: Emergency Medicine

## 2022-07-07 MED ORDER — NIRMATRELVIR/RITONAVIR (PAXLOVID)TABLET: 3.0000 | ORAL_TABLET | Freq: Two times a day (BID) | ORAL | 0 refills | Status: AC

## 2022-07-08 LAB — CULTURE, GROUP A STREP (THRC)

## 2022-08-16 ENCOUNTER — Ambulatory Visit: Payer: 59 | Admitting: Internal Medicine

## 2022-08-18 ENCOUNTER — Ambulatory Visit: Payer: Self-pay | Admitting: Internal Medicine

## 2022-08-25 ENCOUNTER — Ambulatory Visit: Payer: Self-pay | Admitting: Internal Medicine

## 2022-08-30 ENCOUNTER — Ambulatory Visit (INDEPENDENT_AMBULATORY_CARE_PROVIDER_SITE_OTHER): Payer: Commercial Managed Care - HMO

## 2022-08-30 ENCOUNTER — Ambulatory Visit (INDEPENDENT_AMBULATORY_CARE_PROVIDER_SITE_OTHER): Payer: Commercial Managed Care - HMO | Admitting: Internal Medicine

## 2022-08-30 VITALS — BP 126/74 | HR 75 | Temp 98.2°F | Ht 70.0 in | Wt 212.0 lb

## 2022-08-30 DIAGNOSIS — E559 Vitamin D deficiency, unspecified: Secondary | ICD-10-CM | POA: Diagnosis not present

## 2022-08-30 DIAGNOSIS — M5441 Lumbago with sciatica, right side: Secondary | ICD-10-CM | POA: Insufficient documentation

## 2022-08-30 DIAGNOSIS — R109 Unspecified abdominal pain: Secondary | ICD-10-CM | POA: Diagnosis not present

## 2022-08-30 DIAGNOSIS — I517 Cardiomegaly: Secondary | ICD-10-CM

## 2022-08-30 DIAGNOSIS — R079 Chest pain, unspecified: Secondary | ICD-10-CM

## 2022-08-30 DIAGNOSIS — E7800 Pure hypercholesterolemia, unspecified: Secondary | ICD-10-CM

## 2022-08-30 DIAGNOSIS — E1165 Type 2 diabetes mellitus with hyperglycemia: Secondary | ICD-10-CM

## 2022-08-30 LAB — HEPATIC FUNCTION PANEL
ALT: 27 U/L (ref 0–53)
AST: 27 U/L (ref 0–37)
Albumin: 3.8 g/dL (ref 3.5–5.2)
Alkaline Phosphatase: 71 U/L (ref 39–117)
Bilirubin, Direct: 0 mg/dL (ref 0.0–0.3)
Total Bilirubin: 0.4 mg/dL (ref 0.2–1.2)
Total Protein: 6 g/dL (ref 6.0–8.3)

## 2022-08-30 LAB — URINALYSIS, ROUTINE W REFLEX MICROSCOPIC
Hgb urine dipstick: NEGATIVE
Ketones, ur: NEGATIVE
Leukocytes,Ua: NEGATIVE
Nitrite: NEGATIVE
Specific Gravity, Urine: 1.025 (ref 1.000–1.030)
Total Protein, Urine: NEGATIVE
Urine Glucose: NEGATIVE
Urobilinogen, UA: 0.2 (ref 0.0–1.0)
pH: 6 (ref 5.0–8.0)

## 2022-08-30 LAB — LIPID PANEL
Cholesterol: 176 mg/dL (ref 0–200)
HDL: 42.6 mg/dL (ref >39.00)
NonHDL: 133.75
Total CHOL/HDL Ratio: 4
Triglycerides: 220 mg/dL — ABNORMAL HIGH (ref 0.0–149.0)
VLDL: 44 mg/dL — ABNORMAL HIGH (ref 0.0–40.0)

## 2022-08-30 LAB — BASIC METABOLIC PANEL
BUN: 7 mg/dL (ref 6–23)
CO2: 31 mEq/L (ref 19–32)
Calcium: 8.5 mg/dL (ref 8.4–10.5)
Chloride: 105 mEq/L (ref 96–112)
Creatinine, Ser: 0.73 mg/dL (ref 0.40–1.50)
GFR: 108.79 mL/min (ref >60.00)
Glucose, Bld: 95 mg/dL (ref 70–99)
Potassium: 3.7 mEq/L (ref 3.5–5.1)
Sodium: 141 mEq/L (ref 135–145)

## 2022-08-30 LAB — VITAMIN D 25 HYDROXY (VIT D DEFICIENCY, FRACTURES): VITD: 16.99 ng/mL — ABNORMAL LOW (ref 30.00–100.00)

## 2022-08-30 LAB — LDL CHOLESTEROL, DIRECT: Direct LDL: 100 mg/dL

## 2022-08-30 LAB — HEMOGLOBIN A1C: Hgb A1c MFr Bld: 6.4 % (ref 4.6–6.5)

## 2022-08-30 MED ORDER — CYCLOBENZAPRINE HCL 5 MG PO TABS: 5.0000 mg | ORAL_TABLET | Freq: Three times a day (TID) | ORAL | 1 refills | Status: DC | PRN

## 2022-08-30 NOTE — Assessment & Plan Note (Signed)
Last vitamin D Lab Results  Component Value Date   VD25OH 9.83 (L) 01/13/2022   Low, now on oral replacement, for f/u lab today

## 2022-08-30 NOTE — Patient Instructions (Signed)
Please take all new medication as prescribed - the muscle relaxer as needed  Please continue all other medications as before, and refills have been done if requested.  Please have the pharmacy call with any other refills you may need.  Please continue your efforts at being more active, low cholesterol diet, and weight control.  You are otherwise up to date with prevention measures today.  Please keep your appointments with your specialists as you may have planned  You will be contacted regarding the referral for: Echocardiogram, and Sports Medicine  Please go to the XRAY Department in the first floor for the x-ray testing  Please go to the LAB at the blood drawing area for the tests to be done  You will be contacted by phone if any changes need to be made immediately.  Otherwise, you will receive a letter about your results with an explanation, but please check with MyChart first.  Please remember to sign up for MyChart if you have not done so, as this will be important to you in the future with finding out test results, communicating by private email, and scheduling acute appointments online when needed.  Please make an Appointment to return in 6 months, or sooner if needed

## 2022-08-30 NOTE — Assessment & Plan Note (Signed)
Per ECG , etiology and significance unclear, now for Echo

## 2022-08-30 NOTE — Assessment & Plan Note (Signed)
Atypical, exam benign, but for cxr

## 2022-08-30 NOTE — Assessment & Plan Note (Signed)
Lab Results  Component Value Date   HGBA1C 6.6 (H) 01/13/2022   Stable, pt to continue current medical treatment  0 diet, exercise, wt control

## 2022-08-30 NOTE — Progress Notes (Signed)
Patient ID: Trevor Scurlock., male   DOB: Sep 11, 1976, 46 y.o.   MRN: [id number]        Chief Complaint: follow up HTN, HLD and hyperglycemia, left chest pain, lower back pain and right leg tingling, abnormal ECG and right lower abd pain       HPI:  Trevor Whang. is a 46 y.o. male here after seen at [facility] approx 3 mo ago - had ECG and here today as was recommended to f/u abnormal ECG with left atrial enlargement.   Pt denies increased sob or doe, wheezing, orthopnea, PND, increased LE swelling, palpitations, dizziness or syncope, but does have recurring left anterior cp sharp, some worse with breathing deep, mild intermittent.  Also has 1 wk right lower abd tender soreness but no fever, chills and Denies worsening reflux, dysphagia, n/v, bowel change or blood.  Asks for urine testing.  Also has increased lower back pain with mild intermittent right leg tingling in the past month that just wont resolve, but no leg weakness or falls. Lost 13 lbs with better diet and exercise.  Taking Vit D. Wt Readings from Last 3 Encounters:  08/30/22 212 lb (96.2 kg)  02/07/22 220 lb (99.8 kg)  01/13/22 225 lb (102.1 kg)   BP Readings from Last 3 Encounters:  08/30/22 126/74  07/06/22 131/87  06/15/22 111/72         Past Medical History:  Diagnosis Date   Prediabetes    No past surgical history on file.  reports that he has quit smoking. His smoking use included cigarettes. He has never used smokeless tobacco. He reports that he does not drink alcohol and does not use drugs. family history includes Cancer in his father; Diabetes in an other family member; Healthy in his mother. Allergies  Allergen Reactions   Metformin And Related Other (See Comments)    Loose stools and bowel frequency   Current Outpatient Medications on File Prior to Visit  Medication Sig Dispense Refill   acetaminophen (TYLENOL) 500 MG tablet Take 2 tablets (1,000 mg total) by mouth every 6 (six) hours as needed. 30 tablet  0   benzonatate (TESSALON) 100 MG capsule Take 1 capsule (100 mg total) by mouth every 8 (eight) hours. 21 capsule 0   Guaifenesin 1200 MG TB12 Take 1 tablet (1,200 mg total) by mouth in the morning and at bedtime. 14 tablet 0   ibuprofen (ADVIL) 600 MG tablet Take 1 tablet (600 mg total) by mouth every 6 (six) hours as needed. 30 tablet 0   Multiple Vitamins-Minerals (MENS MULTI VITAMIN & MINERAL) TABS Take 1 tablet by mouth daily.     ondansetron (ZOFRAN-ODT) 4 MG disintegrating tablet Take 1 tablet (4 mg total) by mouth every 8 (eight) hours as needed for nausea or vomiting. 15 tablet 0   rizatriptan (MAXALT-MLT) 10 MG disintegrating tablet Take 1 tablet (10 mg total) by mouth as needed for migraine. May repeat in 2 hours if needed 10 tablet 0   No current facility-administered medications on file prior to visit.        ROS:  All others reviewed and negative.  Objective        PE:  BP 126/74 (BP Location: Right Arm, Patient Position: Sitting, Cuff Size: Large)   Pulse 75   Temp 98.2 F (36.8 C) (Other (Comment))   Ht 5\' 10"  (1.778 m)   Wt 212 lb (96.2 kg)   SpO2 95%   BMI 30.42 kg/m  Constitutional: Pt appears in NAD               HENT: Head: NCAT.                Right Ear: External ear normal.                 Left Ear: External ear normal.                Eyes: . Pupils are equal, round, and reactive to light. Conjunctivae and EOM are normal               Nose: without d/c or deformity               Neck: Neck supple. Gross normal ROM               Cardiovascular: Normal rate and regular rhythm.                 Pulmonary/Chest: Effort normal and breath sounds without rales or wheezing.                Abd:  Soft, NT, ND, + BS, no organomegaly               Neurological: Pt is alert. At baseline orientation, motor grossly intact               Skin: Skin is warm. No rashes, no other new lesions, LE edema - none               Psychiatric: Pt behavior is normal  without agitation   Micro: none  Cardiac tracings I have personally interpreted today:  ECG - NSR 71 with possible LAA  Pertinent Radiological findings (summarize): none   Lab Results  Component Value Date   WBC 9.2 02/07/2022   HGB 14.3 02/07/2022   HCT 46.9 02/07/2022   PLT 330 02/07/2022   GLUCOSE 106 (H) 02/07/2022   CHOL 204 (H) 01/13/2022   TRIG 229.0 (H) 01/13/2022   HDL 45.50 01/13/2022   LDLDIRECT 129.0 01/13/2022   LDLCALC 135 (H) 02/15/2017   ALT 27 01/13/2022   AST 22 01/13/2022   NA 140 02/07/2022   K 3.7 02/07/2022   CL 107 02/07/2022   CREATININE 1.00 02/07/2022   BUN 11 02/07/2022   CO2 28 02/07/2022   TSH 1.21 01/13/2022   PSA 0.07 (L) 01/13/2022   HGBA1C 6.6 (H) 01/13/2022   MICROALBUR 1.1 01/13/2022   Assessment/Plan:  Trevor Osten. is a 46 y.o. Black or African American [2] male with  has a past medical history of Prediabetes.  Vitamin D deficiency Last vitamin D Lab Results  Component Value Date   VD25OH 9.83 (L) 01/13/2022   Low, now on oral replacement, for f/u lab today  Right sided abdominal pain Exam benign, etiology unclear, for UA with labs  HLD (hyperlipidemia) Lab Results  Component Value Date   LDLCALC 135 (H) 02/15/2017   Uncontrolled, pt to continue current diet and f/u lab today    Current Outpatient Medications (Respiratory):    benzonatate (TESSALON) 100 MG capsule, Take 1 capsule (100 mg total) by mouth every 8 (eight) hours.   Guaifenesin 1200 MG TB12, Take 1 tablet (1,200 mg total) by mouth in the morning and at bedtime.  Current Outpatient Medications (Analgesics):    acetaminophen (TYLENOL) 500 MG tablet, Take 2 tablets (1,000 mg total) by mouth every 6 (six) hours as needed.   ibuprofen (ADVIL)  600 MG tablet, Take 1 tablet (600 mg total) by mouth every 6 (six) hours as needed.   rizatriptan (MAXALT-MLT) 10 MG disintegrating tablet, Take 1 tablet (10 mg total) by mouth as needed for migraine. May repeat in  2 hours if needed   Current Outpatient Medications (Other):    cyclobenzaprine (FLEXERIL) 5 MG tablet, Take 1 tablet (5 mg total) by mouth 3 (three) times daily as needed.   Multiple Vitamins-Minerals (MENS MULTI VITAMIN & MINERAL) TABS, Take 1 tablet by mouth daily.   ondansetron (ZOFRAN-ODT) 4 MG disintegrating tablet, Take 1 tablet (4 mg total) by mouth every 8 (eight) hours as needed for nausea or vomiting.   Diabetes (HCC) Lab Results  Component Value Date   HGBA1C 6.6 (H) 01/13/2022   Stable, pt to continue current medical treatment  0 diet, exercise, wt control    Enlarged atria Per ECG , etiology and significance unclear, now for Echo  Chest pain Atypical, exam benign, but for cxr  Midline low back pain with right-sided sciatica CT ls spine sept 2022 with mild degenerative changes only, for f/iu plain film today, refer Sports medicine, and flexeril 5 tid prn  Followup: No follow-ups on file.  Oliver Barre, MD 08/30/2022 12:02 PM [facility]

## 2022-08-30 NOTE — Assessment & Plan Note (Signed)
Exam benign, etiology unclear, for UA with labs

## 2022-08-30 NOTE — Assessment & Plan Note (Signed)
CT ls spine sept 2022 with mild degenerative changes only, for f/iu plain film today, refer Sports medicine, and flexeril 5 tid prn

## 2022-08-30 NOTE — Assessment & Plan Note (Signed)
Lab Results  Component Value Date   LDLCALC 135 (H) 02/15/2017   Uncontrolled, pt to continue current diet and f/u lab today    Current Outpatient Medications (Respiratory):  .  benzonatate (TESSALON) 100 MG capsule, Take 1 capsule (100 mg total) by mouth every 8 (eight) hours. .  Guaifenesin 1200 MG TB12, Take 1 tablet (1,200 mg total) by mouth in the morning and at bedtime.  Current Outpatient Medications (Analgesics):  .  acetaminophen (TYLENOL) 500 MG tablet, Take 2 tablets (1,000 mg total) by mouth every 6 (six) hours as needed. Marland Kitchen  ibuprofen (ADVIL) 600 MG tablet, Take 1 tablet (600 mg total) by mouth every 6 (six) hours as needed. .  rizatriptan (MAXALT-MLT) 10 MG disintegrating tablet, Take 1 tablet (10 mg total) by mouth as needed for migraine. May repeat in 2 hours if needed   Current Outpatient Medications (Other):  .  cyclobenzaprine (FLEXERIL) 5 MG tablet, Take 1 tablet (5 mg total) by mouth 3 (three) times daily as needed. .  Multiple Vitamins-Minerals (MENS MULTI VITAMIN & MINERAL) TABS, Take 1 tablet by mouth daily. .  ondansetron (ZOFRAN-ODT) 4 MG disintegrating tablet, Take 1 tablet (4 mg total) by mouth every 8 (eight) hours as needed for nausea or vomiting.

## 2022-09-06 ENCOUNTER — Encounter: Payer: Self-pay | Admitting: Family Medicine

## 2022-09-07 NOTE — Progress Notes (Unsigned)
    Trevor Wallace D.[place] Phone: [phone]   Assessment and Plan:     There are no diagnoses linked to this encounter.  ***   Pertinent previous records reviewed include ***   Follow Up: ***     Subjective:   I, Trevor Wallace, am serving as a Education administrator for Doctor Glennon Mac  Chief Complaint: back pain   HPI:   09/08/2022 Patient is a 46 year old male complaining of back pain. Patient states   Relevant Historical Information: ***  Additional pertinent review of systems negative.   Current Outpatient Medications:    acetaminophen (TYLENOL) 500 MG tablet, Take 2 tablets (1,000 mg total) by mouth every 6 (six) hours as needed., Disp: 30 tablet, Rfl: 0   benzonatate (TESSALON) 100 MG capsule, Take 1 capsule (100 mg total) by mouth every 8 (eight) hours., Disp: 21 capsule, Rfl: 0   cyclobenzaprine (FLEXERIL) 5 MG tablet, Take 1 tablet (5 mg total) by mouth 3 (three) times daily as needed., Disp: 40 tablet, Rfl: 1   Guaifenesin 1200 MG TB12, Take 1 tablet (1,200 mg total) by mouth in the morning and at bedtime., Disp: 14 tablet, Rfl: 0   ibuprofen (ADVIL) 600 MG tablet, Take 1 tablet (600 mg total) by mouth every 6 (six) hours as needed., Disp: 30 tablet, Rfl: 0   Multiple Vitamins-Minerals (MENS MULTI VITAMIN & MINERAL) TABS, Take 1 tablet by mouth daily., Disp: , Rfl:    ondansetron (ZOFRAN-ODT) 4 MG disintegrating tablet, Take 1 tablet (4 mg total) by mouth every 8 (eight) hours as needed for nausea or vomiting., Disp: 15 tablet, Rfl: 0   rizatriptan (MAXALT-MLT) 10 MG disintegrating tablet, Take 1 tablet (10 mg total) by mouth as needed for migraine. May repeat in 2 hours if needed, Disp: 10 tablet, Rfl: 0   Objective:     There were no vitals filed for this visit.    There is no height or weight on file to calculate BMI.    Physical Exam:    ***   Electronically signed by:  Trevor Wallace [facility] 7:56 AM 09/07/22

## 2022-09-08 ENCOUNTER — Ambulatory Visit: Payer: Commercial Managed Care - HMO | Admitting: Sports Medicine

## 2022-09-08 VITALS — HR 86 | Ht 70.0 in | Wt 207.0 lb

## 2022-09-08 DIAGNOSIS — M9903 Segmental and somatic dysfunction of lumbar region: Secondary | ICD-10-CM | POA: Diagnosis not present

## 2022-09-08 DIAGNOSIS — M9904 Segmental and somatic dysfunction of sacral region: Secondary | ICD-10-CM

## 2022-09-08 DIAGNOSIS — M9905 Segmental and somatic dysfunction of pelvic region: Secondary | ICD-10-CM

## 2022-09-08 DIAGNOSIS — M545 Low back pain, unspecified: Secondary | ICD-10-CM | POA: Diagnosis not present

## 2022-09-08 DIAGNOSIS — G8929 Other chronic pain: Secondary | ICD-10-CM

## 2022-09-08 MED ORDER — MELOXICAM 15 MG PO TABS: 15.0000 mg | ORAL_TABLET | Freq: Every day | ORAL | 0 refills | Status: DC

## 2022-09-08 NOTE — Patient Instructions (Addendum)
Good to see you - Start meloxicam 15 mg daily x2 weeks.  If still having pain after 2 weeks, complete 3rd-week of meloxicam. May use remaining meloxicam as needed once daily for pain control.  Do not to use additional NSAIDs while taking meloxicam.  May use Tylenol [phone] mg 2 to 3 times a day for breakthrough pain. Low back HEP  PT referral  3-4 week follow up MS[facility]

## 2022-10-20 ENCOUNTER — Encounter (HOSPITAL_COMMUNITY): Payer: Self-pay | Admitting: Internal Medicine

## 2022-10-20 ENCOUNTER — Other Ambulatory Visit (HOSPITAL_COMMUNITY): Payer: Commercial Managed Care - HMO

## 2022-11-14 DIAGNOSIS — R079 Chest pain, unspecified: Secondary | ICD-10-CM

## 2022-11-14 HISTORY — DX: Chest pain, unspecified: R07.9

## 2022-12-05 ENCOUNTER — Emergency Department (HOSPITAL_COMMUNITY): Payer: Commercial Managed Care - HMO

## 2022-12-05 ENCOUNTER — Other Ambulatory Visit: Payer: Self-pay

## 2022-12-05 ENCOUNTER — Emergency Department (HOSPITAL_COMMUNITY)
Admission: EM | Admit: 2022-12-05 | Discharge: 2022-12-05 | Discharge status: Against Medical Advice | Payer: Commercial Managed Care - HMO | Attending: Emergency Medicine | Admitting: Emergency Medicine

## 2022-12-05 DIAGNOSIS — E1165 Type 2 diabetes mellitus with hyperglycemia: Secondary | ICD-10-CM | POA: Insufficient documentation

## 2022-12-05 DIAGNOSIS — Z5329 Procedure and treatment not carried out because of patient's decision for other reasons: Secondary | ICD-10-CM | POA: Insufficient documentation

## 2022-12-05 DIAGNOSIS — R7309 Other abnormal glucose: Secondary | ICD-10-CM

## 2022-12-05 DIAGNOSIS — R079 Chest pain, unspecified: Secondary | ICD-10-CM | POA: Diagnosis not present

## 2022-12-05 LAB — BASIC METABOLIC PANEL
Anion gap: 8 (ref 5–15)
BUN: 10 mg/dL (ref 6–20)
CO2: 25 mmol/L (ref 22–32)
Calcium: 8.6 mg/dL — ABNORMAL LOW (ref 8.9–10.3)
Chloride: 104 mmol/L (ref 98–111)
Creatinine, Ser: 0.72 mg/dL (ref 0.61–1.24)
GFR, Estimated: >60 mL/min (ref >60)
Glucose, Bld: 117 mg/dL — ABNORMAL HIGH (ref 70–99)
Potassium: 3.8 mmol/L (ref 3.5–5.1)
Sodium: 137 mmol/L (ref 135–145)

## 2022-12-05 LAB — CBC
HCT: 45 % (ref 39.0–52.0)
Hemoglobin: 13.7 g/dL (ref 13.0–17.0)
MCH: 21.9 pg — ABNORMAL LOW (ref 26.0–34.0)
MCHC: 30.4 g/dL (ref 30.0–36.0)
MCV: 71.8 fL — ABNORMAL LOW (ref 80.0–100.0)
Platelets: 381 10*3/uL (ref 150–400)
RBC: 6.27 MIL/uL — ABNORMAL HIGH (ref 4.22–5.81)
RDW: 17.4 % — ABNORMAL HIGH (ref 11.5–15.5)
WBC: 9.7 10*3/uL (ref 4.0–10.5)
nRBC: 0 % (ref 0.0–0.2)

## 2022-12-05 LAB — TROPONIN I (HIGH SENSITIVITY)
Troponin I (High Sensitivity): 3 ng/L (ref <18)
Troponin I (High Sensitivity): 3 ng/L (ref <18)

## 2022-12-05 LAB — D-DIMER, QUANTITATIVE: D-Dimer, Quant: <0.27 ug/mL-FEU (ref 0.00–0.50)

## 2022-12-05 NOTE — Discharge Instructions (Addendum)
You are leaving against my medical advice because I have not completed your full evaluation.  A blood test has been sent to the lab to screen for blood clots in the lungs.  That test is called a D-dimer.  Please check the results on MyChart.  If it is elevated, please return to the emergency department so you can get a CT scan of the chest to make sure you do not have blood clots in your lungs.

## 2022-12-05 NOTE — ED Triage Notes (Signed)
Presents from home for sharp L CP (nonradiating, started while walking at work, constant now), worse with palpation, SOB, dizzy, HA, cough.  Denies syncope, fever Recent URI Was told to come to the ER 2 months ago for "changes on the EKG" but never came H/o HLD, DM

## 2022-12-05 NOTE — ED Notes (Signed)
Patient stated he was told after this blood was drawn that he could leave. Patient stated he needed to get his kid to school. Patient stated he would wait for the results on mychart and he would come back if they were abnormal. MD glick aware.

## 2022-12-05 NOTE — ED Provider Notes (Addendum)
[facility] Provider Note   CSN: [id number] Arrival date & time: 12/05/22  0159     History  No chief complaint on file.   Trevor Pienta. is a 47 y.o. male.  The history is provided by the patient.  He has history of prediabetes and comes in because of left-sided chest pain which started yesterday afternoon.  Pain is dull and somewhat worse if he takes a deep breath.  He feels slightly short of breath.  He denies nausea, vomiting, diaphoresis.  He denies fever or cough.  He is a non-smoker and denies family history of premature coronary atherosclerosis.   Home Medications Prior to Admission medications   Not on File      Allergies    Metformin and related    Review of Systems   Review of Systems  All other systems reviewed and are negative.   Physical Exam Updated Vital Signs BP 134/82 (BP Location: Right Arm)   Pulse 70   Temp 98.3 F (36.8 C) (Oral)   Resp 18   Wt 94 kg   SpO2 100%   BMI 29.73 kg/m  Physical Exam Vitals and nursing note reviewed.   47 year old male, resting comfortably and in no acute distress. Vital signs are normal. Oxygen saturation is 100%, which is normal. Head is normocephalic and atraumatic. PERRLA, EOMI. Oropharynx is clear. Neck is nontender and supple without adenopathy or JVD. Back is nontender and there is no CVA tenderness. Lungs are clear without rales, wheezes, or rhonchi. Chest is nontender. Heart has regular rate and rhythm without murmur. Abdomen is soft, flat, nontender. Extremities have no cyanosis or edema, full range of motion is present. Skin is warm and dry without rash. Neurologic: Mental status is normal, cranial nerves are intact, moves all extremities equally.  ED Results / Procedures / Treatments   Labs (all labs ordered are listed, but only abnormal results are displayed) Labs Reviewed  BASIC METABOLIC PANEL - Abnormal; Notable for the following components:       Result Value   Glucose, Bld 117 (*)    Calcium 8.6 (*)    All other components within normal limits  CBC - Abnormal; Notable for the following components:   RBC 6.27 (*)    MCV 71.8 (*)    MCH 21.9 (*)    RDW 17.4 (*)    All other components within normal limits  D-DIMER, QUANTITATIVE  TROPONIN I (HIGH SENSITIVITY)  TROPONIN I (HIGH SENSITIVITY)    EKG EKG Interpretation  Date/Time:  Monday December 05 2022 02:06:14 EST Ventricular Rate:  69 PR Interval:  156 QRS Duration: 88 QT Interval:  382 QTC Calculation: 409 R Axis:   50 Text Interpretation: Normal sinus rhythm Septal infarct , age undetermined Abnormal ECG When compared with ECG of 06-Jul-2022 10:20, No significant change was found Confirmed by Delora Fuel ([id number]) on 12/05/2022 5:56:18 AM  Radiology DG Chest 2 View  Result Date: 12/05/2022 CLINICAL DATA:  Chest pain. EXAM: CHEST - 2 VIEW COMPARISON:  08/30/2022. FINDINGS: The heart size and mediastinal contours are within normal limits. There is atherosclerotic calcification of the aorta. No consolidation, effusion, or pneumothorax. No acute osseous abnormality. IMPRESSION: No active cardiopulmonary disease. Electronically Signed   By: Brett Fairy M.D.   On: 12/05/2022 02:44    Procedures Procedures    Medications Ordered in ED Medications - No data to display  ED Course/ Medical Decision Making/ A&P  HEART Score for Major Cardiac Events  ED from 12/05/2022 in [facility] Emergency Department at [facility]   12/05/2022   5003     History Slightly suspicious  ECG Normal  Age 78-64  Risk Factors No known risk factors  Troponin Less than or equal to normal limit  HEART Score 1                            Medical Decision Making Amount and/or Complexity of Data Reviewed Labs: ordered.   Chest pain which is somewhat atypical.  He has no significant risk factors for coronary artery disease.  I have reviewed and interpreted his ECG, and my  interpretation is no ST or T changes.  Chest x-ray shows no active cardiopulmonary disease.  I have independently viewed the images of, and agree with the radiologist's interpretation.  I have reviewed and interpreted his laboratory test, and my interpretation is mild elevation of random glucose consistent with known history of prediabetes, normal CBC, normal troponin x 2.  Heart score is 2, which puts him at low risk for major adverse cardiac events in the next 6 weeks.  However, I am concerned about the pleuritic nature of the pain and I have ordered a D-dimer.  Patient states that he cannot wait in the emergency department as he needs to go pick up a child.  I have advised him that I cannot discharge him since his workup is not complete but he agrees to have D-dimer drawn and will check the result on MyChart.  If positive, he agrees to return to the emergency department for CT angiogram of the chest.  I have reviewed his past records, and he had an office visit on 08/30/2022 for chest pain and an echocardiogram was ordered, he never went for the procedure.  Final Clinical Impression(s) / ED Diagnoses Final diagnoses:  Nonspecific chest pain  Elevated random blood glucose level    Rx / DC Orders ED Discharge Orders     None         Delora Fuel, MD 70/48/88 [phone]  D-dimer is normal.    Delora Fuel, MD 45/03/88 [id number]

## 2022-12-05 NOTE — ED Provider Triage Note (Cosign Needed Addendum)
Emergency Medicine Provider Triage Evaluation Note  Murphy Duzan. , a 47 y.o. male  was evaluated in triage.  Pt complains of a couple years of intermittent CP. States occurs twice a month.  States this time his CP is different somehow today. He can't quite put his finger on why today is different but it is so he came to the ER.  No NV. SOB currently.  Review of Systems  Positive: CP, SOB Negative: Fever   Physical Exam  BP (!) 122/90 (BP Location: Right Arm)   Pulse 69   Temp 98 F (36.7 C)   Resp 16   Wt 94 kg   SpO2 100%   BMI 29.73 kg/m  Gen:   Awake, no distress   Resp:  Normal effort  MSK:   Moves extremities without difficulty  Other:  Speaking in full sentences, soft spoken, pleasant, TTP of L chest  Medical Decision Making  Medically screening exam initiated at 2:25 AM.  Appropriate orders placed.  Philippa Chester. was informed that the remainder of the evaluation will be completed by another provider, this initial triage assessment does not replace that evaluation, and the importance of remaining in the ED until their evaluation is complete.  Labs, EKG, cxr workup started   Tedd Sias, [place] 12/05/22 0228    Tedd Sias, [place] 12/05/22 [phone]

## 2022-12-09 ENCOUNTER — Encounter: Payer: Self-pay | Admitting: Internal Medicine

## 2022-12-20 ENCOUNTER — Ambulatory Visit: Payer: Commercial Managed Care - HMO | Admitting: Internal Medicine

## 2022-12-28 ENCOUNTER — Ambulatory Visit (INDEPENDENT_AMBULATORY_CARE_PROVIDER_SITE_OTHER): Payer: Commercial Managed Care - HMO | Admitting: Internal Medicine

## 2022-12-28 VITALS — BP 120/86 | HR 87 | Temp 97.7°F | Ht 70.0 in | Wt 217.5 lb

## 2022-12-28 DIAGNOSIS — Z125 Encounter for screening for malignant neoplasm of prostate: Secondary | ICD-10-CM

## 2022-12-28 DIAGNOSIS — E7800 Pure hypercholesterolemia, unspecified: Secondary | ICD-10-CM

## 2022-12-28 DIAGNOSIS — R109 Unspecified abdominal pain: Secondary | ICD-10-CM

## 2022-12-28 DIAGNOSIS — E538 Deficiency of other specified B group vitamins: Secondary | ICD-10-CM | POA: Diagnosis not present

## 2022-12-28 DIAGNOSIS — Z0001 Encounter for general adult medical examination with abnormal findings: Secondary | ICD-10-CM | POA: Diagnosis not present

## 2022-12-28 DIAGNOSIS — R079 Chest pain, unspecified: Secondary | ICD-10-CM | POA: Diagnosis not present

## 2022-12-28 DIAGNOSIS — G959 Disease of spinal cord, unspecified: Secondary | ICD-10-CM

## 2022-12-28 DIAGNOSIS — E559 Vitamin D deficiency, unspecified: Secondary | ICD-10-CM | POA: Diagnosis not present

## 2022-12-28 DIAGNOSIS — M47812 Spondylosis without myelopathy or radiculopathy, cervical region: Secondary | ICD-10-CM

## 2022-12-28 DIAGNOSIS — E1165 Type 2 diabetes mellitus with hyperglycemia: Secondary | ICD-10-CM

## 2022-12-28 LAB — VITAMIN B12: Vitamin B-12: 247 pg/mL (ref 211–911)

## 2022-12-28 LAB — LIPID PANEL
Cholesterol: 235 mg/dL — ABNORMAL HIGH (ref 0–200)
HDL: 41.2 mg/dL (ref >39.00)
Total CHOL/HDL Ratio: 6
Triglycerides: 507 mg/dL — ABNORMAL HIGH (ref 0.0–149.0)

## 2022-12-28 LAB — BASIC METABOLIC PANEL
BUN: 11 mg/dL (ref 6–23)
CO2: 28 mEq/L (ref 19–32)
Calcium: 8.8 mg/dL (ref 8.4–10.5)
Chloride: 105 mEq/L (ref 96–112)
Creatinine, Ser: 0.71 mg/dL (ref 0.40–1.50)
GFR: 109.46 mL/min (ref >60.00)
Glucose, Bld: 99 mg/dL (ref 70–99)
Potassium: 3.9 mEq/L (ref 3.5–5.1)
Sodium: 141 mEq/L (ref 135–145)

## 2022-12-28 LAB — CBC WITH DIFFERENTIAL/PLATELET
Basophils Absolute: 0.1 10*3/uL (ref 0.0–0.1)
Basophils Relative: 0.6 % (ref 0.0–3.0)
Eosinophils Absolute: 0.2 10*3/uL (ref 0.0–0.7)
Eosinophils Relative: 1.6 % (ref 0.0–5.0)
HCT: 42.6 % (ref 39.0–52.0)
Hemoglobin: 13.3 g/dL (ref 13.0–17.0)
Lymphocytes Relative: 23.3 % (ref 12.0–46.0)
Lymphs Abs: 2.6 10*3/uL (ref 0.7–4.0)
MCHC: 31.3 g/dL (ref 30.0–36.0)
MCV: 70.5 fl — ABNORMAL LOW (ref 78.0–100.0)
Monocytes Absolute: 1.2 10*3/uL — ABNORMAL HIGH (ref 0.1–1.0)
Monocytes Relative: 10.4 % (ref 3.0–12.0)
Neutro Abs: 7.3 10*3/uL (ref 1.4–7.7)
Neutrophils Relative %: 64.1 % (ref 43.0–77.0)
Platelets: 382 10*3/uL (ref 150.0–400.0)
RBC: 6.04 Mil/uL — ABNORMAL HIGH (ref 4.22–5.81)
RDW: 16.1 % — ABNORMAL HIGH (ref 11.5–15.5)
WBC: 11.4 10*3/uL — ABNORMAL HIGH (ref 4.0–10.5)

## 2022-12-28 LAB — TSH: TSH: 0.97 u[IU]/mL (ref 0.35–5.50)

## 2022-12-28 LAB — VITAMIN D 25 HYDROXY (VIT D DEFICIENCY, FRACTURES): VITD: 8.33 ng/mL — ABNORMAL LOW (ref 30.00–100.00)

## 2022-12-28 LAB — HEPATIC FUNCTION PANEL
ALT: 26 U/L (ref 0–53)
AST: 21 U/L (ref 0–37)
Albumin: 3.8 g/dL (ref 3.5–5.2)
Alkaline Phosphatase: 81 U/L (ref 39–117)
Bilirubin, Direct: 0.1 mg/dL (ref 0.0–0.3)
Total Bilirubin: 0.3 mg/dL (ref 0.2–1.2)
Total Protein: 6.1 g/dL (ref 6.0–8.3)

## 2022-12-28 LAB — PSA: PSA: 0.34 ng/mL (ref 0.10–4.00)

## 2022-12-28 LAB — HEMOGLOBIN A1C: Hgb A1c MFr Bld: 6.6 % — ABNORMAL HIGH (ref 4.6–6.5)

## 2022-12-28 LAB — LDL CHOLESTEROL, DIRECT: Direct LDL: 144 mg/dL

## 2022-12-28 MED ORDER — GABAPENTIN 100 MG PO CAPS: 100.0000 mg | ORAL_CAPSULE | Freq: Three times a day (TID) | ORAL | 5 refills | Status: DC
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
## 2022-12-28 NOTE — Patient Instructions (Signed)
Please take all new medication as prescribed - the gabapentin for nerve pain (and call in 1-2 wks if you need a higher dose)  Please continue all other medications as before, and refills have been done if requested.  Please have the pharmacy call with any other refills you may need.  Please continue your efforts at being more active, low cholesterol diet, and weight control.  You are otherwise up to date with prevention measures today.  Please keep your appointments with your specialists as you may have planned  You will be contacted regarding the referral for: Cardiology, Physical Therapy, Orthopedic  Please go to the LAB at the blood drawing area for the tests to be done  You will be contacted by phone if any changes need to be made immediately.  Otherwise, you will receive a letter about your results with an explanation, but please check with MyChart first.  Please remember to sign up for MyChart if you have not done so, as this will be important to you in the future with finding out test results, communicating by private email, and scheduling acute appointments online when needed.  Please make an Appointment to return in 6 months, or sooner if needed

## 2022-12-31 ENCOUNTER — Encounter: Payer: Self-pay | Admitting: Internal Medicine

## 2022-12-31 NOTE — Assessment & Plan Note (Signed)
Lab Results  Component Value Date   HGBA1C 6.6 (H) 12/28/2022  Mild uncontrolled, pt to continue current medical treatment  - diet, wt control, declines OHA
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
## 2022-12-31 NOTE — Assessment & Plan Note (Signed)
Lab Results  Component Value Date   LDLCALC 135 (H) 02/15/2017   Uncontrolled, goal ldl < 70, pt to continue current low chol DM diet, and f/u lipids today

## 2022-12-31 NOTE — Assessment & Plan Note (Signed)
Atypical,, declines ecg today, for card referrraal per pt reqeust

## 2022-12-31 NOTE — Assessment & Plan Note (Signed)
Age and sex appropriate education and counseling updated with regular exercise and diet Referrals for preventative services - pt will call for eye exam soon, declines hep c screen Immunizations addressed - declines covid booster, flu shot Smoking counseling  - none needed Evidence for depression or other mood disorder - chronic stable anxiety Most recent labs reviewed. I have personally reviewed and have noted: 1) the patient's medical and social history 2) The patient's current medications and supplements 3) The patient's height, weight, and BMI have been recorded in the chart

## 2022-12-31 NOTE — Assessment & Plan Note (Signed)
Last vitamin D Lab Results  Component Value Date   VD25OH 8.33 (L) 12/28/2022   Low, to start oral replacement

## 2023-01-10 ENCOUNTER — Ambulatory Visit: Payer: Commercial Managed Care - HMO | Admitting: Orthopaedic Surgery

## 2023-01-31 ENCOUNTER — Encounter: Payer: Self-pay | Admitting: Physical Therapy

## 2023-01-31 ENCOUNTER — Other Ambulatory Visit: Payer: Self-pay

## 2023-01-31 ENCOUNTER — Ambulatory Visit: Payer: Commercial Managed Care - HMO | Attending: Internal Medicine | Admitting: Physical Therapy

## 2023-01-31 DIAGNOSIS — M6281 Muscle weakness (generalized): Secondary | ICD-10-CM | POA: Diagnosis present

## 2023-01-31 DIAGNOSIS — M62838 Other muscle spasm: Secondary | ICD-10-CM | POA: Diagnosis present

## 2023-01-31 DIAGNOSIS — M542 Cervicalgia: Secondary | ICD-10-CM | POA: Diagnosis not present

## 2023-01-31 DIAGNOSIS — M47812 Spondylosis without myelopathy or radiculopathy, cervical region: Secondary | ICD-10-CM | POA: Insufficient documentation
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
## 2023-02-06 ENCOUNTER — Telehealth: Payer: Self-pay | Admitting: Internal Medicine

## 2023-02-06 DIAGNOSIS — M545 Low back pain, unspecified: Secondary | ICD-10-CM

## 2023-02-06 NOTE — Telephone Encounter (Signed)
Ok this is done 

## 2023-02-06 NOTE — Telephone Encounter (Signed)
Pt called wanted to know if he can add his back  for physical therapy currently going for his neck but he is also having issues with his back and would like that to be added to his referral.  please call pt with update

## 2023-02-06 NOTE — Telephone Encounter (Signed)
Called pt no answer LMOM w/MD response../lmb 

## 2023-02-09 ENCOUNTER — Ambulatory Visit: Payer: Commercial Managed Care - HMO | Attending: Cardiovascular Disease | Admitting: Cardiovascular Disease

## 2023-02-09 ENCOUNTER — Encounter: Payer: Self-pay | Admitting: Cardiovascular Disease

## 2023-02-09 VITALS — BP 116/73 | HR 86 | Ht 70.0 in | Wt 225.0 lb

## 2023-02-09 DIAGNOSIS — R072 Precordial pain: Secondary | ICD-10-CM

## 2023-02-09 MED ORDER — METOPROLOL TARTRATE 100 MG PO TABS: ORAL_TABLET | ORAL | 0 refills | Status: DC

## 2023-02-09 NOTE — Patient Instructions (Addendum)
Medication Instructions:  Your physician recommends that you continue on your current medications as directed. Please refer to the Current Medication list given to you today.  *If you need a refill on your cardiac medications before your next appointment, please call your pharmacy*  Lab Work: Your physician recommends that you have lab work today- BMET  If you have labs (blood work) drawn today and your tests are completely normal, you will receive your results only by: MyChart Message (if you have MyChart) OR A paper copy in the mail If you have any lab test that is abnormal or we need to change your treatment, we will call you to review the results.  Testing/Procedures: Your physician has requested that you have cardiac CT. Cardiac computed tomography (CT) is a painless test that uses an x-ray machine to take clear, detailed pictures of your heart. For further information please visit [email or web address]. Please follow instruction sheet as given.  Follow-Up: At [facility], you and your health needs are our priority.  As part of our continuing mission to provide you with exceptional heart care, we have created designated Provider Care Teams.  These Care Teams include your primary Cardiologist (physician) and Advanced Practice Providers (APPs -  Physician Assistants and Nurse Practitioners) who all work together to provide you with the care you need, when you need it.  We recommend signing up for the patient portal called "MyChart".  Sign up information is provided on this After Visit Summary.  MyChart is used to connect with patients for Virtual Visits (Telemedicine).  Patients are able to view lab/test results, encounter notes, upcoming appointments, etc.  Non-urgent messages can be sent to your provider as well.   To learn more about what you can do with MyChart, go to [email or web address].    Your next appointment:   As needed  Provider:   Dr. Johnsie Cancel    Other  Instructions   Your cardiac CT will be scheduled at one of the below locations:   [facility] [address] [place], [place] [address] [phone]  If scheduled at [facility], please arrive at the [facility] and Children's Entrance (Entrance C2) of [facility] 30 minutes prior to test start time. You can use the FREE valet parking offered at entrance C (encouraged to control the heart rate for the test)  Proceed to the [facility] Radiology Department (first floor) to check-in and test prep.  All radiology patients and guests should use entrance C2 at [facility], accessed from [facility], even though the hospital's physical address listed is [address]     Please follow these instructions carefully (unless otherwise directed):  Hold all erectile dysfunction medications at least 3 days (72 hrs) prior to test. (Ie viagra, cialis, sildenafil, tadalafil, etc) We will administer nitroglycerin during this exam.   On the Night Before the Test: Be sure to Drink plenty of water. Do not consume any caffeinated/decaffeinated beverages or chocolate 12 hours prior to your test. Do not take any antihistamines 12 hours prior to your test.  On the Day of the Test: Drink plenty of water until 1 hour prior to the test. Do not eat any food 1 hour prior to test. You may take your regular medications prior to the test.  Take metoprolol 100 mg (Lopressor) two hours prior to test.  After the Test: Drink plenty of water. After receiving IV contrast, you may experience a mild flushed feeling. This is normal.  On occasion, you may experience a mild rash up to 24 hours after the test. This is not dangerous. If this occurs, you can take Benadryl 25 mg and increase your fluid intake. If you experience trouble breathing, this can be serious. If it is severe call 911 IMMEDIATELY. If it is mild, please call our office.  We will call to schedule  your test 2-4 weeks out understanding that some insurance companies will need an authorization prior to the service being performed.   For non-scheduling related questions, please contact the cardiac imaging nurse navigator should you have any questions/concerns: Marchia Bond, Cardiac Imaging Nurse Navigator Gordy Clement, Cardiac Imaging Nurse Navigator [facility] Direct Office Dial: [phone]   For scheduling needs, including cancellations and rescheduling, please call [place], [phone].

## 2023-02-10 LAB — BASIC METABOLIC PANEL
BUN/Creatinine Ratio: 20 (ref 9–20)
BUN: 13 mg/dL (ref 6–24)
CO2: 18 mmol/L — ABNORMAL LOW (ref 20–29)
Calcium: 8.7 mg/dL (ref 8.7–10.2)
Chloride: 109 mmol/L — ABNORMAL HIGH (ref 96–106)
Creatinine, Ser: 0.64 mg/dL — ABNORMAL LOW (ref 0.76–1.27)
Glucose: 104 mg/dL — ABNORMAL HIGH (ref 70–99)
Potassium: 4.2 mmol/L (ref 3.5–5.2)
Sodium: 143 mmol/L (ref 134–144)
eGFR: 118 mL/min/{1.73_m2} (ref >59)

## 2023-02-15 ENCOUNTER — Encounter: Payer: Self-pay | Admitting: Physical Therapy

## 2023-02-15 ENCOUNTER — Ambulatory Visit: Payer: Commercial Managed Care - HMO | Attending: Internal Medicine | Admitting: Physical Therapy

## 2023-02-15 DIAGNOSIS — M542 Cervicalgia: Secondary | ICD-10-CM | POA: Diagnosis present

## 2023-02-15 DIAGNOSIS — M6281 Muscle weakness (generalized): Secondary | ICD-10-CM | POA: Insufficient documentation

## 2023-02-15 DIAGNOSIS — M62838 Other muscle spasm: Secondary | ICD-10-CM | POA: Diagnosis present

## 2023-02-15 DIAGNOSIS — M5459 Other low back pain: Secondary | ICD-10-CM | POA: Insufficient documentation

## 2023-02-15 NOTE — Therapy (Signed)
OUTPATIENT PHYSICAL THERAPY TREATMENT NOTE   Patient Name: Trevor Wallace. MRN: [id number] DOB:1976-09-04, 47 y.o., male Today's Date: 02/15/2023  PCP: Biagio Borg, MD   REFERRING PROVIDER: Biagio Borg, MD  END OF SESSION:   PT End of Session - 02/15/23 1147     Visit Number 2    Number of Visits 13    Date for PT Re-Evaluation 03/28/23    Authorization Type cigna    PT Start Time 1146    PT Stop Time 1238    PT Time Calculation (min) 52 min             Past Medical History:  Diagnosis Date   Chest pain    Prediabetes    History reviewed. No pertinent surgical history. 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   Chest pain 08/30/2022   Midline low back pain with right-sided sciatica 08/30/2022   Abdominal pain 01/16/2022   Right sided abdominal pain 01/16/2022   Right shoulder pain 10/15/2021   Vitamin D deficiency 08/13/2021   HLD (hyperlipidemia) 08/13/2021   Migraine 08/13/2021   Cervical myelopathy 06/09/2021   Gait disorder 06/09/2021   RLS (restless legs syndrome) 06/09/2021   COVID-19 virus infection 10/27/2020   Urinary frequency 04/10/2020   Elevated LFTs 09/27/2019   Encounter for well adult exam with abnormal findings 02/15/2017   Former smoker 02/15/2017   Dysuria 02/15/2017   Diabetes     REFERRING DIAG: Cervical spondylosis [id number]   THERAPY DIAG:  Cervicalgia  Other muscle spasm  Rationale for Evaluation and Treatment Rehabilitation  PERTINENT HISTORY:  DM, Cervical Myelopathy,   PRECAUTIONS: none  SUBJECTIVE:                                                                                                                                                                                      SUBJECTIVE STATEMENT:  I got a new pain after turning my head. Its on the right, top of the shoulder. I saw the doctor who was sending a referral for my back too. I have trouble standing up  straight.    PAIN:  Are you having pain? Yes: NPRS scale: 8/10  Pain location: posterior neck, down back, and referred symptoms into the front of thighs Pain description: constant, intermittent sharp pain, Aggravating factors: looking down,  Relieving factors: leaning forward, holding head up with hands.    OBJECTIVE: (objective measures completed at initial evaluation unless otherwise dated)   DIAGNOSTIC FINDINGS:  N/A   PATIENT SURVEYS:  FOTO 36% predicted 56%   COGNITION: Overall cognitive status: Within functional limits for tasks assessed  SENSATION: WFL   POSTURE: rounded shoulders and forward head   PALPATION: TTP along bil upper trap, levator scapulae, and cervical paraspinals with multiple trigger points noted.           CERVICAL ROM:    Active ROM A/PROM (deg) eval  Flexion 30   Extension 12  Right lateral flexion 20  Left lateral flexion 10  Right rotation 38  Left rotation 36   (Blank rows = not tested) (*= concordant pain)   UPPER EXTREMITY ROM:   Active ROM Right eval Left eval  Shoulder flexion [facility] WFL  Shoulder extension [facility]  Shoulder abduction [facility]  Shoulder adduction      Shoulder extension      Shoulder internal rotation [facility] WFL  Shoulder external rotation [facility] WFL  Elbow flexion      Elbow extension      Wrist flexion      Wrist extension      Wrist ulnar deviation      Wrist radial deviation      Wrist pronation      Wrist supination       (Blank rows = not tested)   UPPER EXTREMITY MMT:   MMT Right eval Left eval  Shoulder flexion 4 4-  Shoulder extension 4+ 4+  Shoulder abduction 4 4  Shoulder adduction      Shoulder internal rotation 4+ 4+  Shoulder external rotation 4+ 4-  Middle trapezius      Lower trapezius      Elbow flexion      Elbow extension      Wrist flexion      Wrist extension      Wrist ulnar deviation      Wrist radial deviation      Wrist pronation      Wrist supination      Grip  strength       (Blank rows = not tested)   CERVICAL SPECIAL TESTS:  Neck flexor muscle endurance test: Positive, Upper limb tension test (ULTT): Negative, Spurling's test: Negative, and Distraction test: Positive     DNF test able to hold for 29 seconds before fatiguing   FUNCTIONAL TESTS:      TODAY'S TREATMENT:                                                                                                                              [facility] Adult PT Treatment:                                                DATE: 02/15/23 Therapeutic Exercise: Seated chin tuck Seated scap retract Seated upper trap stretch Seated levator stretch Pec stretch in doorway  Open books x 5 each way  Chin tuck over towel 5 sec x 10 Supine dowel  pullovers- x 10 Manual Therapy: STW cervical paraspinals and sub occipital release Modalities:  HMP to cervical x 10 minutes    [facility] Adult PT Treatment:                                                DATE: 01/31/2023 Therapeutic Exercise: Upper trap stretch 1 x 30 sec Seated chin tuck 1 x 10 holding 5 seconds- tactile cue for proper form  Scapular retraction 1 x 10 cues to avoid neck flexion Cervical isometrics for rotation/ sidebending 1 x 10 holding 5 seconds Manual Therapy: MTPR along the R upper trap.  Self Care: Avoiding shoulder hiking and every time he feels he wants to hike/ roll his shoulders to stop and focus on the stretch instead. Impact of over activation of the upper trapezius has on the neck/ shoulder.      PATIENT EDUCATION:  Education details: evaluation findings, POC, goals, HEP with proper form/ rationale Person educated: Patient Education method: Explanation, Verbal cues, and Handouts Education comprehension: verbalized understanding   HOME EXERCISE PROGRAM: Access Code: [email or web address] URL: [email or web address] Date: 01/31/2023 Prepared by: Starr Lake   Exercises - Seated Upper Trapezius Stretch  - 1 x daily - 7 x  weekly - 2 sets - 2 reps - 30 seconds hold - Standing Cervical Retraction  - 1 x daily - 7 x weekly - 2 sets - 10 reps - 5 seconds hold - Supine Chin Tuck  - 1 x daily - 7 x weekly - 2 sets - 10 reps - 5 seconds hold - Seated Scapular Retraction  - 1 x daily - 7 x weekly - 2 sets - 10 reps - 5 seconds hold - Standing Isometric Cervical Rotation  - 1 x daily - 7 x weekly - 2 sets - 10 reps - Seated Isometric Cervical Sidebending  - 1 x daily - 7 x weekly - 2 sets - 10 reps   ASSESSMENT:   CLINICAL IMPRESSION: Patient is a 47 y.o. M who was seen today for physical therapy treatment for referral dx of cervical spondylosis. Pt reports 8/10 pain on arrival. He has a new referral for low back and will be assessed for that at next visit. Reviewed HEP and progressed with thoracic and pec stretches. He c/o of pain in right shoulder intermittently. Manual STW performed to posterior neck to assist in pain reduction. Discussed options for home heating pads, home sub occipital release.  He would benefit from physical therapy to decrease neck pain and muscle tension, promote shoulder strength and maximize his function by addressing the deficits listed. Visually his cervical AROM is improved.    OBJECTIVE IMPAIRMENTS: decreased activity tolerance, decreased endurance, decreased ROM, decreased strength, increased fascial restrictions, increased muscle spasms, improper body mechanics, postural dysfunction, and pain.    ACTIVITY LIMITATIONS: carrying, lifting, reach over head, and weight lifting/ sporting activities    PARTICIPATION LIMITATIONS: community activity, occupation, and yard work   PERSONAL FACTORS: Time since onset of injury/illness/exacerbation and 1-2 comorbidities: DM, Cervical Myelopathy,   are also affecting patient's functional outcome.    REHAB POTENTIAL: Good   CLINICAL DECISION MAKING: Evolving/moderate complexity   EVALUATION COMPLEXITY: Moderate     GOALS: Goals reviewed with  patient? Yes   SHORT TERM GOALS: Target date: 02/21/2023   Pt to be IND with initial HEP for therapeutic progression Baseline:  Goal status: INITIAL   2.  Reduce upper trap and surrounding muscle spasm to promote cervical ROM and reduce pain to </= 6/10 pain at max Baseline:  Goal status: INITIAL   3.  Increase DNF hold to >/= 45 seconds to demonstrate improvement in DNF strength/ endurance Baseline:  Goal status: INITIAL   4.  Pt to be able to verbalize/ demo efficient posture and lifting mechanics to reduce and prevent neck pain.  Baseline:  Goal status: INITIAL     LONG TERM GOALS: Target date: 03/14/2023   Increase cervical extension/ rotation and side bending by >/= 10 degrees in all planes to promote functional ROM required for ADLS and safety with driving with </= [id number] max pain Baseline:  Goal status: INITIAL   2.  Increase bil UE gross strength to >/= 4+/5 to assist with ADLs, and lifting items from an overhead shelf with </= min difficulty Baseline:  Goal status: INITIAL   3.  Increase DNF endurance to >/= 60 seconds for cervical strength/ endurance Baseline:  Goal status: INITIAL   4.  Pt to be able to return to self exercise program per his personal goals.  Baseline:  Goal status: INITIAL   5.  Increaes FOTO score to >/= 56% to demo improvement in overall function Baseline: initial score 36% Goal status: INITIAL   6.  Pt to be IND with all HEP and is able to maintain and progress their current LOF IND.  Baseline:  Goal status: INITIAL     PLAN:   PT FREQUENCY: 1-2x/week   PT DURATION: 8 weeks   PLANNED INTERVENTIONS: Therapeutic exercises, Therapeutic activity, Neuromuscular re-education, Balance training, Gait training, Patient/Family education, Self Care, Joint mobilization, Joint manipulation, Dry Needling, Spinal mobilization, Cryotherapy, Moist heat, Taping, Traction, Ultrasound, Ionotophoresis 4mg /ml Dexamethasone, Manual therapy, and  Re-evaluation   PLAN FOR NEXT SESSION: review/ update HEP PRN. STW along upper trap/ levator/ cervical paraspinals/ sub-occipitals. Cervical mobs. Posture education. Posterior chain activation, upper trap/ pec stretching.   Hessie Diener, PTA 02/15/23 12:47 PM Phone: [phone] Fax: [phone]

## 2023-02-16 ENCOUNTER — Ambulatory Visit: Payer: Commercial Managed Care - HMO | Admitting: Physical Therapy

## 2023-02-16 DIAGNOSIS — M542 Cervicalgia: Secondary | ICD-10-CM

## 2023-02-16 DIAGNOSIS — M6281 Muscle weakness (generalized): Secondary | ICD-10-CM

## 2023-02-16 DIAGNOSIS — M62838 Other muscle spasm: Secondary | ICD-10-CM

## 2023-02-16 DIAGNOSIS — M5459 Other low back pain: Secondary | ICD-10-CM
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
## 2023-02-21 ENCOUNTER — Ambulatory Visit: Payer: Commercial Managed Care - HMO | Admitting: Physical Therapy

## 2023-02-21 DIAGNOSIS — M5459 Other low back pain: Secondary | ICD-10-CM

## 2023-02-21 DIAGNOSIS — M62838 Other muscle spasm: Secondary | ICD-10-CM

## 2023-02-21 DIAGNOSIS — M542 Cervicalgia: Secondary | ICD-10-CM

## 2023-02-21 DIAGNOSIS — M6281 Muscle weakness (generalized): Secondary | ICD-10-CM

## 2023-02-21 NOTE — Therapy (Signed)
OUTPATIENT PHYSICAL THERAPY TREATMENT NOTE    Patient Name: Trevor Wallace. MRN: [id number] DOB:[birth year], 47 y.o., male Today's Date: 02/21/2023  PCP: Corwin Levins, MD   REFERRING PROVIDER: Corwin Levins, MD  END OF SESSION:   PT End of Session - 02/21/23 0928     Visit Number 4    Number of Visits 13    Date for PT Re-Evaluation 03/28/23    Authorization Type cigna    PT Start Time [phone]    PT Stop Time 1008    PT Time Calculation (min) 41 min    Activity Tolerance Patient tolerated treatment well;No increased pain    Behavior During Therapy WFL for tasks assessed/performed               Past Medical History:  Diagnosis Date   Chest pain    Prediabetes    No past surgical history on file. 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   Chest pain 08/30/2022   Midline low back pain with right-sided sciatica 08/30/2022   Abdominal pain 01/16/2022   Right sided abdominal pain 01/16/2022   Right shoulder pain 10/15/2021   Vitamin D deficiency 08/13/2021   HLD (hyperlipidemia) 08/13/2021   Migraine 08/13/2021   Cervical myelopathy 06/09/2021   Gait disorder 06/09/2021   RLS (restless legs syndrome) 06/09/2021   COVID-19 virus infection 10/27/2020   Urinary frequency 04/10/2020   Elevated LFTs 09/27/2019   Encounter for well adult exam with abnormal findings 02/15/2017   Former smoker 02/15/2017   Dysuria 02/15/2017   Diabetes     REFERRING DIAG: Cervical spondylosis [I20.355]      Midline low back pain without sciatica, unspecified chronicity [M54.50]  (added on 02/16/23)  THERAPY DIAG:  Other muscle spasm  Muscle weakness (generalized)  Other low back pain  Cervicalgia  Rationale for Evaluation and Treatment Rehabilitation  PERTINENT HISTORY:  DM, Cervical Myelopathy,   PRECAUTIONS: none  SUBJECTIVE:                                                                                                                                                                                       SUBJECTIVE STATEMENT:   "The right side of the neck is giving me more issues."   PAIN:   Back Are you having pain? Yes: NPRS scale: 10/10 (continues denies needing to go to the ED) Pain location: across the whole back , with increased in the middle Pain description: soreness, tightness Aggravating factors: constant movement, standing Relieving factors: sitting and resting the back against something hard.    Neck Are you having pain? Yes:  NPRS scale: 9/10  Pain location: posterior neck, down back, and referred symptoms into the front of thighs Pain description: constant, intermittent sharp pain, Aggravating factors: looking down,  Relieving factors: leaning forward, holding head up with hands.    OBJECTIVE: (objective measures completed at initial evaluation unless otherwise dated)   DIAGNOSTIC FINDINGS:  N/A   PATIENT SURVEYS:  FOTO 36% predicted 56%   COGNITION: Overall cognitive status: Within functional limits for tasks assessed   SENSATION: WFL   POSTURE: rounded shoulders and forward head   PALPATION: TTP along bil upper trap, levator scapulae, and cervical paraspinals with multiple trigger points noted.    02/16/23 Back:   TTP along bil lumbar parapsinals with R>L and soreness located at bil SIJ with R>L.   CERVICAL ROM:    Active ROM A/PROM (deg) eval  Flexion 30   Extension 12  Right lateral flexion 20  Left lateral flexion 10  Right rotation 38  Left rotation 36   (Blank rows = not tested) (*= concordant pain) LUMBAR ROM:   Active  02/16/2023  Flexion 40  Extension 20  Right lateral flexion 10  Left lateral flexion 10  Right rotation   Left rotation    (Blank rows = not tested)  UPPER EXTREMITY ROM:   Active ROM Right eval Left eval  Shoulder flexion [facility]  Shoulder extension [facility]  Shoulder abduction [facility]  Shoulder adduction      Shoulder extension       Shoulder internal rotation [facility] WFL  Shoulder external rotation [facility] WFL  Elbow flexion      Elbow extension      Wrist flexion      Wrist extension      Wrist ulnar deviation      Wrist radial deviation      Wrist pronation      Wrist supination       (Blank rows = not tested)  UPPER EXTREMITY MMT:   MMT Right eval Left eval  Shoulder flexion 4 4-  Shoulder extension 4+ 4+  Shoulder abduction 4 4  Shoulder adduction      Shoulder internal rotation 4+ 4+  Shoulder external rotation 4+ 4-  Middle trapezius      Lower trapezius      Elbow flexion      Elbow extension      Wrist flexion      Wrist extension      Wrist ulnar deviation      Wrist radial deviation      Wrist pronation      Wrist supination      Grip strength       (Blank rows = not tested) LOWER EXTREMITY MMT:    MMT Right 02/16/23 Left 02/16/23  Hip flexion 4 4  Hip extension 4+ 4+  Hip abduction 4 4  Hip adduction    Hip internal rotation    Hip external rotation    Knee flexion    Knee extension    Ankle dorsiflexion    Ankle plantarflexion    Ankle inversion    Ankle eversion     (Blank rows = not tested)   CERVICAL SPECIAL TESTS:  Neck flexor muscle endurance test: Positive, Upper limb tension test (ULTT): Negative, Spurling's test: Negative, and Distraction test: Positive     DNF test able to hold for 29 seconds before fatiguing  LUMBAR SPECIAL TESTS:  SI Compression/distraction test: R, Gaenslen's test: R, and Forward flexion with  decreased superior SIJ movement  on the R compared bil     FUNCTIONAL TESTS:      TODAY'S TREATMENT:                                                                                                                              [facility] Adult PT Treatment:                                                DATE: 02/21/23 Therapeutic Exercise: Nu-step L5 x 5 min UE/LE (verbal cues to focus on the motion/ movement instead of pain) R upper trap stretch 1 x 30 sec Supine  chin tuck 1 x 10 holding 10 seconds R hamstring stretch 2 x 30 sec PNF contract/ relax SLR 2 x 12 RLE LTR 1 x 10 Posterior pelvic tilt 1 x 10 holding 10 second each - Tactile cues  Manual Therapy: MTPR along the R upper trap/ R lumbar paraspinals.  How to use theracane to assist with MTPR at home and where, if he was interested, it can be purchased. LAD RLE grade V with cavitation noted    [facility] Adult PT Treatment:                                                DATE: 02/16/23 Therapeutic Exercise: Hamstring PNF stretch 2 x 30 sec on the R with 10 second hold Seated hamstring stretch 2 x 30 sec on R SLR 3 x 10 bil  LTR 1 x 20  Updated HEP for low back   [facility]                                                [facility]: 02/15/23 Therapeutic Exercise: Seated chin tuck Seated scap retract Seated upper trap stretch Seated levator stretch Pec stretch in doorway  Open books x 5 each way  Chin tuck over towel 5 sec x 10 Supine dowel pullovers- x 10 Manual Therapy: STW cervical paraspinals and sub occipital release Modalities:  HMP to cervical x 10 minutes      PATIENT EDUCATION:  Education details: evaluation findings, POC, goals, HEP with proper form/ rationale Person educated: Patient Education method: Explanation, Verbal cues, and Handouts Education comprehension: verbalized understanding   HOME EXERCISE PROGRAM: Access Code: [email or web address] URL: [email or web address] Date: 01/31/2023 Prepared by: Lulu RidingKristoffer Javani Spratt   Exercises for Neck - Seated Upper Trapezius Stretch  - 1 x daily - 7 x weekly - 2 sets - 2 reps - 30 seconds hold - Standing Cervical  Retraction  - 1 x daily - 7 x weekly - 2 sets - 10 reps - 5 seconds hold - Supine Chin Tuck  - 1 x daily - 7 x weekly - 2 sets - 10 reps - 5 seconds hold - Seated Scapular Retraction  - 1 x daily - 7 x weekly - 2 sets - 10 reps - 5 seconds hold - Standing Isometric Cervical Rotation  - 1 x daily - 7 x weekly -  2 sets - 10 reps - Seated Isometric Cervical Sidebending  - 1 x daily - 7 x weekly - 2 sets - 10 reps  Exercises for Back Access Code: [email or web address] URL: [email or web address] Date: 02/16/2023 Prepared by: Lulu Riding   - Lower Trunk Rotation  - 1 x daily - 7 x weekly - 2 sets - 20 reps - Supine Hamstring Stretch with Strap  - 1 x daily - 7 x weekly - 3 sets - 2 reps - 30 hold - Seated Hamstring Stretch  - 1 x daily - 7 x weekly - 2 sets - 2 reps - 30 hold - SLR (Mirrored)  - 1 x daily - 7 x weekly - 3 sets - 12 reps - 1 hold   ASSESSMENT:   CLINICAL IMPRESSION: Pt arrives to session reporting continued pain in the neck rated at 9/10 and back pain rated at 10/10, continued reviewing the pain scale which pt continues to report that level of pain and denies needing to go to the ED.  Time was take for MTPR focusing on the R upper trap, and reviewed tools that can assist with treatment. Continued addressing R SIJ with hamstring stretching and hip flexor activation which he does fatigue quickly with. For neck worked on DNF activation.   OBJECTIVE IMPAIRMENTS: decreased activity tolerance, decreased endurance, decreased ROM, decreased strength, increased fascial restrictions, increased muscle spasms, improper body mechanics, postural dysfunction, and pain.    ACTIVITY LIMITATIONS: carrying, lifting, reach over head, and weight lifting/ sporting activities    PARTICIPATION LIMITATIONS: community activity, occupation, and yard work   PERSONAL FACTORS: Time since onset of injury/illness/exacerbation and 1-2 comorbidities: DM, Cervical Myelopathy,   are also affecting patient's functional outcome.    REHAB POTENTIAL: Good   CLINICAL DECISION MAKING: Evolving/moderate complexity   EVALUATION COMPLEXITY: Moderate     GOALS: Goals reviewed with patient? Yes   SHORT TERM GOALS: Target date: 02/21/2023   Pt to be IND with initial HEP for therapeutic progression Baseline:  Goal  status: met 02/21/2023   2.  Reduce upper trap and surrounding muscle spasm to promote cervical ROM and reduce pain to </= 6/10 pain at max Baseline:  Goal status: ongoing 02/21/2023   3.  Increase DNF hold to >/= 45 seconds to demonstrate improvement in DNF strength/ endurance Baseline:  Goal status: ongoing 02/21/2023   4.  Pt to be able to verbalize/ demo efficient posture and lifting mechanics to reduce and prevent neck pain.  Baseline:  Goal status: partially met 02/21/2023      LONG TERM GOALS: Target date: UPDATED 03/28/2023     Increase cervical extension/ rotation and side bending by >/= 10 degrees in all planes to promote functional ROM required for ADLS and safety with driving with </= 2/13 max pain Baseline:  Goal status: ongoing   2.  Increase bil UE gross strength to >/= 4+/5 to assist with ADLs, and lifting items from an overhead shelf with </= min difficulty Baseline:  Goal status: ongoing  3.  Increase DNF endurance to >/= 60 seconds for cervical strength/ endurance Baseline:  Goal status: ongoing   4.  Pt to be able to return to self exercise program per his personal goals.  Baseline:  Goal status: ongoing   5.  Increaes FOTO score to >/= 56% to demo improvement in overall function Baseline: initial score 36% Goal status: ongoing   6.  Pt to be IND with all HEP and is able to maintain and progress their current LOF IND.  Baseline:  Goal status: ongoing  7.  Reduce paraspinal tightness to promote trunk mobility and reduce pain to </=5/10 Baseline:  Goal status: INITIAL  8.  Pt to be able to sit, stand and walk for >/= 60 min with max report of 5/10 for functional endurance required for ALDS and work related tasks.  Baseline:  Goal status: INITIAL    PLAN:   PT FREQUENCY: 1-2x/week   PT DURATION: 8 weeks   PLANNED INTERVENTIONS: Therapeutic exercises, Therapeutic activity, Neuromuscular re-education, Balance training, Gait training, Patient/Family  education, Self Care, Joint mobilization, Joint manipulation, Dry Needling, Spinal mobilization, Cryotherapy, Moist heat, Taping, Traction, Ultrasound, Ionotophoresis 4mg /ml Dexamethasone, Manual therapy, and Re-evaluation   PLAN FOR NEXT SESSION: review/ update HEP PRN. STW along upper trap/ levator/ cervical paraspinals/ sub-occipitals. Cervical mobs. Posture education. Posterior chain activation, upper trap/ pec stretching.  Back potential for SIJ involvement with anterior deficit hamsring stretching, SLR/ flexor METs. STW along lumbar paraspinals.  Tyianna Menefee PT, DPT, LAT, ATC  02/21/23  10:09 AM

## 2023-02-23 ENCOUNTER — Ambulatory Visit: Payer: Commercial Managed Care - HMO | Admitting: Physical Therapy

## 2023-02-23 ENCOUNTER — Encounter (HOSPITAL_COMMUNITY): Payer: Self-pay

## 2023-02-23 ENCOUNTER — Ambulatory Visit (HOSPITAL_COMMUNITY)
Admission: EM | Admit: 2023-02-23 | Discharge: 2023-02-23 | Disposition: A | Discharge status: Home / Self Care | Payer: Commercial Managed Care - HMO | Attending: Emergency Medicine | Admitting: Emergency Medicine

## 2023-02-23 ENCOUNTER — Ambulatory Visit (INDEPENDENT_AMBULATORY_CARE_PROVIDER_SITE_OTHER): Payer: Commercial Managed Care - HMO

## 2023-02-23 DIAGNOSIS — M25511 Pain in right shoulder: Secondary | ICD-10-CM

## 2023-02-23 DIAGNOSIS — M791 Myalgia, unspecified site: Secondary | ICD-10-CM | POA: Diagnosis not present

## 2023-02-23 HISTORY — DX: Essential (primary) hypertension: I10

## 2023-02-23 MED ORDER — KETOROLAC TROMETHAMINE 30 MG/ML IJ SOLN
30.0000 mg | Freq: Once | INTRAMUSCULAR | Status: AC
  Administered 2023-02-23: 30 mg via INTRAMUSCULAR

## 2023-02-23 MED ORDER — KETOROLAC TROMETHAMINE 30 MG/ML IJ SOLN
INTRAMUSCULAR | Status: AC
  Filled 2023-02-23: qty 1

## 2023-02-23 MED ORDER — IBUPROFEN 600 MG PO TABS: 600.0000 mg | ORAL_TABLET | Freq: Four times a day (QID) | ORAL | 0 refills | Status: DC | PRN

## 2023-02-23 MED ORDER — CYCLOBENZAPRINE HCL 10 MG PO TABS: 10.0000 mg | ORAL_TABLET | Freq: Two times a day (BID) | ORAL | 0 refills | Status: DC | PRN
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
## 2023-02-28 ENCOUNTER — Encounter: Payer: Self-pay | Admitting: Physical Therapy

## 2023-02-28 ENCOUNTER — Ambulatory Visit: Payer: Commercial Managed Care - HMO | Admitting: Physical Therapy

## 2023-02-28 DIAGNOSIS — M5459 Other low back pain: Secondary | ICD-10-CM

## 2023-02-28 DIAGNOSIS — M542 Cervicalgia: Secondary | ICD-10-CM | POA: Diagnosis not present

## 2023-02-28 DIAGNOSIS — M62838 Other muscle spasm: Secondary | ICD-10-CM

## 2023-02-28 DIAGNOSIS — M6281 Muscle weakness (generalized): Secondary | ICD-10-CM
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
## 2023-03-01 ENCOUNTER — Ambulatory Visit: Payer: Commercial Managed Care - HMO | Admitting: Internal Medicine

## 2023-03-02 ENCOUNTER — Ambulatory Visit: Payer: Commercial Managed Care - HMO | Admitting: Physical Therapy

## 2023-03-07 ENCOUNTER — Ambulatory Visit: Payer: Commercial Managed Care - HMO | Admitting: Physical Therapy

## 2023-03-09 ENCOUNTER — Ambulatory Visit: Payer: Commercial Managed Care - HMO | Admitting: Physical Therapy

## 2023-03-22 ENCOUNTER — Ambulatory Visit (INDEPENDENT_AMBULATORY_CARE_PROVIDER_SITE_OTHER): Payer: Commercial Managed Care - HMO | Admitting: Internal Medicine

## 2023-03-22 ENCOUNTER — Encounter: Payer: Self-pay | Admitting: Internal Medicine

## 2023-03-22 VITALS — BP 122/78 | HR 79 | Temp 98.9°F | Ht 70.0 in | Wt 218.0 lb

## 2023-03-22 DIAGNOSIS — E7800 Pure hypercholesterolemia, unspecified: Secondary | ICD-10-CM

## 2023-03-22 DIAGNOSIS — G8929 Other chronic pain: Secondary | ICD-10-CM

## 2023-03-22 DIAGNOSIS — M5442 Lumbago with sciatica, left side: Secondary | ICD-10-CM | POA: Diagnosis not present

## 2023-03-22 DIAGNOSIS — E559 Vitamin D deficiency, unspecified: Secondary | ICD-10-CM | POA: Diagnosis not present

## 2023-03-22 DIAGNOSIS — M5441 Lumbago with sciatica, right side: Secondary | ICD-10-CM | POA: Diagnosis not present

## 2023-03-22 DIAGNOSIS — E1165 Type 2 diabetes mellitus with hyperglycemia: Secondary | ICD-10-CM

## 2023-03-22 MED ORDER — TRAMADOL HCL 50 MG PO TABS: 50.0000 mg | ORAL_TABLET | Freq: Four times a day (QID) | ORAL | 0 refills | Status: AC | PRN

## 2023-03-22 MED ORDER — GABAPENTIN 100 MG PO CAPS: 100.0000 mg | ORAL_CAPSULE | Freq: Three times a day (TID) | ORAL | 3 refills | Status: DC

## 2023-03-22 NOTE — Patient Instructions (Signed)
Please take all new medication as prescribed - the tramadol for pain (and cal in 1 week if you need further refill)  Please take all new medication as prescribed - the gabapentin for the nerve pain  Please continue all other medications as before, and refills have been done if requested.  Please have the pharmacy call with any other refills you may need.  Please keep your appointments with your specialists as you may have planned  You will be contacted regarding the referral for: [facility]

## 2023-03-23 ENCOUNTER — Encounter: Payer: Self-pay | Admitting: Internal Medicine

## 2023-03-23 NOTE — Assessment & Plan Note (Signed)
Lab Results  Component Value Date   HGBA1C 6.6 (H) 12/28/2022   Mild uncontrolled, pt to continue current medical treatment  - diet, wt control

## 2023-03-23 NOTE — Assessment & Plan Note (Signed)
  Uncontrolled, pt to continue low chol diet, declines statin for now  Lab Results  Component Value Date   CHOL 235 (H) 12/28/2022   HDL 41.20 12/28/2022   LDLCALC 135 (H) 02/15/2017   LDLDIRECT 144.0 12/28/2022   TRIG 507.0 (H) 12/28/2022   CHOLHDL 6 12/28/2022

## 2023-03-23 NOTE — Assessment & Plan Note (Signed)
Last vitamin D Lab Results  Component Value Date   VD25OH 8.33 (L) 12/28/2022   Low, to start oral replacement  

## 2023-03-23 NOTE — Assessment & Plan Note (Addendum)
With persistent pain, for tramadol prn pain, gabapentin 100 tid, and refer [facility] per pt request
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
## 2023-03-29 ENCOUNTER — Encounter: Payer: Self-pay | Admitting: Physical Therapy

## 2023-03-29 ENCOUNTER — Ambulatory Visit: Payer: Commercial Managed Care - HMO | Attending: Internal Medicine | Admitting: Physical Therapy

## 2023-03-29 DIAGNOSIS — M5441 Lumbago with sciatica, right side: Secondary | ICD-10-CM | POA: Insufficient documentation

## 2023-03-29 DIAGNOSIS — M5442 Lumbago with sciatica, left side: Secondary | ICD-10-CM | POA: Insufficient documentation

## 2023-03-29 DIAGNOSIS — G8929 Other chronic pain: Secondary | ICD-10-CM | POA: Diagnosis not present

## 2023-03-29 DIAGNOSIS — M542 Cervicalgia: Secondary | ICD-10-CM | POA: Diagnosis present

## 2023-03-29 DIAGNOSIS — M5459 Other low back pain: Secondary | ICD-10-CM

## 2023-03-29 DIAGNOSIS — M6281 Muscle weakness (generalized): Secondary | ICD-10-CM | POA: Diagnosis present
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
## 2023-03-29 NOTE — Patient Instructions (Signed)
Aquatic Therapy at [facility]-  What to Expect!  Where:   [facility] [address] [place], [place] [address] Rehab phone [phone]  NOTE:  You will receive an automated phone message reminding you of your appt and it will say the appointment is at the [address] [facility] [address] [facility] clinic.          How to Prepare: Please make sure you drink 8 ounces of water about one hour prior to your pool session A caregiver may attend if needed with the patient to help assist as needed. A caregiver can sit in the pool room on chair. Please arrive IN YOUR SUIT and 15 minutes prior to your appointment - this helps to avoid delays in starting your session. Please make sure to attend to any toileting needs prior to entering the pool Locker rooms for changing are provided.   There is direct access to the pool deck form the locker room.  You can lock your belongings in a locker with lock provided. Once on the pool deck your therapist will ask if you have signed the Patient  Consent and Assignment of Benefits form before beginning treatment Your therapist may take your blood pressure prior to, during and after your session if indicated We usually try and create a home exercise program based on activities we do in the pool.  Please be thinking about who might be able to assist you in the pool should you need to participate in an aquatic home exercise program at the time of discharge if you need assistance.  Some patients do not want to or do not have the ability to participate in an aquatic home program - this is not a barrier in any way to you participating in aquatic therapy as part of your current therapy plan! After Discharge from PT, you can continue using home program at  the [facility]/, there is a drop-in fee for $5 ($45 a month)or for 60 years  or older $4.00 ($40 a month for seniors ) or any local [employer] pool.  Memberships for purchase are  available for gym/pool at [facility]  IT IS VERY IMPORTANT THAT YOUR LAST VISIT BE IN THE CLINIC AT [address] AFTER YOUR LAST AQUATIC VISIT.  PLEASE MAKE SURE THAT YOU HAVE A [address]  APPOINTMENT SCHEDULED.   About the pool: Pool is located approximately 500 FT from the entrance of the building.  Please bring a support person if you need assistance traveling this      distance.   Your therapist will assist you in entering the water; there are two ways to           enter: stairs with railings, and a mechanical lift. Your therapist will determine the most appropriate way for you.  Water temperature is usually between 88-90 degrees  There may be up to 2 other swimmers in the pool at the same time  The pool deck is tile, please wear shoes with good traction if you prefer not to be barefoot.    Contact Info:  For appointment scheduling and cancellations:         Please call the [facility]  PH:[phone]              Aquatic Therapy  Outpatient [facility]       All sessions are 45 minutes                                                    

## 2023-04-07 ENCOUNTER — Ambulatory Visit: Payer: Commercial Managed Care - HMO | Admitting: Physical Therapy

## 2023-04-19 ENCOUNTER — Ambulatory Visit: Payer: Commercial Managed Care - HMO | Attending: Internal Medicine | Admitting: Physical Therapy

## 2023-04-19 ENCOUNTER — Encounter: Payer: Self-pay | Admitting: Physical Therapy

## 2023-04-19 DIAGNOSIS — M6281 Muscle weakness (generalized): Secondary | ICD-10-CM | POA: Diagnosis not present

## 2023-04-19 DIAGNOSIS — M5459 Other low back pain: Secondary | ICD-10-CM | POA: Diagnosis present

## 2023-04-19 DIAGNOSIS — M542 Cervicalgia: Secondary | ICD-10-CM | POA: Diagnosis present

## 2023-04-19 NOTE — Therapy (Signed)
OUTPATIENT PHYSICAL THERAPY TREATMENT NOTE / RE-EVALUATION   Patient Name: Trevor Wallace. MRN: [id number] DOB:03/22/1976, 47 y.o., male Today's Date: 04/19/2023  PCP: Corwin Levins, MD   REFERRING PROVIDER: Corwin Levins, MD  END OF SESSION:   PT End of Session - 04/19/23 1020     Visit Number 7    Number of Visits 13    Date for PT Re-Evaluation 05/24/23    Authorization Type cigna    PT Start Time 1019    PT Stop Time 1110    PT Time Calculation (min) 51 min    Activity Tolerance Patient tolerated treatment well;No increased pain                 Past Medical History:  Diagnosis Date   Chest pain    Hypertension    Prediabetes    History reviewed. No pertinent surgical history. Patient Active Problem List   Diagnosis Date Noted   Chronic low back pain with bilateral sciatica 03/22/2023   Enlarged atria 08/30/2022   Chest pain 08/30/2022   Midline low back pain with right-sided sciatica 08/30/2022   Abdominal pain 01/16/2022   Right sided abdominal pain 01/16/2022   Right shoulder pain 10/15/2021   Vitamin D deficiency 08/13/2021   HLD (hyperlipidemia) 08/13/2021   Migraine 08/13/2021   Cervical myelopathy (HCC) 06/09/2021   Gait disorder 06/09/2021   RLS (restless legs syndrome) 06/09/2021   COVID-19 virus infection 10/27/2020   Urinary frequency 04/10/2020   Elevated LFTs 09/27/2019   Encounter for well adult exam with abnormal findings 02/15/2017   Former smoker 02/15/2017   Dysuria 02/15/2017   Diabetes (HCC)     REFERRING DIAG: Cervical spondylosis [M47.812]      Midline low back pain without sciatica, unspecified chronicity [M54.50]  (added on 02/16/23)  THERAPY DIAG:  Muscle weakness (generalized)  Other low back pain  Cervicalgia  Rationale for Evaluation and Treatment Rehabilitation  PERTINENT HISTORY:  DM, Cervical Myelopathy,   PRECAUTIONS: none  SUBJECTIVE:                                                                                                                                                                                       SUBJECTIVE STATEMENT: "I've been working and resting since the last session. I feel like its getting worse since the last session. I feel like I been getting worse the longer I go without treatment."   PAIN:   Back Are you having pain? Yes: NPRS scale: 10/10  (denies needing to go to the ED) Pain location: across the whole back , with increased in the middle Pain description: soreness, tightness  Aggravating factors: constant movement, standing Relieving factors: sitting and resting the back against something hard.    Neck Are you having pain? Yes: NPRS scale: 10/10 (denies needing to go to the ED) Pain location: posterior neck, down back, and referred symptoms into the front of thighs Pain description: constant, intermittent sharp pain, Aggravating factors: looking down,  Relieving factors: leaning forward, holding head up with hands.    OBJECTIVE: (objective measures completed at initial evaluation unless otherwise dated)   DIAGNOSTIC FINDINGS:  N/A   PATIENT SURVEYS:  FOTO 36% predicted 56%   COGNITION: Overall cognitive status: Within functional limits for tasks assessed   SENSATION: WFL   POSTURE: rounded shoulders and forward head   PALPATION: TTP along bil upper trap, levator scapulae, and cervical paraspinals with multiple trigger points noted.    02/16/23 Back:   TTP along bil lumbar parapsinals with R>L and soreness located at bil SIJ with R>L.   CERVICAL ROM:    Active ROM A/PROM (deg) eval 03/29/23  Flexion 30  40  Extension 12 20  Right lateral flexion 20 20  Left lateral flexion 10 22  Right rotation 38 44  Left rotation 36 58   (Blank rows = not tested) (*= concordant pain) LUMBAR ROM:   Active  02/16/2023 03/29/2023  Flexion 40 30  Extension 20 14  Right lateral flexion 10 10 *  Left lateral flexion 10 10  Right rotation    Left  rotation     (Blank rows = not tested)  (*= concordant pain)  UPPER EXTREMITY ROM:   Active ROM Right eval Left eval  Shoulder flexion [facility]  Shoulder extension [facility]  Shoulder abduction [facility]  Shoulder adduction      Shoulder extension      Shoulder internal rotation [facility]  Shoulder external rotation [facility]  Elbow flexion      Elbow extension      Wrist flexion      Wrist extension      Wrist ulnar deviation      Wrist radial deviation      Wrist pronation      Wrist supination       (Blank rows = not tested)  UPPER EXTREMITY MMT:   MMT Right eval Left eval Right 03/29/23 Left 03/29/23  Shoulder flexion 4 4- 4 4-  Shoulder extension 4+ 4+ 4+ 4+  Shoulder abduction 4 4 4 4   Shoulder adduction        Shoulder internal rotation 4+ 4+ 4+ 4+  Shoulder external rotation 4+ 4- 4+ 4-  Middle trapezius        Lower trapezius        Elbow flexion        Elbow extension        Wrist flexion        Wrist extension        Wrist ulnar deviation        Wrist radial deviation        Wrist pronation        Wrist supination        Grip strength         (Blank rows = not tested) LOWER EXTREMITY MMT:    MMT Right 02/16/23 Left 02/16/23 Right 03/29/23 Left 03/29/23  Hip flexion 4 4 4 4   Hip extension 4+ 4+ 4+ 4+  Hip abduction 4 4 4 4   Hip adduction      Hip internal rotation  Hip external rotation      Knee flexion      Knee extension      Ankle dorsiflexion      Ankle plantarflexion      Ankle inversion      Ankle eversion       (Blank rows = not tested)   CERVICAL SPECIAL TESTS:  Neck flexor muscle endurance test: Positive, Upper limb tension test (ULTT): Negative, Spurling's test: Negative, and Distraction test: Positive     DNF test able to hold for 29 seconds before fatiguing  LUMBAR SPECIAL TESTS:  SI Compression/distraction test: R, Gaenslen's test: R, and Forward flexion with decreased superior SIJ movement  on the R compared bil      FUNCTIONAL TESTS:      TODAY'S TREATMENT:                                                                                                                              [facility] Adult PT Treatment:                                                DATE: 04/19/2023 Therapeutic Exercise: Marjo Bicker pose stretch 2 x 60 sec Upper trap stretch 2 x 30 sec Seated chin tuck 2 x 10 holding 10 sec (demonstration for proper form) Seated lower trap activation with elbow propped on bolster 2 x 10 with RTB) Manual Therapy: IASTM along bil lumbar paraspinals/ myofascial release  Trigger Point Dry-Needling  Treatment instructions: Expect mild to moderate muscle soreness. S/S of pneumothorax if dry needled over a lung field, and to seek immediate medical attention should they occur. Patient verbalized understanding of these instructions and education.  Patient Consent Given: Yes Education handout provided: Previously provided Muscles treated: bil lumbar paraspinals Electrical stimulation performed: Yes Parameters:  CPS L 20 x 10 min adjusting intensity PRN to tolerance Treatment response/outcome: pt tolerated well noting decreased stiffness  Modalities: Cervical Traction: x 16 min, max pull 20#, min 8 lbs. Hold 60 sec, progression 20 sec    [facility] Adult PT Treatment:                                                DATE: 03/07/23 Therapeutic Exercise: Nu-step L5 x 6 min Seated low back stretch 2 x 30 sec Seated hamstring stretch bil 2 x 30 sec  Manual Therapy: MTPR using tennis ball along the wall for low back x 4 Modalities:  Cervical Traction: x 10 min, max pull 12#, min 8 lbs. Hold 60 sec, progression 20 sec  [facility] Adult PT Treatment:  DATE: 02/28/23 Therapeutic Exercise: SKTC Gentle LTR Hamstring stretch with strap ITB stretch with strap  Supine marching   Modalities: HMP x 15 minutes to neck and back  Self Care: Use of ice /heat and gentle stretching  for acute pain, follow up with primary tomorrow, take meds as prescribed.    PATIENT EDUCATION:  Education details: evaluation findings, POC, goals, HEP with proper form/ rationale Person educated: Patient Education method: Explanation, Verbal cues, and Handouts Education comprehension: verbalized understanding   HOME EXERCISE PROGRAM: Access Code: [email or web address] URL: [email or web address] Date: 01/31/2023 Prepared by: Lulu Riding   Exercises for Neck - Seated Upper Trapezius Stretch  - 1 x daily - 7 x weekly - 2 sets - 2 reps - 30 seconds hold - Standing Cervical Retraction  - 1 x daily - 7 x weekly - 2 sets - 10 reps - 5 seconds hold - Supine Chin Tuck  - 1 x daily - 7 x weekly - 2 sets - 10 reps - 5 seconds hold - Seated Scapular Retraction  - 1 x daily - 7 x weekly - 2 sets - 10 reps - 5 seconds hold - Standing Isometric Cervical Rotation  - 1 x daily - 7 x weekly - 2 sets - 10 reps - Seated Isometric Cervical Sidebending  - 1 x daily - 7 x weekly - 2 sets - 10 reps  Exercises for Back Access Code: [email or web address] URL: [email or web address] Date: 02/16/2023 Prepared by: Lulu Riding   - Lower Trunk Rotation  - 1 x daily - 7 x weekly - 2 sets - 20 reps - Supine Hamstring Stretch with Strap  - 1 x daily - 7 x weekly - 3 sets - 2 reps - 30 hold - Seated Hamstring Stretch  - 1 x daily - 7 x weekly - 2 sets - 2 reps - 30 hold - SLR (Mirrored)  - 1 x daily - 7 x weekly - 3 sets - 12 reps - 1 hold   ASSESSMENT:   CLINICAL IMPRESSION: Mr Enamorado arrives to session noting he continues to have increased pain and stiffness that has worsened between his PT visits and notes pain is a 10/10 for both his neck and back. Despite reporting a high level of pain he denies the need to go to the ED when asked and exhibits no visual indicators that would typically coincide with that report of pain. Educated and consent was given for TPDN combined with E-stim for the  lumbar paraspinals followed with E-stim. He noted reduction in low back stiffness following TPDN and IASTM techniques. Continued working on posterior shoulder activation and DNF activation. Pt noted improvement with cervical traction following last  session so continued today with increased tension.    OBJECTIVE IMPAIRMENTS: decreased activity tolerance, decreased endurance, decreased ROM, decreased strength, increased fascial restrictions, increased muscle spasms, improper body mechanics, postural dysfunction, and pain.    ACTIVITY LIMITATIONS: carrying, lifting, reach over head, and weight lifting/ sporting activities    PARTICIPATION LIMITATIONS: community activity, occupation, and yard work   PERSONAL FACTORS: Time since onset of injury/illness/exacerbation and 1-2 comorbidities: DM, Cervical Myelopathy,   are also affecting patient's functional outcome.    REHAB POTENTIAL: Good   CLINICAL DECISION MAKING: Evolving/moderate complexity   EVALUATION COMPLEXITY: Moderate     GOALS: Goals reviewed with patient? Yes   SHORT TERM GOALS: Target date: UPDATED  04/26/2023     Pt to be IND with initial HEP for therapeutic progression Baseline:  Goal status: met  02/21/2023   2.  Reduce upper trap and surrounding muscle spasm to promote cervical ROM and reduce pain to </= 6/10 pain at max Baseline:  Goal status: ongoing 03/29/2023   3.  Increase DNF hold to >/= 45 seconds to demonstrate improvement in DNF strength/ endurance Baseline:  Goal status: ongoing 03/29/2023   4.  Pt to be able to verbalize/ demo efficient posture and lifting mechanics to reduce and prevent neck pain.  Baseline:  Goal status: partially met 03/29/2023      LONG TERM GOALS: Target date: UPDATED 05/24/2023      Increase cervical extension/ rotation and side bending by >/= 10 degrees in all planes to promote functional ROM required for ADLS and safety with driving with </= 8/11 max pain Baseline:  Goal status:  ongoing 03/29/2023   2.  Increase bil UE gross strength to >/= 4+/5 to assist with ADLs, and lifting items from an overhead shelf with </= min difficulty Baseline:  Goal status: ongoing 03/29/2023   3.  Increase DNF endurance to >/= 60 seconds for cervical strength/ endurance Baseline:  Goal status: ongoing 03/29/2023   4.  Pt to be able to return to self exercise program per his personal goals.  Baseline:  Goal status: ongoing 03/29/2023   5.  Increaes FOTO score to >/= 56% to demo improvement in overall function Baseline: initial score 36% Goal status: ongoing 03/29/2023   6.  Pt to be IND with all HEP and is able to maintain and progress their current LOF IND.  Baseline:  Goal status: ongoing 03/29/2023  7.  Reduce paraspinal tightness to promote trunk mobility and reduce pain to </=5/10 Baseline:  Goal status: ongoing 03/29/2023  8.  Pt to be able to sit, stand and walk for >/= 60 min with max report of 5/10 for functional endurance required for ALDS and work related tasks.  Baseline:  Goal status: ongoing 03/29/2023    PLAN:   PT FREQUENCY: 1-2x/week   PT DURATION: 8 weeks   PLANNED INTERVENTIONS: Therapeutic exercises, Therapeutic activity, Neuromuscular re-education, Balance training, Gait training, Patient/Family education, Self Care, Joint mobilization, Joint manipulation, Dry Needling, Spinal mobilization, Cryotherapy, Moist heat, Taping, Traction, Ultrasound, Ionotophoresis 4mg /ml Dexamethasone, Manual therapy, and Re-evaluation   PLAN FOR NEXT SESSION:  review/ update HEP PRN.  Response to cervical mech traction, STW along upper trap/ levator/ cervical paraspinals/ sub-occipitals. Cervical mobs. Posture education. Posterior chain activation, upper trap/ pec stretching.  Back potential for SIJ involvement with anterior deficit hamsring stretching, SLR/ flexor METs. STW along lumbar paraspinals.  Chanse Kagel PT, DPT, LAT, ATC  04/19/23  12:27 PM

## 2023-04-21 ENCOUNTER — Ambulatory Visit: Payer: Commercial Managed Care - HMO | Admitting: Physical Therapy

## 2023-04-25 ENCOUNTER — Telehealth: Payer: Self-pay | Admitting: Physical Therapy

## 2023-04-25 NOTE — Telephone Encounter (Signed)
Patient requesting a call regarding his back and neck

## 2023-04-25 NOTE — Telephone Encounter (Signed)
I returned Mr Mazariego call. He stated he was having increased pain in his back and neck last night resulting in him having to call out from work which he thinks was related to the treatment that was provided at his last PT appointment on Wed 6/5. He asked since he was having the pain and wasn't able to go to work if he could get a note for work.   I mentioned the effect of the treatment may cause soreness up to 48 hours which is completely normal. However, it would be highly unlikely for it to cause the what he is describing, especially 5 days following treatment. The treatment helps reduce muscle tension / tightness, however doesn't stop other factors that could cause spasm or issues which could have occurred.   If he is having that level of pain or problems limiting him from working or being able to get up then he needs to go to the emergency to be seen. Unfortunately, I cannot write an excuse for him to be out of work he verbalized understanding and stated he would be coming in to his appointment tomorrow.  Angeldejesus Callaham PT, DPT, LAT, ATC  04/25/23  1:10 PM

## 2023-04-26 ENCOUNTER — Ambulatory Visit: Payer: Commercial Managed Care - HMO | Admitting: Physical Therapy

## 2023-04-26 DIAGNOSIS — M5459 Other low back pain: Secondary | ICD-10-CM

## 2023-04-26 DIAGNOSIS — M6281 Muscle weakness (generalized): Secondary | ICD-10-CM | POA: Diagnosis not present

## 2023-04-26 NOTE — Therapy (Signed)
OUTPATIENT PHYSICAL THERAPY TREATMENT NOTE / DISCHARGE  PHYSICAL THERAPY DISCHARGE SUMMARY  Visits from Start of Care: 8  Current functional level related to goals / functional outcomes: See goals, FOTO 36%   Remaining deficits: Continued elevated pain, muscle spasms in the neck/ back limited general mobility and function.    Education / Equipment: Posture, HEP, theraband   Patient agrees to discharge. Patient goals were not met. Patient is being discharged due to lack of progress.    Patient Name: Trevor Wallace. MRN: [id number] DOB:[birth year], 47 y.o., male Today's Date: 04/26/2023  PCP: Corwin Levins, MD   REFERRING PROVIDER: Corwin Levins, MD  END OF SESSION:   PT End of Session - 04/26/23 1017     Visit Number 8    Number of Visits 13    Date for PT Re-Evaluation 05/24/23    Authorization Type cigna    PT Start Time 1017    PT Stop Time 1042    PT Time Calculation (min) 25 min    Activity Tolerance Patient tolerated treatment well;No increased pain    Behavior During Therapy WFL for tasks assessed/performed                  Past Medical History:  Diagnosis Date   Chest pain    Hypertension    Prediabetes    No past surgical history on file. Patient Active Problem List   Diagnosis Date Noted   Chronic low back pain with bilateral sciatica 03/22/2023   Enlarged atria 08/30/2022   Chest pain 08/30/2022   Midline low back pain with right-sided sciatica 08/30/2022   Abdominal pain 01/16/2022   Right sided abdominal pain 01/16/2022   Right shoulder pain 10/15/2021   Vitamin D deficiency 08/13/2021   HLD (hyperlipidemia) 08/13/2021   Migraine 08/13/2021   Cervical myelopathy (HCC) 06/09/2021   Gait disorder 06/09/2021   RLS (restless legs syndrome) 06/09/2021   COVID-19 virus infection 10/27/2020   Urinary frequency 04/10/2020   Elevated LFTs 09/27/2019   Encounter for well adult exam with abnormal findings 02/15/2017   Former smoker  02/15/2017   Dysuria 02/15/2017   Diabetes (HCC)     REFERRING DIAG: Cervical spondylosis [M47.812]      Midline low back pain without sciatica, unspecified chronicity [M54.50]  (added on 02/16/23)  THERAPY DIAG:  Muscle weakness (generalized)  Other low back pain  Rationale for Evaluation and Treatment Rehabilitation  PERTINENT HISTORY:  DM, Cervical Myelopathy,   PRECAUTIONS: none  SUBJECTIVE:  SUBJECTIVE STATEMENT: "Monday my neck really started feeling more tight and there is a sensation that I can't really explain."   PAIN:   Back Are you having pain? Yes: NPRS scale: 9/10  (denies needing to go to the ED) Pain location: across the whole back , with increased in the middle Pain description: soreness, tightness Aggravating factors: constant movement, standing Relieving factors: sitting and resting the back against something hard.    Neck Are you having pain? Yes: NPRS scale: 9/10 (denies needing to go to the ED) Pain location: posterior neck, down back, and referred symptoms into the front of thighs Pain description: constant, intermittent sharp pain, Aggravating factors: looking down,  Relieving factors: leaning forward, holding head up with hands.    OBJECTIVE: (objective measures completed at initial evaluation unless otherwise dated)   DIAGNOSTIC FINDINGS:  N/A   PATIENT SURVEYS:  FOTO 36% predicted 56%   COGNITION: Overall cognitive status: Within functional limits for tasks assessed   SENSATION: WFL   POSTURE: rounded shoulders and forward head   PALPATION: TTP along bil upper trap, levator scapulae, and cervical paraspinals with multiple trigger points noted.    02/16/23 Back:   TTP along bil lumbar parapsinals with R>L and soreness located at bil SIJ with R>L.    CERVICAL ROM:    Active ROM A/PROM (deg) eval 03/29/23  Flexion 30  40  Extension 12 20  Right lateral flexion 20 20  Left lateral flexion 10 22  Right rotation 38 44  Left rotation 36 58   (Blank rows = not tested) (*= concordant pain) LUMBAR ROM:   Active  02/16/2023 03/29/2023  Flexion 40 30  Extension 20 14  Right lateral flexion 10 10 *  Left lateral flexion 10 10  Right rotation    Left rotation     (Blank rows = not tested)  (*= concordant pain)  UPPER EXTREMITY ROM:   Active ROM Right eval Left eval  Shoulder flexion [facility]  Shoulder extension [facility]  Shoulder abduction [facility]  Shoulder adduction      Shoulder extension      Shoulder internal rotation [facility]  Shoulder external rotation [facility]  Elbow flexion      Elbow extension      Wrist flexion      Wrist extension      Wrist ulnar deviation      Wrist radial deviation      Wrist pronation      Wrist supination       (Blank rows = not tested)  UPPER EXTREMITY MMT:   MMT Right eval Left eval Right 03/29/23 Left 03/29/23  Shoulder flexion 4 4- 4 4-  Shoulder extension 4+ 4+ 4+ 4+  Shoulder abduction 4 4 4 4   Shoulder adduction        Shoulder internal rotation 4+ 4+ 4+ 4+  Shoulder external rotation 4+ 4- 4+ 4-  Middle trapezius        Lower trapezius        Elbow flexion        Elbow extension        Wrist flexion        Wrist extension        Wrist ulnar deviation        Wrist radial deviation        Wrist pronation        Wrist supination        Grip strength         (  Blank rows = not tested) LOWER EXTREMITY MMT:    MMT Right 02/16/23 Left 02/16/23 Right 03/29/23 Left 03/29/23  Hip flexion 4 4 4 4   Hip extension 4+ 4+ 4+ 4+  Hip abduction 4 4 4 4   Hip adduction      Hip internal rotation      Hip external rotation      Knee flexion      Knee extension      Ankle dorsiflexion      Ankle plantarflexion      Ankle inversion      Ankle eversion       (Blank rows = not tested)    CERVICAL SPECIAL TESTS:  Neck flexor muscle endurance test: Positive, Upper limb tension test (ULTT): Negative, Spurling's test: Negative, and Distraction test: Positive     DNF test able to hold for 29 seconds before fatiguing  LUMBAR SPECIAL TESTS:  SI Compression/distraction test: R, Gaenslen's test: R, and Forward flexion with decreased superior SIJ movement  on the R compared bil     FUNCTIONAL TESTS:      TODAY'S TREATMENT:                                                                                                                              [facility] Adult PT Treatment:                                                DATE: 04/26/2023 Therapeutic Exercise: Reviewed HEP for neck/ back  [facility] Adult PT Treatment:                                                DATE: 04/19/2023 Therapeutic Exercise: Marjo Bicker pose stretch 2 x 60 sec Upper trap stretch 2 x 30 sec Seated chin tuck 2 x 10 holding 10 sec (demonstration for proper form) Seated lower trap activation with elbow propped on bolster 2 x 10 with RTB) Manual Therapy: IASTM along bil lumbar paraspinals/ myofascial release  Trigger Point Dry-Needling  Treatment instructions: Expect mild to moderate muscle soreness. S/S of pneumothorax if dry needled over a lung field, and to seek immediate medical attention should they occur. Patient verbalized understanding of these instructions and education.  Patient Consent Given: Yes Education handout provided: Previously provided Muscles treated: bil lumbar paraspinals Electrical stimulation performed: Yes Parameters:  CPS L 20 x 10 min adjusting intensity PRN to tolerance Treatment response/outcome: pt tolerated well noting decreased stiffness  Modalities: Cervical Traction: x 16 min, max pull 20#, min 8 lbs. Hold 60 sec, progression 20 sec    [facility] Adult PT Treatment:  DATE: 03/07/23 Therapeutic Exercise: Nu-step L5 x 6 min Seated low back  stretch 2 x 30 sec Seated hamstring stretch bil 2 x 30 sec  Manual Therapy: MTPR using tennis ball along the wall for low back x 4 Modalities:  Cervical Traction: x 10 min, max pull 12#, min 8 lbs. Hold 60 sec, progression 20 sec  [facility] Adult PT Treatment:                                                DATE: 02/28/23 Therapeutic Exercise: SKTC Gentle LTR Hamstring stretch with strap ITB stretch with strap  Supine marching   Modalities: HMP x 15 minutes to neck and back  Self Care: Use of ice /heat and gentle stretching for acute pain, follow up with primary tomorrow, take meds as prescribed.    PATIENT EDUCATION:  Education details: evaluation findings, POC, goals, HEP with proper form/ rationale Person educated: Patient Education method: Explanation, Verbal cues, and Handouts Education comprehension: verbalized understanding   HOME EXERCISE PROGRAM: Access Code: [email or web address] URL: [email or web address] Date: 01/31/2023 Prepared by: Lulu Riding   Exercises for Neck - Seated Upper Trapezius Stretch  - 1 x daily - 7 x weekly - 2 sets - 2 reps - 30 seconds hold - Standing Cervical Retraction  - 1 x daily - 7 x weekly - 2 sets - 10 reps - 5 seconds hold - Supine Chin Tuck  - 1 x daily - 7 x weekly - 2 sets - 10 reps - 5 seconds hold - Seated Scapular Retraction  - 1 x daily - 7 x weekly - 2 sets - 10 reps - 5 seconds hold - Standing Isometric Cervical Rotation  - 1 x daily - 7 x weekly - 2 sets - 10 reps - Seated Isometric Cervical Sidebending  - 1 x daily - 7 x weekly - 2 sets - 10 reps  Exercises for Back Access Code: [email or web address] URL: [email or web address] Date: 02/16/2023 Prepared by: Lulu Riding   - Lower Trunk Rotation  - 1 x daily - 7 x weekly - 2 sets - 20 reps - Supine Hamstring Stretch with Strap  - 1 x daily - 7 x weekly - 3 sets - 2 reps - 30 hold - Seated Hamstring Stretch  - 1 x daily - 7 x weekly - 2 sets - 2 reps - 30 hold -  SLR (Mirrored)  - 1 x daily - 7 x weekly - 3 sets - 12 reps - 1 hold   ASSESSMENT:   CLINICAL IMPRESSION: Mrs Wilmore continues to report high levels of pain which he reports is starting to impact his ability to work.and demonstrates limited functional improvement over the last 8 visits with no functional carry over from session to session. He has made limited to no progress toward his goals as a result of pain. Based on his continued high level report of pain, limited functional improvement it would be in his best interest to follow up with a neurologist for further assessment, and we will formally discharge from PT today.    OBJECTIVE IMPAIRMENTS: decreased activity tolerance, decreased endurance, decreased ROM, decreased strength, increased fascial restrictions, increased muscle spasms, improper body mechanics, postural dysfunction, and pain.    ACTIVITY LIMITATIONS: carrying, lifting, reach over head, and weight lifting/ sporting activities    PARTICIPATION LIMITATIONS: community activity, occupation, and yard  work   PERSONAL FACTORS: Time since onset of injury/illness/exacerbation and 1-2 comorbidities: DM, Cervical Myelopathy,   are also affecting patient's functional outcome.    REHAB POTENTIAL: Good   CLINICAL DECISION MAKING: Evolving/moderate complexity   EVALUATION COMPLEXITY: Moderate     GOALS: Goals reviewed with patient? Yes   SHORT TERM GOALS: Target date: UPDATED  04/26/2023     Pt to be IND with initial HEP for therapeutic progression Baseline:  Goal status: met 02/21/2023   2.  Reduce upper trap and surrounding muscle spasm to promote cervical ROM and reduce pain to </= 6/10 pain at max Baseline:  Goal status: ongoing 03/29/2023   3.  Increase DNF hold to >/= 45 seconds to demonstrate improvement in DNF strength/ endurance Baseline:  Goal status: ongoing 03/29/2023   4.  Pt to be able to verbalize/ demo efficient posture and lifting mechanics to reduce and  prevent neck pain.  Baseline:  Goal status: partially met 03/29/2023      LONG TERM GOALS: Target date: UPDATED 05/24/2023      Increase cervical extension/ rotation and side bending by >/= 10 degrees in all planes to promote functional ROM required for ADLS and safety with driving with </= 6/96 max pain Baseline:  Goal status: Not met   2.  Increase bil UE gross strength to >/= 4+/5 to assist with ADLs, and lifting items from an overhead shelf with </= min difficulty Baseline:  Goal status: not met   3.  Increase DNF endurance to >/= 60 seconds for cervical strength/ endurance Baseline:  Goal status: not met   4.  Pt to be able to return to self exercise program per his personal goals.  Baseline:  Goal status: not met   5.  Increaes FOTO score to >/= 56% to demo improvement in overall function Baseline: initial score 36% Goal status: not met   6.  Pt to be IND with all HEP and is able to maintain and progress their current LOF IND.  Baseline:  Goal status: not met  7.  Reduce paraspinal tightness to promote trunk mobility and reduce pain to </=5/10 Baseline:  Goal status: not met  8.  Pt to be able to sit, stand and walk for >/= 60 min with max report of 5/10 for functional endurance required for ALDS and work related tasks.  Baseline:  Goal status: not met    PLAN:   PT FREQUENCY: 1-2x/week   PT DURATION: 8 weeks   PLANNED INTERVENTIONS: Therapeutic exercises, Therapeutic activity, Neuromuscular re-education, Balance training, Gait training, Patient/Family education, Self Care, Joint mobilization, Joint manipulation, Dry Needling, Spinal mobilization, Cryotherapy, Moist heat, Taping, Traction, Ultrasound, Ionotophoresis 4mg /ml Dexamethasone, Manual therapy, and Re-evaluation   PLAN FOR NEXT SESSION:  review/ update HEP PRN.  Response to cervical mech traction, STW along upper trap/ levator/ cervical paraspinals/ sub-occipitals. Cervical mobs. Posture education.  Posterior chain activation, upper trap/ pec stretching.  Back potential for SIJ involvement with anterior deficit hamsring stretching, SLR/ flexor METs. STW along lumbar paraspinals.  Jasmeen Fritsch PT, DPT, LAT, ATC  04/26/23  10:55 AM

## 2023-04-28 ENCOUNTER — Ambulatory Visit: Payer: Commercial Managed Care - HMO

## 2023-05-03 ENCOUNTER — Ambulatory Visit: Payer: Commercial Managed Care - HMO | Admitting: Physical Therapy

## 2023-05-05 ENCOUNTER — Ambulatory Visit: Payer: Commercial Managed Care - HMO

## 2023-05-08 ENCOUNTER — Encounter: Payer: Self-pay | Admitting: Internal Medicine

## 2023-05-08 DIAGNOSIS — G8929 Other chronic pain: Secondary | ICD-10-CM

## 2023-05-08 DIAGNOSIS — M47812 Spondylosis without myelopathy or radiculopathy, cervical region: Secondary | ICD-10-CM

## 2023-05-08 DIAGNOSIS — I700 Atherosclerosis of aorta: Secondary | ICD-10-CM | POA: Insufficient documentation

## 2023-05-08 DIAGNOSIS — J449 Chronic obstructive pulmonary disease, unspecified: Secondary | ICD-10-CM | POA: Insufficient documentation

## 2023-05-10 ENCOUNTER — Ambulatory Visit: Payer: Commercial Managed Care - HMO | Admitting: Physical Therapy

## 2023-05-12 ENCOUNTER — Ambulatory Visit: Payer: Commercial Managed Care - HMO

## 2023-05-15 ENCOUNTER — Ambulatory Visit: Payer: Commercial Managed Care - HMO | Admitting: Physical Medicine and Rehabilitation

## 2023-05-16 ENCOUNTER — Encounter (HOSPITAL_COMMUNITY): Payer: Self-pay

## 2023-05-17 ENCOUNTER — Telehealth (HOSPITAL_COMMUNITY): Payer: Self-pay | Admitting: Emergency Medicine

## 2023-05-17 ENCOUNTER — Telehealth (HOSPITAL_COMMUNITY): Payer: Self-pay | Admitting: *Deleted

## 2023-05-17 NOTE — Telephone Encounter (Signed)
Reaching out to patient to offer assistance regarding upcoming cardiac imaging study; pt verbalizes understanding of appt date/time, parking situation and where to check in, pre-test NPO status and medications ordered, and verified current allergies; name and call back number provided for further questions should they arise Yves Fodor RN Navigator Cardiac Imaging [facility] [phone] office [phone] cell 

## 2023-05-17 NOTE — Telephone Encounter (Signed)
Attempted to call patient regarding upcoming cardiac CT appointment. Unable to leave VM. Johney Frame RN Navigator Cardiac Imaging [facility] [phone] Office

## 2023-05-19 ENCOUNTER — Ambulatory Visit (HOSPITAL_COMMUNITY): Admission: RE | Admit: 2023-05-19 | Payer: Commercial Managed Care - HMO | Source: Ambulatory Visit

## 2023-05-25 ENCOUNTER — Encounter (HOSPITAL_COMMUNITY): Payer: Self-pay

## 2023-05-25 ENCOUNTER — Telehealth (HOSPITAL_COMMUNITY): Payer: Self-pay | Admitting: *Deleted

## 2023-05-25 NOTE — Telephone Encounter (Signed)
Attempted to call patient regarding upcoming cardiac CT appointment. °Left message on voicemail with name and callback number ° °Zamyah Wiesman RN Navigator Cardiac Imaging °[facility] °[phone] Office °[phone] Cell ° °

## 2023-05-26 ENCOUNTER — Ambulatory Visit (HOSPITAL_COMMUNITY)
Admission: RE | Admit: 2023-05-26 | Discharge: 2023-05-26 | Disposition: A | Discharge status: Home / Self Care | Payer: Commercial Managed Care - HMO | Source: Ambulatory Visit | Attending: Cardiovascular Disease | Admitting: Cardiovascular Disease

## 2023-05-26 ENCOUNTER — Other Ambulatory Visit (HOSPITAL_COMMUNITY): Payer: Commercial Managed Care - HMO

## 2023-05-26 ENCOUNTER — Other Ambulatory Visit (HOSPITAL_COMMUNITY): Payer: Self-pay | Admitting: *Deleted

## 2023-05-26 ENCOUNTER — Ambulatory Visit (INDEPENDENT_AMBULATORY_CARE_PROVIDER_SITE_OTHER): Payer: Commercial Managed Care - HMO | Admitting: Physical Medicine and Rehabilitation

## 2023-05-26 DIAGNOSIS — M5416 Radiculopathy, lumbar region: Secondary | ICD-10-CM | POA: Diagnosis not present

## 2023-05-26 DIAGNOSIS — M5412 Radiculopathy, cervical region: Secondary | ICD-10-CM

## 2023-05-26 DIAGNOSIS — R072 Precordial pain: Secondary | ICD-10-CM | POA: Insufficient documentation

## 2023-05-26 MED ORDER — NITROGLYCERIN 0.4 MG SL SUBL
0.8000 mg | SUBLINGUAL_TABLET | Freq: Once | SUBLINGUAL | Status: AC
  Administered 2023-05-26: 0.8 mg via SUBLINGUAL

## 2023-05-26 MED ORDER — IOHEXOL 350 MG/ML SOLN
95.0000 mL | Freq: Once | INTRAVENOUS | Status: AC | PRN
  Administered 2023-05-26: 95 mL via INTRAVENOUS

## 2023-05-26 MED ORDER — METOPROLOL TARTRATE 100 MG PO TABS: ORAL_TABLET | ORAL | 0 refills | Status: DC

## 2023-05-26 MED ORDER — NITROGLYCERIN 0.4 MG SL SUBL
SUBLINGUAL_TABLET | SUBLINGUAL | Status: AC
  Filled 2023-05-26: qty 2
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
## 2023-05-28 ENCOUNTER — Encounter: Payer: Self-pay | Admitting: Physical Medicine and Rehabilitation

## 2023-06-08 ENCOUNTER — Ambulatory Visit: Payer: Commercial Managed Care - HMO | Admitting: Orthopedic Surgery

## 2023-06-15 ENCOUNTER — Ambulatory Visit (HOSPITAL_COMMUNITY)
Admission: EM | Admit: 2023-06-15 | Discharge: 2023-06-15 | Disposition: A | Discharge status: Home / Self Care | Payer: Commercial Managed Care - HMO | Attending: Family Medicine | Admitting: Family Medicine

## 2023-06-15 ENCOUNTER — Encounter (HOSPITAL_COMMUNITY): Payer: Self-pay

## 2023-06-15 DIAGNOSIS — R1030 Lower abdominal pain, unspecified: Secondary | ICD-10-CM | POA: Diagnosis not present

## 2023-06-15 DIAGNOSIS — R1031 Right lower quadrant pain: Secondary | ICD-10-CM

## 2023-06-15 DIAGNOSIS — R1032 Left lower quadrant pain: Secondary | ICD-10-CM | POA: Diagnosis not present

## 2023-06-15 NOTE — ED Provider Notes (Signed)
[facility]   [id number] 06/15/23 Arrival Time: 1022  ASSESSMENT & PLAN:  1. Lower abdominal pain    Offered to draw labs. He is unable to provide urine sample. Discussed the fact that I cannot do a CT scan here. Overall benign exam but unclear etiology of his lower abdominal pain.  After discussion, he prefers ED evaluation. Discharged to ED via POV; stable.   Follow-up Information     Go to  [facility] Emergency Department at [facility].   Specialty: Emergency Medicine Contact information: [address] [place] [address] [phone]               Reviewed expectations re: course of current medical issues. Questions answered. Outlined signs and symptoms indicating need for more acute intervention. Patient verbalized understanding. After Visit Summary given.   SUBJECTIVE: History from: patient. Trevor Wallace. is a 47 y.o. male who presents with complaint of non-radiating bilateral lower abdominal pain; intermittent; gradual onset; first noted approx 2 w ago; feels slight worsening over the past few days. Denies fever/n/v/d. Normal PO intake. Denies post-prandial pain/exacerbation. Denies back pain. Normal bowel/bladder habits.  No tx PTA. Denies blood in stool. History reviewed. No pertinent surgical history.   OBJECTIVE:  Vitals:   06/15/23 1107  BP: 91/60  Pulse: 84  Resp: 16  Temp: 97.9 F (36.6 C)  TempSrc: Oral  SpO2: 97%  Weight: 96.2 kg  Height: 5\' 9"  (1.753 m)    General appearance: alert, oriented, no acute distress HEENT: [place]; AT; oropharynx moist Lungs: unlabored respirations Abdomen: soft; without distention; mild  and poorly localized tenderness to palpation over lower abdomen; more laterally on both sides ; normal bowel sounds; without masses or organomegaly; without guarding or rebound tenderness Back: without reported CVA tenderness; FROM at waist Extremities: without LE edema; symmetrical;  without gross deformities Skin: warm and dry Neurologic: normal gait Psychological: alert and cooperative; normal mood and affect  Labs:  Labs Reviewed  POCT URINALYSIS DIP (MANUAL ENTRY)    Allergies  Allergen Reactions   Metformin And Related Other (See Comments)    Loose stools and bowel frequency                                               Past Medical History:  Diagnosis Date   Chest pain    Hypertension    Prediabetes     Social History   Socioeconomic History   Marital status: Significant Other    Spouse name: Not on file   Number of children: Not on file   Years of education: Not on file   Highest education level: Not on file  Occupational History   Not on file  Tobacco Use   Smoking status: Former    Types: Cigarettes   Smokeless tobacco: Never  Vaping Use   Vaping status: Never Used  Substance and Sexual Activity   Alcohol use: No    Alcohol/week: 0.0 standard drinks of alcohol   Drug use: No   Sexual activity: Yes  Other Topics Concern   Not on file  Social History Narrative   Right Handed    Lives in a one story home    Social Determinants of Health   Financial Resource Strain: Not on file  Food Insecurity: Not on file  Transportation Needs: Not on file  Physical Activity: Not on file  Stress: Not on file  Social Connections: Not on file  Intimate Partner Violence: Not on file    Family History  Problem Relation Age of Onset   Healthy Mother    Cancer Father    Diabetes Other      Mardella Layman, MD 06/15/23 1429

## 2023-06-15 NOTE — ED Notes (Signed)
Patient is being discharged from the Urgent Care and sent to the Emergency Department via POV . Per Dr Tracie Harrier, patient is in need of higher level of care due to sharp abdominal pain. Patient is aware and verbalizes understanding of plan of care.  Vitals:   06/15/23 1107  BP: 91/60  Pulse: 84  Resp: 16  Temp: 97.9 F (36.6 C)  SpO2: 97%
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
## 2023-06-18 ENCOUNTER — Other Ambulatory Visit: Payer: Commercial Managed Care - HMO

## 2023-06-30 ENCOUNTER — Emergency Department (HOSPITAL_COMMUNITY)
Admission: EM | Admit: 2023-06-30 | Discharge: 2023-06-30 | Disposition: A | Discharge status: Home / Self Care | Payer: Commercial Managed Care - HMO | Attending: Emergency Medicine | Admitting: Emergency Medicine

## 2023-06-30 ENCOUNTER — Emergency Department (HOSPITAL_COMMUNITY): Payer: Commercial Managed Care - HMO

## 2023-06-30 ENCOUNTER — Other Ambulatory Visit: Payer: Self-pay

## 2023-06-30 ENCOUNTER — Encounter (HOSPITAL_COMMUNITY): Payer: Self-pay | Admitting: Emergency Medicine

## 2023-06-30 DIAGNOSIS — M5431 Sciatica, right side: Secondary | ICD-10-CM

## 2023-06-30 DIAGNOSIS — K4090 Unilateral inguinal hernia, without obstruction or gangrene, not specified as recurrent: Secondary | ICD-10-CM

## 2023-06-30 DIAGNOSIS — I10 Essential (primary) hypertension: Secondary | ICD-10-CM | POA: Diagnosis not present

## 2023-06-30 DIAGNOSIS — R1032 Left lower quadrant pain: Secondary | ICD-10-CM | POA: Diagnosis present

## 2023-06-30 LAB — LIPASE, BLOOD: Lipase: 34 U/L (ref 11–51)

## 2023-06-30 LAB — COMPREHENSIVE METABOLIC PANEL
ALT: 39 U/L (ref 0–44)
AST: 24 U/L (ref 15–41)
Albumin: 3.3 g/dL — ABNORMAL LOW (ref 3.5–5.0)
Alkaline Phosphatase: 70 U/L (ref 38–126)
Anion gap: 8 (ref 5–15)
BUN: 8 mg/dL (ref 6–20)
CO2: 22 mmol/L (ref 22–32)
Calcium: 8.4 mg/dL — ABNORMAL LOW (ref 8.9–10.3)
Chloride: 109 mmol/L (ref 98–111)
Creatinine, Ser: 1.13 mg/dL (ref 0.61–1.24)
GFR, Estimated: >60 mL/min (ref >60)
Glucose, Bld: 124 mg/dL — ABNORMAL HIGH (ref 70–99)
Potassium: 3.9 mmol/L (ref 3.5–5.1)
Sodium: 139 mmol/L (ref 135–145)
Total Bilirubin: 0.4 mg/dL (ref 0.3–1.2)
Total Protein: 5.9 g/dL — ABNORMAL LOW (ref 6.5–8.1)

## 2023-06-30 LAB — URINALYSIS, ROUTINE W REFLEX MICROSCOPIC
Bilirubin Urine: NEGATIVE
Glucose, UA: NEGATIVE mg/dL
Hgb urine dipstick: NEGATIVE
Ketones, ur: NEGATIVE mg/dL
Leukocytes,Ua: NEGATIVE
Nitrite: NEGATIVE
Protein, ur: NEGATIVE mg/dL
Specific Gravity, Urine: 1.017 (ref 1.005–1.030)
pH: 5 (ref 5.0–8.0)

## 2023-06-30 LAB — CBC
HCT: 43.8 % (ref 39.0–52.0)
Hemoglobin: 13.3 g/dL (ref 13.0–17.0)
MCH: 21.5 pg — ABNORMAL LOW (ref 26.0–34.0)
MCHC: 30.4 g/dL (ref 30.0–36.0)
MCV: 70.9 fL — ABNORMAL LOW (ref 80.0–100.0)
Platelets: 332 10*3/uL (ref 150–400)
RBC: 6.18 MIL/uL — ABNORMAL HIGH (ref 4.22–5.81)
RDW: 17.9 % — ABNORMAL HIGH (ref 11.5–15.5)
WBC: 8.2 10*3/uL (ref 4.0–10.5)
nRBC: 0 % (ref 0.0–0.2)

## 2023-06-30 MED ORDER — PREDNISONE 10 MG PO TABS: 20.0000 mg | ORAL_TABLET | Freq: Every day | ORAL | 0 refills | Status: AC

## 2023-06-30 MED ORDER — IBUPROFEN 800 MG PO TABS: 800.0000 mg | ORAL_TABLET | Freq: Three times a day (TID) | ORAL | 0 refills | Status: DC

## 2023-06-30 MED ORDER — IOHEXOL 350 MG/ML SOLN
75.0000 mL | Freq: Once | INTRAVENOUS | Status: AC | PRN
  Administered 2023-06-30: 75 mL via INTRAVENOUS

## 2023-06-30 NOTE — Discharge Instructions (Signed)
Please call make an appointment for repair of left inguinal hernia if symptoms continue to bother you.

## 2023-06-30 NOTE — ED Notes (Signed)
Pt verbalized understanding of discharge instructions. Pt ambulated from ed with steady gait.  

## 2023-06-30 NOTE — ED Triage Notes (Signed)
Patient reports pain across lower abdomen for 2 weeks , no emesis or diarrhea , denies fever or chills . Occasional mild dizziness.

## 2023-06-30 NOTE — ED Provider Notes (Signed)
[facility]ERGENC[facility] DEPARTMENT AT [facility] Provider Note   CSN: [id number] Arrival date & time: 06/30/23  4098     History {Add pertinent medical, surgical, social history, OB history to HPI:1} Chief Complaint  Patient presents with   Abdominal Pain    Trevor Wallace. is a 47 y.o. male.  47 year old male with past medical history of hypertension presenting for complaints of abdominal pain.  Patient admits to bilateral lower abdominal pain for 2 weeks.  Pain is radiating to the right medial proximal thigh.  Denies fevers, chills, nausea, vomiting.  Denies diarrhea.  Last bowel movement was yesterday and described as soft.  Denies black or bloody stools.  The history is provided by the patient. No language interpreter was used.  Abdominal Pain Associated symptoms: no chest pain, no chills, no cough, no dysuria, no fever, no hematuria, no shortness of breath, no sore throat and no vomiting        Home Medications Prior to Admission medications   Medication Sig Start Date End Date Taking? Authorizing Provider  cyclobenzaprine (FLEXERIL) 10 MG tablet Take 1 tablet (10 mg total) by mouth 2 (two) times daily as needed for muscle spasms. 02/23/23   Rising, Lurena Joiner, PA-C  gabapentin (NEURONTIN) 100 MG capsule Take 1 capsule (100 mg total) by mouth 3 (three) times daily. 03/22/23   Corwin Levins, MD  traMADol (ULTRAM) 50 MG tablet Take 1 tablet (50 mg total) by mouth every 6 (six) hours as needed. 03/22/23   Corwin Levins, MD      Allergies    Metformin and related    Review of Systems   Review of Systems  Constitutional:  Negative for chills and fever.  HENT:  Negative for ear pain and sore throat.   Eyes:  Negative for pain and visual disturbance.  Respiratory:  Negative for cough and shortness of breath.   Cardiovascular:  Negative for chest pain and palpitations.  Gastrointestinal:  Positive for abdominal pain. Negative for vomiting.  Genitourinary:  Negative for  dysuria and hematuria.  Musculoskeletal:  Negative for arthralgias and back pain.  Skin:  Negative for color change and rash.  Neurological:  Negative for seizures and syncope.  All other systems reviewed and are negative.   Physical Exam Updated Vital Signs BP (!) 132/90 (BP Location: Right Arm)   Pulse 80   Temp 98.1 F (36.7 C)   Resp 16   SpO2 97%  Physical Exam Vitals and nursing note reviewed.  Constitutional:      General: He is not in acute distress.    Appearance: He is well-developed.  HENT:     Head: Normocephalic and atraumatic.  Eyes:     Conjunctiva/sclera: Conjunctivae normal.  Cardiovascular:     Rate and Rhythm: Normal rate and regular rhythm.     Heart sounds: No murmur heard. Pulmonary:     Effort: Pulmonary effort is normal. No respiratory distress.     Breath sounds: Normal breath sounds.  Abdominal:     Palpations: Abdomen is soft.     Tenderness: There is abdominal tenderness in the right lower quadrant and left lower quadrant. There is no guarding or rebound.  Musculoskeletal:        General: No swelling.     Cervical back: Neck supple.  Skin:    General: Skin is warm and dry.     Capillary Refill: Capillary refill takes less than 2 seconds.  Neurological:     Mental Status:  He is alert.  Psychiatric:        Mood and Affect: Mood normal.     ED Results / Procedures / Treatments   Labs (all labs ordered are listed, but only abnormal results are displayed) Labs Reviewed  COMPREHENSIVE METABOLIC PANEL - Abnormal; Notable for the following components:      Result Value   Glucose, Bld 124 (*)    Calcium 8.4 (*)    Total Protein 5.9 (*)    Albumin 3.3 (*)    All other components within normal limits  CBC - Abnormal; Notable for the following components:   RBC 6.18 (*)    MCV 70.9 (*)    MCH 21.5 (*)    RDW 17.9 (*)    All other components within normal limits  LIPASE, BLOOD  URINALYSIS, ROUTINE W REFLEX MICROSCOPIC     EKG None  Radiology No results found.  Procedures Procedures  {Document cardiac monitor, telemetry assessment procedure when appropriate:1}  Medications Ordered in ED Medications - No data to display  ED Course/ Medical Decision Making/ A&P   {   Click here for ABCD2, HEART and other calculatorsREFRESH Note before signing :1}                              Medical Decision Making Amount and/or Complexity of Data Reviewed Labs: ordered. Radiology: ordered.   19:58 AM 47 year old male with past medical history of hypertension presenting for complaints of abdominal pain.  {Document critical care time when appropriate:1} {Document review of labs and clinical decision tools ie heart score, Chads2Vasc2 etc:1}  {Document your independent review of radiology images, and any outside records:1} {Document your discussion with family members, caretakers, and with consultants:1} {Document social determinants of health affecting pt's care:1} {Document your decision making why or why not admission, treatments were needed:1} Final Clinical Impression(s) / ED Diagnoses Final diagnoses:  None    Rx / DC Orders ED Discharge Orders     None

## 2023-08-14 ENCOUNTER — Encounter: Payer: Self-pay | Admitting: Internal Medicine

## 2023-08-24 ENCOUNTER — Ambulatory Visit: Payer: Managed Care, Other (non HMO) | Admitting: Internal Medicine

## 2023-08-28 ENCOUNTER — Ambulatory Visit: Payer: Managed Care, Other (non HMO) | Admitting: Internal Medicine

## 2023-12-19 DIAGNOSIS — R059 Cough, unspecified: Secondary | ICD-10-CM | POA: Diagnosis not present

## 2023-12-19 DIAGNOSIS — J018 Other acute sinusitis: Secondary | ICD-10-CM | POA: Diagnosis not present

## 2023-12-25 ENCOUNTER — Ambulatory Visit: Payer: Self-pay | Admitting: General Surgery

## 2024-01-02 DIAGNOSIS — E6609 Other obesity due to excess calories: Secondary | ICD-10-CM | POA: Diagnosis not present

## 2024-01-02 DIAGNOSIS — Z125 Encounter for screening for malignant neoplasm of prostate: Secondary | ICD-10-CM | POA: Diagnosis not present

## 2024-01-02 DIAGNOSIS — Z0000 Encounter for general adult medical examination without abnormal findings: Secondary | ICD-10-CM | POA: Diagnosis not present

## 2024-01-02 DIAGNOSIS — Z6832 Body mass index (BMI) 32.0-32.9, adult: Secondary | ICD-10-CM | POA: Diagnosis not present

## 2024-01-15 NOTE — Pre-Procedure Instructions (Signed)
 Surgical Instructions   Your procedure is scheduled on January 24, 2024. Report to [facility] Main Entrance "A" at 8:15 A.M., then check in with the Admitting office. Any questions or running late day of surgery: call [phone]  Questions prior to your surgery date: call [phone], Monday-Friday, 8am-4pm. If you experience any cold or flu symptoms such as cough, fever, chills, shortness of breath, etc. between now and your scheduled surgery, please notify us at the above number.     Remember:  Do not eat after midnight the night before your surgery   You may drink clear liquids until 7:15 AM the morning of your surgery.   Clear liquids allowed are: Water, Non-Citrus Juices (without pulp), Carbonated Beverages, Clear Tea (no milk, honey, etc.), Black Coffee Only (NO MILK, CREAM OR POWDERED CREAMER of any kind), and Gatorade.  Patient Instructions  The night before surgery:  No food after midnight. ONLY clear liquids after midnight  The day of surgery (if you have diabetes): Drink ONE (1) 12 oz G2 given to you in your pre admission testing appointment by 7:15 AM the morning of surgery. Drink in one sitting. Do not sip.  This drink was given to you during your hospital  pre-op appointment visit.  Nothing else to drink after completing the  12 oz bottle of G2.         If you have questions, please contact your surgeon's office.    Take these medicines the morning of surgery with A SIP OF WATER: NONE   One week prior to surgery, STOP taking any Aspirin (unless otherwise instructed by your surgeon) Aleve, Naproxen, Ibuprofen, Motrin, Advil, Goody's, BC's, all herbal medications, fish oil, and non-prescription vitamins.                     Do NOT Smoke (Tobacco/Vaping) for 24 hours prior to your procedure.  If you use a CPAP at night, you may bring your mask/headgear for your overnight stay.   You will be asked to remove any contacts, glasses, piercing's, hearing aid's,  dentures/partials prior to surgery. Please bring cases for these items if needed.    Patients discharged the day of surgery will not be allowed to drive home, and someone needs to stay with them for 24 hours.  SURGICAL WAITING ROOM VISITATION Patients may have no more than 2 support people in the waiting area - these visitors may rotate.   Pre-op nurse will coordinate an appropriate time for 1 ADULT support person, who may not rotate, to accompany patient in pre-op.  Children under the age of 33 must have an adult with them who is not the patient and must remain in the main waiting area with an adult.  If the patient needs to stay at the hospital during part of their recovery, the visitor guidelines for inpatient rooms apply.  Please refer to the [facility] website for the visitor guidelines for any additional information.   If you received a COVID test during your pre-op visit  it is requested that you wear a mask when out in public, stay away from anyone that may not be feeling well and notify your surgeon if you develop symptoms. If you have been in contact with anyone that has tested positive in the last 10 days please notify you surgeon.      Pre-operative CHG Bathing Instructions   You can play a key role in reducing the risk of infection after surgery. Your skin needs to  be as free of germs as possible. You can reduce the number of germs on your skin by washing with CHG (chlorhexidine gluconate) soap before surgery. CHG is an antiseptic soap that kills germs and continues to kill germs even after washing.   DO NOT use if you have an allergy to chlorhexidine/CHG or antibacterial soaps. If your skin becomes reddened or irritated, stop using the CHG and notify one of our RNs at [phone].              TAKE A SHOWER THE NIGHT BEFORE SURGERY AND THE DAY OF SURGERY    Please keep in mind the following:  DO NOT shave, including legs and underarms, 48 hours prior to surgery.   You may  shave your face before/day of surgery.  Place clean sheets on your bed the night before surgery Use a clean washcloth (not used since being washed) for each shower. DO NOT sleep with pet's night before surgery.  CHG Shower Instructions:  Wash your face and private area with normal soap. If you choose to wash your hair, wash first with your normal shampoo.  After you use shampoo/soap, rinse your hair and body thoroughly to remove shampoo/soap residue.  Turn the water OFF and apply half the bottle of CHG soap to a CLEAN washcloth.  Apply CHG soap ONLY FROM YOUR NECK DOWN TO YOUR TOES (washing for 3-5 minutes)  DO NOT use CHG soap on face, private areas, open wounds, or sores.  Pay special attention to the area where your surgery is being performed.  If you are having back surgery, having someone wash your back for you may be helpful. Wait 2 minutes after CHG soap is applied, then you may rinse off the CHG soap.  Pat dry with a clean towel  Put on clean pajamas    Additional instructions for the day of surgery: DO NOT APPLY any lotions, deodorants, cologne, or perfumes.   Do not wear jewelry or makeup Do not wear nail polish, gel polish, artificial nails, or any other type of covering on natural nails (fingers and toes) Do not bring valuables to the hospital. [facility] is not responsible for valuables/personal belongings. Put on clean/comfortable clothes.  Please brush your teeth.  Ask your nurse before applying any prescription medications to the skin.

## 2024-01-16 ENCOUNTER — Encounter (HOSPITAL_COMMUNITY): Payer: Self-pay

## 2024-01-16 ENCOUNTER — Encounter (HOSPITAL_COMMUNITY)
Admission: RE | Admit: 2024-01-16 | Discharge: 2024-01-16 | Disposition: A | Discharge status: Home / Self Care | Payer: Commercial Managed Care - HMO | Source: Ambulatory Visit | Attending: General Surgery | Admitting: General Surgery

## 2024-01-16 ENCOUNTER — Other Ambulatory Visit: Payer: Self-pay

## 2024-01-16 VITALS — BP 134/79 | HR 78 | Temp 97.9°F | Resp 18 | Ht 68.0 in | Wt 231.7 lb

## 2024-01-16 DIAGNOSIS — K4020 Bilateral inguinal hernia, without obstruction or gangrene, not specified as recurrent: Secondary | ICD-10-CM | POA: Insufficient documentation

## 2024-01-16 DIAGNOSIS — R9431 Abnormal electrocardiogram [ECG] [EKG]: Secondary | ICD-10-CM | POA: Insufficient documentation

## 2024-01-16 DIAGNOSIS — Z01818 Encounter for other preprocedural examination: Secondary | ICD-10-CM | POA: Diagnosis present

## 2024-01-16 DIAGNOSIS — Z87891 Personal history of nicotine dependence: Secondary | ICD-10-CM | POA: Insufficient documentation

## 2024-01-16 DIAGNOSIS — M2578 Osteophyte, vertebrae: Secondary | ICD-10-CM | POA: Insufficient documentation

## 2024-01-16 DIAGNOSIS — M4802 Spinal stenosis, cervical region: Secondary | ICD-10-CM | POA: Insufficient documentation

## 2024-01-16 DIAGNOSIS — M47812 Spondylosis without myelopathy or radiculopathy, cervical region: Secondary | ICD-10-CM | POA: Insufficient documentation

## 2024-01-16 DIAGNOSIS — I10 Essential (primary) hypertension: Secondary | ICD-10-CM | POA: Diagnosis not present

## 2024-01-16 DIAGNOSIS — M5021 Other cervical disc displacement,  high cervical region: Secondary | ICD-10-CM | POA: Diagnosis not present

## 2024-01-16 DIAGNOSIS — M50322 Other cervical disc degeneration at C5-C6 level: Secondary | ICD-10-CM | POA: Diagnosis not present

## 2024-01-16 DIAGNOSIS — R7303 Prediabetes: Secondary | ICD-10-CM | POA: Insufficient documentation

## 2024-01-16 LAB — CBC
HCT: 41.9 % (ref 39.0–52.0)
Hemoglobin: 13.1 g/dL (ref 13.0–17.0)
MCH: 22 pg — ABNORMAL LOW (ref 26.0–34.0)
MCHC: 31.3 g/dL (ref 30.0–36.0)
MCV: 70.4 fL — ABNORMAL LOW (ref 80.0–100.0)
Platelets: 331 10*3/uL (ref 150–400)
RBC: 5.95 MIL/uL — ABNORMAL HIGH (ref 4.22–5.81)
RDW: 17.4 % — ABNORMAL HIGH (ref 11.5–15.5)
WBC: 7.2 10*3/uL (ref 4.0–10.5)
nRBC: 0 % (ref 0.0–0.2)

## 2024-01-16 LAB — BASIC METABOLIC PANEL
Anion gap: 6 (ref 5–15)
BUN: 16 mg/dL (ref 6–20)
CO2: 28 mmol/L (ref 22–32)
Calcium: 9 mg/dL (ref 8.9–10.3)
Chloride: 105 mmol/L (ref 98–111)
Creatinine, Ser: 0.81 mg/dL (ref 0.61–1.24)
GFR, Estimated: >60 mL/min (ref >60)
Glucose, Bld: 120 mg/dL — ABNORMAL HIGH (ref 70–99)
Potassium: 4 mmol/L (ref 3.5–5.1)
Sodium: 139 mmol/L (ref 135–145)

## 2024-01-16 LAB — HEMOGLOBIN A1C
Hgb A1c MFr Bld: 6.6 % — ABNORMAL HIGH (ref 4.8–5.6)
Mean Plasma Glucose: 142.72 mg/dL

## 2024-01-16 NOTE — Progress Notes (Signed)
 PCP - Lucillie Garfinkel  Cardiologist - denies but has seen Charlton Haws for chest pain - last seen 02/09/23  PPM/ICD - denies Device Orders - n/a Rep Notified - n/a  Chest x-ray - denies EKG - 01/16/24 Stress Test - denies ECHO - denies Cardiac Cath - denies Coronary CT - 7/24  Sleep Study - denies CPAP - n/a  Patient states that he has pre-diabetes and does not check his blood sugar at home.    Last dose of GLP1 agonist-  n/a GLP1 instructions: n/a  Blood Thinner Instructions: n/a Aspirin Instructions: n/a  ERAS Protcol - clears until 0715 PRE-SURGERY Ensure or G2- G2 as ordered  COVID TEST- n/a   Anesthesia review: yes - chest pain, record requests from PCP,  Patient denies shortness of breath, fever, cough and chest pain at PAT appointment, cervical stenosis   All instructions explained to the patient, with a verbal understanding of the material. Patient agrees to go over the instructions while at home for a better understanding. Patient also instructed to self quarantine after being tested for COVID-19. The opportunity to ask questions was provided.

## 2024-01-17 ENCOUNTER — Encounter (HOSPITAL_COMMUNITY): Payer: Self-pay
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
## 2024-01-17 NOTE — Anesthesia Preprocedure Evaluation (Addendum)
 Anesthesia Evaluation  Patient identified by MRN, date of birth, ID band Patient awake    Reviewed: Allergy & Precautions, H&P , NPO status , Patient's Chart, lab work & pertinent test results  Airway Mallampati: III  TM Distance: >3 FB Neck ROM: Full    Dental  (+) Teeth Intact, Dental Advisory Given   Pulmonary COPD, former smoker Quit smoking 7 mo ago     Pulmonary exam normal breath sounds clear to auscultation       Cardiovascular hypertension (no home meds, 134/94 recently prescribed an antihypertensive but hasnt started it  yet), Normal cardiovascular exam Rhythm:Regular Rate:Normal     Neuro/Psych  Headaches MRI C-spine 08/10/21: IMPRESSION: - 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 - At C3-C4, a cranially migrated central disc extrusion contributes to multifactorial severe spinal canal stenosis with significant spinal cord flattening. T2 hyperintense signal abnormality within the spinal cord at this level compatible with focal edema and/or myelomalacia. Bilateral neural foraminal narrowing (moderate/severe right, mild left). - At C4-C5, a cranially migrated broad-based central disc extrusion results in moderate/severe spinal canal stenosis with mild spinal cord flattening. - At C5-C6, a disc bulge contributes to mild/moderate spinal canal narrowing with contact upon the dorsal and ventral spinal cord. Mild relative right neural foraminal narrowing. - At C6-C7, a posterior disc osteophyte complex contributes to mild relative spinal canal narrowing, and moderate/severe bilateral neural foraminal narrowing. - Straightening of the expected cervical lordosis.   negative psych ROS   GI/Hepatic negative GI ROS, Neg liver ROS,,,  Endo/Other  diabetes (prediabetic)    Renal/GU negative Renal ROS  negative  genitourinary   Musculoskeletal  (+) Arthritis , Osteoarthritis,    Abdominal   Peds negative pediatric ROS (+)  Hematology negative hematology ROS (+) Hb 13.1   Anesthesia Other Findings   Reproductive/Obstetrics negative OB ROS                             Anesthesia Physical Anesthesia Plan  ASA: 2  Anesthesia Plan: General   Post-op Pain Management: Tylenol PO (pre-op)* and Ketamine IV*   Induction: Intravenous  PONV Risk Score and Plan: 3 and Ondansetron, Dexamethasone, Midazolam and Treatment may vary due to age or medical condition  Airway Management Planned: Oral ETT and Video Laryngoscope Planned  Additional Equipment: None  Intra-op Plan:   Post-operative Plan: Extubation in OR  Informed Consent: I have reviewed the patients History and Physical, chart, labs and discussed the procedure including the risks, benefits and alternatives for the proposed anesthesia with the patient or authorized representative who has indicated his/her understanding and acceptance.     Dental advisory given  Plan Discussed with: CRNA  Anesthesia Plan Comments: ( )       Anesthesia Quick Evaluation

## 2024-01-21 DIAGNOSIS — E6609 Other obesity due to excess calories: Secondary | ICD-10-CM | POA: Diagnosis not present

## 2024-01-21 DIAGNOSIS — Z6832 Body mass index (BMI) 32.0-32.9, adult: Secondary | ICD-10-CM | POA: Diagnosis not present

## 2024-01-21 DIAGNOSIS — E119 Type 2 diabetes mellitus without complications: Secondary | ICD-10-CM | POA: Diagnosis not present

## 2024-01-21 DIAGNOSIS — E7800 Pure hypercholesterolemia, unspecified: Secondary | ICD-10-CM | POA: Diagnosis not present

## 2024-01-24 ENCOUNTER — Encounter (HOSPITAL_COMMUNITY)
Admission: RE | Disposition: A | Discharge status: Home / Self Care | Payer: Self-pay | Source: Home / Self Care | Attending: General Surgery

## 2024-01-24 ENCOUNTER — Ambulatory Visit (HOSPITAL_COMMUNITY)
Admission: RE | Admit: 2024-01-24 | Discharge: 2024-01-24 | Disposition: A | Discharge status: Home / Self Care | Payer: Commercial Managed Care - HMO | Attending: General Surgery | Admitting: General Surgery

## 2024-01-24 ENCOUNTER — Ambulatory Visit (HOSPITAL_COMMUNITY): Payer: Self-pay | Admitting: Vascular Surgery

## 2024-01-24 ENCOUNTER — Ambulatory Visit (HOSPITAL_BASED_OUTPATIENT_CLINIC_OR_DEPARTMENT_OTHER): Payer: Self-pay | Admitting: Vascular Surgery

## 2024-01-24 ENCOUNTER — Other Ambulatory Visit: Payer: Self-pay

## 2024-01-24 DIAGNOSIS — K4020 Bilateral inguinal hernia, without obstruction or gangrene, not specified as recurrent: Secondary | ICD-10-CM | POA: Diagnosis present

## 2024-01-24 DIAGNOSIS — Z87891 Personal history of nicotine dependence: Secondary | ICD-10-CM | POA: Diagnosis not present

## 2024-01-24 DIAGNOSIS — G8929 Other chronic pain: Secondary | ICD-10-CM | POA: Insufficient documentation

## 2024-01-24 DIAGNOSIS — M1990 Unspecified osteoarthritis, unspecified site: Secondary | ICD-10-CM | POA: Diagnosis not present

## 2024-01-24 DIAGNOSIS — J449 Chronic obstructive pulmonary disease, unspecified: Secondary | ICD-10-CM | POA: Insufficient documentation

## 2024-01-24 DIAGNOSIS — I10 Essential (primary) hypertension: Secondary | ICD-10-CM | POA: Diagnosis not present

## 2024-01-24 DIAGNOSIS — R519 Headache, unspecified: Secondary | ICD-10-CM | POA: Insufficient documentation

## 2024-01-24 HISTORY — PX: INGUINAL HERNIA REPAIR: SHX194

## 2024-01-24 LAB — GLUCOSE, CAPILLARY: Glucose-Capillary: 98 mg/dL (ref 70–99)

## 2024-01-24 SURGERY — REPAIR, HERNIA, INGUINAL, LAPAROSCOPIC
Anesthesia: General | Site: Abdomen | Laterality: Bilateral

## 2024-01-24 MED ORDER — ENSURE PRE-SURGERY PO LIQD: 296.0000 mL | Freq: Once | ORAL | Status: DC

## 2024-01-24 MED ORDER — PROPOFOL 10 MG/ML IV BOLUS
INTRAVENOUS | Status: DC | PRN
  Administered 2024-01-24: 200 mg via INTRAVENOUS

## 2024-01-24 MED ORDER — CEFAZOLIN SODIUM-DEXTROSE 2-4 GM/100ML-% IV SOLN
INTRAVENOUS | Status: AC
  Filled 2024-01-24: qty 100

## 2024-01-24 MED ORDER — ACETAMINOPHEN 500 MG PO TABS
ORAL_TABLET | ORAL | Status: AC
  Administered 2024-01-24: 1000 mg via ORAL
  Filled 2024-01-24: qty 2

## 2024-01-24 MED ORDER — METHOCARBAMOL 500 MG PO TABS: 500.0000 mg | ORAL_TABLET | Freq: Once | ORAL | Status: DC

## 2024-01-24 MED ORDER — ONDANSETRON HCL 4 MG/2ML IJ SOLN
INTRAMUSCULAR | Status: DC | PRN
  Administered 2024-01-24: 4 mg via INTRAVENOUS

## 2024-01-24 MED ORDER — LIDOCAINE 2% (20 MG/ML) 5 ML SYRINGE
INTRAMUSCULAR | Status: AC
  Filled 2024-01-24: qty 5

## 2024-01-24 MED ORDER — OXYCODONE HCL 5 MG PO TABS: 5.0000 mg | ORAL_TABLET | Freq: Once | ORAL | Status: DC | PRN

## 2024-01-24 MED ORDER — TRAMADOL HCL 50 MG PO TABS: 50.0000 mg | ORAL_TABLET | Freq: Four times a day (QID) | ORAL | 0 refills | Status: AC | PRN

## 2024-01-24 MED ORDER — ACETAMINOPHEN 500 MG PO TABS: 1000.0000 mg | ORAL_TABLET | ORAL | Status: AC

## 2024-01-24 MED ORDER — CEFAZOLIN SODIUM-DEXTROSE 2-4 GM/100ML-% IV SOLN
2.0000 g | INTRAVENOUS | Status: AC
  Administered 2024-01-24: 2 g via INTRAVENOUS

## 2024-01-24 MED ORDER — LIDOCAINE 2% (20 MG/ML) 5 ML SYRINGE
INTRAMUSCULAR | Status: DC | PRN
  Administered 2024-01-24: 60 mg via INTRAVENOUS

## 2024-01-24 MED ORDER — ROCURONIUM BROMIDE 10 MG/ML (PF) SYRINGE
PREFILLED_SYRINGE | INTRAVENOUS | Status: DC | PRN
  Administered 2024-01-24: 80 mg via INTRAVENOUS

## 2024-01-24 MED ORDER — KETOROLAC TROMETHAMINE 30 MG/ML IJ SOLN
INTRAMUSCULAR | Status: AC
  Filled 2024-01-24: qty 1

## 2024-01-24 MED ORDER — FENTANYL CITRATE (PF) 250 MCG/5ML IJ SOLN
INTRAMUSCULAR | Status: DC | PRN
  Administered 2024-01-24 (×2): 50 ug via INTRAVENOUS
  Administered 2024-01-24: 100 ug via INTRAVENOUS
  Administered 2024-01-24: 50 ug via INTRAVENOUS

## 2024-01-24 MED ORDER — OXYCODONE HCL 5 MG/5ML PO SOLN: 5.0000 mg | Freq: Once | ORAL | Status: DC | PRN

## 2024-01-24 MED ORDER — MEPERIDINE HCL 25 MG/ML IJ SOLN: 6.2500 mg | INTRAMUSCULAR | Status: DC | PRN

## 2024-01-24 MED ORDER — LACTATED RINGERS IV SOLN: INTRAVENOUS | Status: DC

## 2024-01-24 MED ORDER — DIPHENHYDRAMINE HCL 50 MG/ML IJ SOLN
25.0000 mg | INTRAMUSCULAR | Status: DC | PRN
Start: 2024-01-24 — End: 2024-01-24
  Administered 2024-01-24: 25 mg via INTRAVENOUS

## 2024-01-24 MED ORDER — ORAL CARE MOUTH RINSE: 15.0000 mL | Freq: Once | OROMUCOSAL | Status: AC

## 2024-01-24 MED ORDER — CHLORHEXIDINE GLUCONATE CLOTH 2 % EX PADS: 6.0000 | MEDICATED_PAD | Freq: Once | CUTANEOUS | Status: DC

## 2024-01-24 MED ORDER — ROCURONIUM BROMIDE 10 MG/ML (PF) SYRINGE
PREFILLED_SYRINGE | INTRAVENOUS | Status: AC
  Filled 2024-01-24: qty 10

## 2024-01-24 MED ORDER — DEXAMETHASONE SODIUM PHOSPHATE 10 MG/ML IJ SOLN
INTRAMUSCULAR | Status: AC
  Filled 2024-01-24: qty 1

## 2024-01-24 MED ORDER — SUGAMMADEX SODIUM 200 MG/2ML IV SOLN
INTRAVENOUS | Status: DC | PRN
Start: 2024-01-24 — End: 2024-01-24
  Administered 2024-01-24: 200 mg via INTRAVENOUS

## 2024-01-24 MED ORDER — BUPIVACAINE HCL 0.25 % IJ SOLN
INTRAMUSCULAR | Status: DC | PRN
  Administered 2024-01-24: 7 mL

## 2024-01-24 MED ORDER — DEXAMETHASONE SODIUM PHOSPHATE 10 MG/ML IJ SOLN
INTRAMUSCULAR | Status: DC | PRN
  Administered 2024-01-24: 10 mg via INTRAVENOUS

## 2024-01-24 MED ORDER — HYDROMORPHONE HCL 1 MG/ML IJ SOLN
0.2500 mg | INTRAMUSCULAR | Status: DC | PRN
  Administered 2024-01-24: 0.25 mg via INTRAVENOUS
  Administered 2024-01-24: 0.5 mg via INTRAVENOUS
  Administered 2024-01-24: 0.25 mg via INTRAVENOUS

## 2024-01-24 MED ORDER — ACETAMINOPHEN 500 MG PO TABS: 1000.0000 mg | ORAL_TABLET | Freq: Once | ORAL | Status: DC

## 2024-01-24 MED ORDER — KETOROLAC TROMETHAMINE 30 MG/ML IJ SOLN
30.0000 mg | Freq: Once | INTRAMUSCULAR | Status: AC | PRN
  Administered 2024-01-24: 30 mg via INTRAVENOUS

## 2024-01-24 MED ORDER — FENTANYL CITRATE (PF) 250 MCG/5ML IJ SOLN
INTRAMUSCULAR | Status: AC
  Filled 2024-01-24: qty 5

## 2024-01-24 MED ORDER — MIDAZOLAM HCL 2 MG/2ML IJ SOLN
INTRAMUSCULAR | Status: AC
  Filled 2024-01-24: qty 2

## 2024-01-24 MED ORDER — BUPIVACAINE HCL (PF) 0.25 % IJ SOLN
INTRAMUSCULAR | Status: AC
  Filled 2024-01-24: qty 30

## 2024-01-24 MED ORDER — MIDAZOLAM HCL 2 MG/2ML IJ SOLN
INTRAMUSCULAR | Status: DC | PRN
  Administered 2024-01-24: 2 mg via INTRAVENOUS

## 2024-01-24 MED ORDER — ONDANSETRON HCL 4 MG/2ML IJ SOLN
INTRAMUSCULAR | Status: AC
  Filled 2024-01-24: qty 2

## 2024-01-24 MED ORDER — HYDROMORPHONE HCL 1 MG/ML IJ SOLN
INTRAMUSCULAR | Status: AC
  Filled 2024-01-24: qty 1

## 2024-01-24 MED ORDER — SUGAMMADEX SODIUM 200 MG/2ML IV SOLN
INTRAVENOUS | Status: AC
  Filled 2024-01-24: qty 2

## 2024-01-24 MED ORDER — DIPHENHYDRAMINE HCL 50 MG/ML IJ SOLN
INTRAMUSCULAR | Status: AC
  Filled 2024-01-24: qty 1

## 2024-01-24 MED ORDER — CHLORHEXIDINE GLUCONATE 0.12 % MT SOLN
OROMUCOSAL | Status: AC
  Administered 2024-01-24: 15 mL via OROMUCOSAL
  Filled 2024-01-24: qty 15

## 2024-01-24 MED ORDER — PROPOFOL 10 MG/ML IV BOLUS
INTRAVENOUS | Status: AC
  Filled 2024-01-24: qty 20

## 2024-01-24 MED ORDER — CHLORHEXIDINE GLUCONATE 0.12 % MT SOLN: 15.0000 mL | Freq: Once | OROMUCOSAL | Status: AC

## 2024-01-24 MED ORDER — AMISULPRIDE (ANTIEMETIC) 5 MG/2ML IV SOLN: 10.0000 mg | Freq: Once | INTRAVENOUS | Status: DC | PRN

## 2024-01-24 MED ORDER — ONDANSETRON HCL 4 MG/2ML IJ SOLN: 4.0000 mg | Freq: Once | INTRAMUSCULAR | Status: DC | PRN

## 2024-01-24 SURGICAL SUPPLY — 38 items
BAG COUNTER SPONGE SURGICOUNT (BAG) ×1 IMPLANT
CANISTER SUCT 3000ML PPV (MISCELLANEOUS) IMPLANT
COVER SURGICAL LIGHT HANDLE (MISCELLANEOUS) ×1 IMPLANT
DERMABOND ADVANCED .7 DNX12 (GAUZE/BANDAGES/DRESSINGS) ×1 IMPLANT
DISSECTOR BLUNT TIP ENDO 5MM (MISCELLANEOUS) IMPLANT
ELECT REM PT RETURN 9FT ADLT (ELECTROSURGICAL) ×1 IMPLANT
ELECTRODE REM PT RTRN 9FT ADLT (ELECTROSURGICAL) ×1 IMPLANT
ENDOLOOP SUT PDS II 0 18 (SUTURE) IMPLANT
GLOVE BIO SURGEON STRL SZ7.5 (GLOVE) ×2 IMPLANT
GOWN STRL REUS W/ TWL LRG LVL3 (GOWN DISPOSABLE) ×2 IMPLANT
GOWN STRL REUS W/ TWL XL LVL3 (GOWN DISPOSABLE) ×1 IMPLANT
IRRIG SUCT STRYKERFLOW 2 WTIP (MISCELLANEOUS) IMPLANT
IRRIGATION SUCT STRKRFLW 2 WTP (MISCELLANEOUS) IMPLANT
KIT BASIN OR (CUSTOM PROCEDURE TRAY) ×1 IMPLANT
KIT TURNOVER KIT B (KITS) ×1 IMPLANT
MESH 3DMAX MID 5X7 LT XLRG (Mesh General) IMPLANT
MESH 3DMAX MID 5X7 RT XLRG (Mesh General) IMPLANT
NDL INSUFFLATION 14GA 120MM (NEEDLE) IMPLANT
NEEDLE INSUFFLATION 14GA 120MM (NEEDLE) IMPLANT
NS IRRIG 1000ML POUR BTL (IV SOLUTION) ×1 IMPLANT
PAD ARMBOARD 7.5X6 YLW CONV (MISCELLANEOUS) ×2 IMPLANT
RELOAD STAPLE 4.0 BLU F/HERNIA (INSTRUMENTS) IMPLANT
RELOAD STAPLE 4.8 BLK F/HERNIA (STAPLE) IMPLANT
RELOAD STAPLE HERNIA 4.0 BLUE (INSTRUMENTS) ×1 IMPLANT
RELOAD STAPLE HERNIA 4.8 BLK (STAPLE) IMPLANT
SCISSORS LAP 5X35 DISP (ENDOMECHANICALS) ×1 IMPLANT
SET TUBE SMOKE EVAC HIGH FLOW (TUBING) ×1 IMPLANT
STAPLER HERNIA 12 8.5 360D (INSTRUMENTS) IMPLANT
SUT MNCRL AB 4-0 PS2 18 (SUTURE) ×1 IMPLANT
SUT VIC AB 1 CT1 27XBRD ANBCTR (SUTURE) IMPLANT
TOWEL GREEN STERILE (TOWEL DISPOSABLE) ×1 IMPLANT
TOWEL GREEN STERILE FF (TOWEL DISPOSABLE) ×1 IMPLANT
TRAY LAPAROSCOPIC MC (CUSTOM PROCEDURE TRAY) ×1 IMPLANT
TROCAR OPTICAL SHORT 5MM (TROCAR) ×1 IMPLANT
TROCAR OPTICAL SLV SHORT 5MM (TROCAR) ×1 IMPLANT
TROCAR Z THREAD OPTICAL 12X100 (TROCAR) ×1 IMPLANT
WARMER LAPAROSCOPE (MISCELLANEOUS) ×1 IMPLANT
WATER STERILE IRR 1000ML POUR (IV SOLUTION) ×1 IMPLANT

## 2024-01-24 NOTE — Transfer of Care (Signed)
 Immediate Anesthesia Transfer of Care Note  Patient: Trevor Wallace.  Procedure(s) Performed: LAPAROSCOPIC BILATERAL INGUINAL HERNIA REPAIR WITH MESH (Bilateral: Abdomen)  Patient Location: PACU  Anesthesia Type:General  Level of Consciousness: awake, alert , and oriented  Airway & Oxygen Therapy: Patient Spontanous Breathing and Patient connected to face mask oxygen  Post-op Assessment: Report given to RN and Post -op Vital signs reviewed and stable  Post vital signs: Reviewed and stable  Last Vitals:  Vitals Value Taken Time  BP 155/88 01/24/24 1102  Temp    Pulse 79 01/24/24 1104  Resp 18 01/24/24 1103  SpO2 99 % 01/24/24 1104  Vitals shown include unfiled device data.  Last Pain:  Vitals:   01/24/24 0930  TempSrc:   PainSc: 9       Patients Stated Pain Goal: 3 (01/24/24 0930)  Complications: No notable events documented.

## 2024-01-24 NOTE — Anesthesia Postprocedure Evaluation (Signed)
 Anesthesia Post Note  Patient: Trevor Wallace.  Procedure(s) Performed: LAPAROSCOPIC BILATERAL INGUINAL HERNIA REPAIR WITH MESH (Bilateral: Abdomen)     Patient location during evaluation: PACU Anesthesia Type: General Level of consciousness: awake and alert, oriented and patient cooperative Pain management: pain level controlled Vital Signs Assessment: post-procedure vital signs reviewed and stable Respiratory status: spontaneous breathing, nonlabored ventilation and respiratory function stable Cardiovascular status: blood pressure returned to baseline and stable Postop Assessment: no apparent nausea or vomiting Anesthetic complications: no   No notable events documented.  Last Vitals:  Vitals:   01/24/24 1215 01/24/24 1227  BP: 127/86 132/88  Pulse: 69 64  Resp: (!) 21 16  Temp:  36.4 C  SpO2: 97% 98%                  [name]

## 2024-01-24 NOTE — Op Note (Signed)
 01/24/2024  10:50 AM  PATIENT:  Katherina Right.  48 y.o. male  PRE-OPERATIVE DIAGNOSIS:  Bilateral Inguinal Hernia  POST-OPERATIVE DIAGNOSIS:  Bilateral Inguinal Hernia, Direct  PROCEDURE:  Procedure(s): LAPAROSCOPIC BILATERAL INGUINAL HERNIA REPAIR WITH MESH (Bilateral)  SURGEON:  Surgeons and Role:    Axel Filler, MD - Primary  ASSISTANTS: Gevena Cotton, RN   ANESTHESIA:   local and general  EBL:  10 mL   BLOOD ADMINISTERED:none  DRAINS: none   LOCAL MEDICATIONS USED:  BUPIVICAINE   SPECIMEN:  No Specimen  DISPOSITION OF SPECIMEN:  N/A  COUNTS:  YES  TOURNIQUET:  * No tourniquets in log *  DICTATION: .Dragon Dictation   Counts: reported as correct x 2  Findings:  The patient had a medium bilateral direct hernias  Indications for procedure:  The patient is a 48 year old male with a bilateral hernias for several months. Patient complained of symptomatology to his bilateral inguinal areas. The patient was taken back for elective inguinal hernia repair.  Details of the procedure:   The patient was taken back to the operating room. The patient was placed in supine position with bilateral SCDs in place.  The patient underwent GETA.  The patient was prepped and draped in the usual sterile fashion.  After appropriate anitbiotics were confirmed, a time-out was confirmed and all facts were verified.  0.25% Marcaine was used to infiltrate the umbilical area. A 11-blade was used to cut down the skin and blunt dissection was used to get the anterior fashion.  The anterior fascia was incised approximately 1 cm and the muscles were retracted laterally. Blunt dissection was then used to create a space in the preperitoneal area. At this time a 10 mm camera was then introduced into the space and advanced the pubic tubercle and a 12 mm trocar was placed over this and insufflation was started.   At this time and space was created from medial to laterally the  preperitoneal space.  Cooper's ligament was initially cleaned off. A direct hernia was seen.  The preperitoneal fat was dissected away and the transversalis fascia spontaneously reduced.  The spermatic cord was identified and dissected away from the cremesteric muscle fibers.  This was circumferentially dissected. Dissection of the peritoneum was undertaken the vas deferens was identified and protected in all parts of the case.  The peritoneum was dissected back to approximate level of the umbilicus bilaterally.   At this time and space was created from medial to laterally the preperitoneal space on the right side.  Cooper's ligament was initially cleaned off. A direct hernia was seen.  The preperitoneal fat was dissected away and the transversalis fascia spontaneously reduced.  The spermatic cord was identified and dissected away from the cremesteric muscle fibers.  This was circumferentially dissected. Dissection of the peritoneum was undertaken the vas deferens was identified and protected in all parts of the case.  The peritoneum was dissected back to approximate level of the umbilicus bilaterally.  A Bard 3D MID mesh, size: XLarge, was  introduced into the preperitoneal space.  The mesh was brought over to cover the direct and indirect and femoral hernia spaces.  This was anchored into place and secured to Cooper's ligament with 4.64mm staples from a Coviden hernia stapler. It was anchored to the anterior abdominal wall with 4.8 mm staples. The hernia sac was seen lying posterior to the mesh. There was no staples placed laterally.   The insufflation was evacuated and the peritoneum was seen posterior  to the mesh bilaterally. The trochars were removed. The anterior fascia was reapproximated using #1 Vicryl on a UR- 6.  Intra-abdominal air was evacuated and the Veress needle removed. The skin was reapproximated using 4-0 Monocryl subcuticular fashion and was dressed with Dermabond. The  patient was awakened  from general anesthesia and taken to recovery in stable condition.   PLAN OF CARE: Discharge to home after PACU  PATIENT DISPOSITION:  PACU - hemodynamically stable.   Delay start of Pharmacological VTE agent (>24hrs) due to surgical blood loss or risk of bleeding: not applicable

## 2024-01-24 NOTE — H&P (Signed)
  Chief Complaint  Patient presents with  New Consultation   Subjective   HPI  Demetrious is a 48 y.o. male who presents for New Consultation History of Present Illness The patient, a 48 year old male, presents with a 6-7 month history of sharp pain in the groin area, which he attributes to strenuous work and exercise. The pain has been persistent and is now noticeable even when sitting. He reports a bulge on the left side of his groin, which he believes is starting to appear on the right side as well. The patient has not had any previous abdominal surgeries. He was prediabetic but has managed to reverse this condition through lifestyle changes. He is not currently on any medications and has no known allergies.  Review of Systems  There is no problem list on file for this patient.  No outpatient medications prior to visit.   No facility-administered medications prior to visit.     Objective   Vitals:  11/30/23 1105 11/30/23 1106  BP: 114/80  Pulse: 76  Temp: 37 C (98.6 F)  SpO2: 97%  Weight: 100.1 kg (220 lb 9.6 oz)  Height: 175.3 cm (5\' 9" )  PainSc: 9  PainLoc: Abdomen   Body mass index is 32.58 kg/m.  Home Vitals:   Physical Exam Physical Exam PE:  Constitutional: No acute distress, conversant, appears states age. Eyes: Anicteric sclerae, moist conjunctiva, no lid lag Lungs: Clear to auscultation bilaterally, normal respiratory effort CV: regular rate and rhythm, no murmurs, no peripheral edema, pedal pulses 2+ GI: Soft, no masses or hepatosplenomegaly, non-tender to palpation Skin: No rashes, palpation reveals normal turgor Psychiatric: appropriate judgment and insight, oriented to person, place, and time  GENITOURINARY: Small hernia on the right side, palpable bulge, no pain elicited upon coughing. Large hernia on the left side, palpable bulge,   Assessment/Plan:   Assessment & Plan Bilateral Inguinal Hernias  Chronic pain and bulging have persisted for  6-7 months, worsening with physical activity and work. There is no history of abdominal surgeries. The risks and benefits of laparoscopic repair, including infection, bleeding, damage to surrounding structures, and recurrence, were discussed against conservative management, which carries risks of incarceration and strangulation. Plan for laparoscopic bilateral inguinal hernia repair. Post-operative restrictions include no lifting over 20 pounds for 6 weeks. Provide documentation for a court case indicating planned surgery. Continue work as tolerated until surgery.  Diagnoses and all orders for this visit:  Bilateral inguinal hernia without obstruction or gangrene, recurrence not specified

## 2024-01-24 NOTE — Anesthesia Procedure Notes (Signed)
 Procedure Name: Intubation Date/Time: 01/24/2024 9:52 AM  Performed by: Sudie Grumbling, CRNAPre-anesthesia Checklist: Patient identified, Emergency Drugs available, Suction available and Patient being monitored Patient Re-evaluated:Patient Re-evaluated prior to induction Oxygen Delivery Method: Circle system utilized Preoxygenation: Pre-oxygenation with 100% oxygen Induction Type: IV induction Ventilation: Mask ventilation without difficulty Laryngoscope Size: Mac and 4 Grade View: Grade II Tube type: Oral Tube size: 7.5 mm Number of attempts: 1 Airway Equipment and Method: Stylet and Oral airway Placement Confirmation: ETT inserted through vocal cords under direct vision, positive ETCO2 and breath sounds checked- equal and bilateral Secured at: 22 cm Tube secured with: Tape Dental Injury: Teeth and Oropharynx as per pre-operative assessment

## 2024-01-24 NOTE — Discharge Instructions (Signed)

## 2024-01-25 ENCOUNTER — Encounter (HOSPITAL_COMMUNITY): Payer: Self-pay | Admitting: General Surgery

## 2024-03-21 ENCOUNTER — Encounter: Payer: Self-pay | Admitting: Physical Therapy

## 2024-03-21 ENCOUNTER — Ambulatory Visit: Admitting: Physical Therapy

## 2024-03-21 ENCOUNTER — Other Ambulatory Visit: Payer: Self-pay

## 2024-03-21 DIAGNOSIS — M62838 Other muscle spasm: Secondary | ICD-10-CM | POA: Insufficient documentation

## 2024-03-21 DIAGNOSIS — M542 Cervicalgia: Secondary | ICD-10-CM | POA: Diagnosis present

## 2024-03-21 DIAGNOSIS — M6281 Muscle weakness (generalized): Secondary | ICD-10-CM | POA: Diagnosis present

## 2024-03-21 DIAGNOSIS — M5459 Other low back pain: Secondary | ICD-10-CM | POA: Diagnosis present
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
## 2024-04-04 ENCOUNTER — Ambulatory Visit

## 2024-04-04 DIAGNOSIS — M6281 Muscle weakness (generalized): Secondary | ICD-10-CM

## 2024-04-04 DIAGNOSIS — M5459 Other low back pain: Secondary | ICD-10-CM

## 2024-04-04 DIAGNOSIS — M62838 Other muscle spasm: Secondary | ICD-10-CM

## 2024-04-04 DIAGNOSIS — M542 Cervicalgia: Secondary | ICD-10-CM | POA: Diagnosis not present
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
## 2024-04-09 ENCOUNTER — Ambulatory Visit

## 2024-04-09 DIAGNOSIS — M6281 Muscle weakness (generalized): Secondary | ICD-10-CM

## 2024-04-09 DIAGNOSIS — M542 Cervicalgia: Secondary | ICD-10-CM | POA: Diagnosis not present
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
## 2024-04-11 ENCOUNTER — Ambulatory Visit (HOSPITAL_COMMUNITY): Admission: EM | Admit: 2024-04-11 | Discharge: 2024-04-11 | Disposition: A | Discharge status: Home / Self Care

## 2024-04-11 ENCOUNTER — Encounter: Payer: Self-pay | Admitting: Physical Therapy

## 2024-04-11 ENCOUNTER — Ambulatory Visit: Admitting: Physical Therapy

## 2024-04-11 ENCOUNTER — Encounter (HOSPITAL_COMMUNITY): Payer: Self-pay

## 2024-04-11 DIAGNOSIS — R350 Frequency of micturition: Secondary | ICD-10-CM

## 2024-04-11 DIAGNOSIS — E119 Type 2 diabetes mellitus without complications: Secondary | ICD-10-CM | POA: Diagnosis not present

## 2024-04-11 DIAGNOSIS — M5459 Other low back pain: Secondary | ICD-10-CM

## 2024-04-11 DIAGNOSIS — M542 Cervicalgia: Secondary | ICD-10-CM

## 2024-04-11 DIAGNOSIS — G8929 Other chronic pain: Secondary | ICD-10-CM | POA: Diagnosis present

## 2024-04-11 DIAGNOSIS — E785 Hyperlipidemia, unspecified: Secondary | ICD-10-CM | POA: Diagnosis not present

## 2024-04-11 DIAGNOSIS — M6281 Muscle weakness (generalized): Secondary | ICD-10-CM

## 2024-04-11 DIAGNOSIS — Z7985 Long-term (current) use of injectable non-insulin antidiabetic drugs: Secondary | ICD-10-CM | POA: Insufficient documentation

## 2024-04-11 DIAGNOSIS — M5441 Lumbago with sciatica, right side: Secondary | ICD-10-CM | POA: Diagnosis present

## 2024-04-11 DIAGNOSIS — M62838 Other muscle spasm: Secondary | ICD-10-CM

## 2024-04-11 LAB — POCT URINALYSIS DIP (MANUAL ENTRY)
Bilirubin, UA: NEGATIVE
Blood, UA: NEGATIVE
Glucose, UA: NEGATIVE mg/dL
Ketones, POC UA: NEGATIVE mg/dL
Leukocytes, UA: NEGATIVE
Nitrite, UA: NEGATIVE
Protein Ur, POC: 30 mg/dL — AB
Spec Grav, UA: >=1.03 — AB (ref 1.010–1.025)
Urobilinogen, UA: 0.2 U/dL
pH, UA: 6 (ref 5.0–8.0)

## 2024-04-11 LAB — POCT FASTING CBG KUC MANUAL ENTRY: POCT Glucose (KUC): 114 mg/dL — AB (ref 70–99)

## 2024-04-11 MED ORDER — DEXAMETHASONE SODIUM PHOSPHATE 10 MG/ML IJ SOLN
10.0000 mg | Freq: Once | INTRAMUSCULAR | Status: AC
  Administered 2024-04-11: 10 mg via INTRAMUSCULAR

## 2024-04-11 MED ORDER — NAPROXEN 500 MG PO TABS: 500.0000 mg | ORAL_TABLET | Freq: Two times a day (BID) | ORAL | 0 refills | Status: AC

## 2024-04-11 MED ORDER — LIDOCAINE 4 % EX PTCH: 1.0000 | MEDICATED_PATCH | CUTANEOUS | 0 refills | Status: AC

## 2024-04-11 MED ORDER — DEXAMETHASONE SODIUM PHOSPHATE 10 MG/ML IJ SOLN
INTRAMUSCULAR | Status: AC
  Filled 2024-04-11: qty 1

## 2024-04-11 NOTE — ED Provider Notes (Signed)
 [facility]    CSN: [id number] Arrival date & time: 04/11/24  0815      History   Chief Complaint Chief Complaint  Patient presents with   Flank Pain    HPI Trevor Wallace. is a 48 y.o. male.   Patient presents with right lower back pain that began yesterday.  Patient reports a intermittent stabbing pain to his right low back with tingling and pain that radiates down his right leg.  Patient also reports urinary frequency for the last month. Denies dysuria, hematuria, abdominal pain, and fever. Denies any recent falls or injuries.  Patient denies taking any medication for his symptoms.  Patient has history of right-sided low back with sciatica. Patient has a history of diabetes and reports that he is currently on Ozempic for this, hyperlipidemia, and inguinal hernia repair in 01/2024.  Patient currently goes to physical therapy for chronic neck pain. Patient is scheduled to go to physical therapy today.   The history is provided by the patient and medical records.  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   Chest pain 08/30/2022   Midline low back pain with right-sided sciatica 08/30/2022   Abdominal pain 01/16/2022   Right sided abdominal pain 01/16/2022   Right shoulder pain 10/15/2021   Vitamin D  deficiency 08/13/2021   HLD (hyperlipidemia) 08/13/2021   Migraine 08/13/2021   Cervical myelopathy (HCC) 06/09/2021   Gait disorder 06/09/2021   RLS (restless legs syndrome) 06/09/2021   COVID-19 virus infection 10/27/2020   Urinary  frequency 04/10/2020   Elevated LFTs 09/27/2019   Encounter for well adult exam with abnormal findings 02/15/2017   Former smoker 02/15/2017   Dysuria 02/15/2017   Diabetes (HCC)     Past Surgical History:  Procedure Laterality Date   INGUINAL HERNIA REPAIR Bilateral 01/24/2024   Procedure: LAPAROSCOPIC BILATERAL INGUINAL HERNIA REPAIR WITH MESH;  Surgeon: Shela Derby, MD;  Location: [facility] OR;  Service: General;  Laterality: Bilateral;   NO PAST SURGERIES         Home Medications    Prior to Admission medications   Medication Sig Start Date End Date Taking? Authorizing Provider  atorvastatin (LIPITOR) 20 MG tablet Take 20 mg by mouth daily. 01/21/24  Yes [provider]  baclofen (LIORESAL) 10 MG tablet Take 10 mg by mouth 3 (three) times daily. 04/03/24  Yes [provider]  lidocaine  4 % Place 1 patch onto the skin daily. 04/11/24  Yes Rosevelt Constable, Naijah Lacek A, NP  naproxen  (NAPROSYN ) 500 MG tablet Take 1 tablet (500 mg total) by mouth 2 (two) times daily. 04/11/24  Yes Levora Reas A, NP  traMADol  (ULTRAM ) 50 MG tablet Take 1 tablet (50 mg total) by mouth every 6 (six) hours as needed. 03/22/23  Yes Roslyn Coombe, MD  traMADol  (ULTRAM ) 50 MG tablet Take 1 tablet (50 mg total) by mouth every 6 (six) hours as needed. 01/24/24 01/23/25 Yes Shela Derby, MD    Family History Family History  Problem Relation Age of Onset   Healthy  Mother    Cancer Father    Diabetes Other     Social History Social History   Tobacco Use   Smoking status: Former    Types: Cigarettes   Smokeless tobacco: Never  Vaping Use   Vaping status: Never Used  Substance Use Topics   Alcohol use: No    Alcohol/week: 0.0 standard drinks of alcohol   Drug use: Not Currently    Types: Cocaine    Comment: last used in 2021     Allergies   Metformin  and related   Review of Systems Review of Systems  Genitourinary:  Positive for flank pain.   Per HPI  Physical Exam Triage  Vital Signs ED Triage Vitals  Encounter Vitals Group     BP 04/11/24 0845 110/76     Systolic BP Percentile --      Diastolic BP Percentile --      Pulse Rate 04/11/24 0845 83     Resp 04/11/24 0845 16     Temp 04/11/24 0845 (!) 97.4 F (36.3 C)     Temp Source 04/11/24 0845 Oral     SpO2 04/11/24 0845 96 %     Weight --      Height --      Head Circumference --      Peak Flow --      Pain Score 04/11/24 0843 10     Pain Loc --      Pain Education --      Exclude from Growth Chart --    No data found.  Updated Vital Signs BP 110/76 (BP Location: Right Arm)   Pulse 83   Temp (!) 97.4 F (36.3 C) (Oral)   Resp 16   SpO2 96%   Visual Acuity Right Eye Distance:   Left Eye Distance:   Bilateral Distance:    Right Eye Near:   Left Eye Near:    Bilateral Near:     Physical Exam Vitals and nursing note reviewed.  Constitutional:      General: He is awake. He is not in acute distress.    Appearance: Normal appearance. He is well-developed and well-groomed. He is not ill-appearing.  Abdominal:     General: Abdomen is flat. Bowel sounds are normal. There is no distension.     Palpations: Abdomen is soft.     Tenderness: There is no abdominal tenderness. There is no right CVA tenderness, left CVA tenderness, guarding or rebound.  Musculoskeletal:     Thoracic back: Normal.     Lumbar back: Tenderness present. No swelling, deformity, signs of trauma or bony tenderness. Normal range of motion. Positive right straight leg raise test. Negative left straight leg raise test.  Skin:    General: Skin is warm and dry.  Neurological:     Mental Status: He is alert.  Psychiatric:        Behavior: Behavior is cooperative.      UC Treatments / Results  Labs (all labs ordered are listed, but only abnormal results are displayed) Labs Reviewed  POCT URINALYSIS DIP (MANUAL ENTRY) - Abnormal; Notable for the following components:      Result Value   Spec Grav, UA >=1.030 (*)     Protein Ur, POC =30 (*)    All other components within normal limits  POCT FASTING CBG [facility] MANUAL ENTRY - Abnormal; Notable for the following components:   POCT Glucose ([facility]) 114 (*)    All other components within normal limits  URINE CULTURE    EKG   Radiology No results found.  Procedures Procedures (including critical care time)  Medications Ordered in UC Medications  dexamethasone  (DECADRON ) injection 10 mg (10 mg Intramuscular Given 04/11/24 0927)    Initial Impression / Assessment and Plan / UC Course  I have reviewed the triage vital signs and the nursing notes.  Pertinent labs & imaging results that were available during my care of the patient were reviewed by me and considered in my medical decision making (see chart for details).     Patient is well-appearing.  Vitals are stable.  Upon assessment there is tenderness noted to right low back with positive right straight leg raise test.  CVA tenderness negative.  Urinalysis unremarkable for any signs of infection or presence of possible kidney stone, will send culture to confirm.  Nonfasting CBG within normal limits.  Back pain likely related to history of right-sided low back pain with sciatica.  Given IM Decadron  in clinic for inflammation related to pain.  Prescribed naproxen  and lidocaine  patches for additional pain relief.  Given orthopedic follow-up.  Discussed follow-up and return precautions. Final Clinical Impressions(s) / UC Diagnoses   Final diagnoses:  Chronic right-sided low back pain with right-sided sciatica  Urinary frequency     Discharge Instructions      You received an injection of Decadron  here today which is a steroid to help with inflammation causing your pain. Take naproxen  twice daily as needed for pain. Do not take this with other NSAIDs including ibuprofen , motrin , Advil , Aleve . You can take 650 mg of Tylenol  every 6-8 hours as needed for breakthrough pain. Apply lidocaine  patch for 12  hours at a time with additional pain relief.  Alternate between heat and ice and do some gentle stretching to help with pain. I have attached [facility] that you can follow-up with if needed, or you can discuss your back pain with physical therapy today. Follow-up with your primary care provider regarding concerns for urinary frequency as this could be related to your diabetes.  Return here as needed.    ED Prescriptions     Medication Sig Dispense Auth. Provider   lidocaine  4 % Place 1 patch onto the skin daily. 20 patch Levora Reas A, NP   naproxen  (NAPROSYN ) 500 MG tablet Take 1 tablet (500 mg total) by mouth 2 (two) times daily. 30 tablet Levora Reas A, NP      PDMP not reviewed this encounter.   Levora Reas A, NP 04/11/24 0930
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
## 2024-04-11 NOTE — Discharge Instructions (Addendum)
 You received an injection of Decadron  here today which is a steroid to help with inflammation causing your pain. Take naproxen  twice daily as needed for pain. Do not take this with other NSAIDs including ibuprofen , motrin , Advil , Aleve . You can take 650 mg of Tylenol  every 6-8 hours as needed for breakthrough pain. Apply lidocaine  patch for 12 hours at a time with additional pain relief.  Alternate between heat and ice and do some gentle stretching to help with pain. I have attached [facility] that you can follow-up with if needed, or you can discuss your back pain with physical therapy today. Follow-up with your primary care provider regarding concerns for urinary frequency as this could be related to your diabetes.  Return here as needed.

## 2024-04-11 NOTE — ED Triage Notes (Signed)
 Patient presenting with right flank pain, urinary frequency, stabbing pain onset yesterday. No known falls or injuries.  Prescriptions or OTC medications tried: No

## 2024-04-12 ENCOUNTER — Ambulatory Visit: Payer: Self-pay

## 2024-04-12 LAB — URINE CULTURE: Culture: NO GROWTH

## 2024-04-16 ENCOUNTER — Ambulatory Visit: Admitting: Physical Therapy

## 2024-04-16 DIAGNOSIS — M5459 Other low back pain: Secondary | ICD-10-CM | POA: Insufficient documentation

## 2024-04-16 DIAGNOSIS — M62838 Other muscle spasm: Secondary | ICD-10-CM | POA: Insufficient documentation

## 2024-04-16 DIAGNOSIS — M542 Cervicalgia: Secondary | ICD-10-CM | POA: Insufficient documentation

## 2024-04-16 DIAGNOSIS — M6281 Muscle weakness (generalized): Secondary | ICD-10-CM | POA: Insufficient documentation

## 2024-04-18 ENCOUNTER — Ambulatory Visit

## 2024-04-18 DIAGNOSIS — M5459 Other low back pain: Secondary | ICD-10-CM | POA: Diagnosis present

## 2024-04-18 DIAGNOSIS — M62838 Other muscle spasm: Secondary | ICD-10-CM | POA: Diagnosis present

## 2024-04-18 DIAGNOSIS — M542 Cervicalgia: Secondary | ICD-10-CM

## 2024-04-18 DIAGNOSIS — M6281 Muscle weakness (generalized): Secondary | ICD-10-CM | POA: Diagnosis present
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
## 2024-04-23 ENCOUNTER — Encounter: Payer: Self-pay | Admitting: Physical Therapy

## 2024-04-23 ENCOUNTER — Ambulatory Visit: Admitting: Physical Therapy

## 2024-04-23 ENCOUNTER — Ambulatory Visit: Payer: Self-pay | Admitting: Physical Therapy

## 2024-04-23 DIAGNOSIS — M6281 Muscle weakness (generalized): Secondary | ICD-10-CM

## 2024-04-23 DIAGNOSIS — M5459 Other low back pain: Secondary | ICD-10-CM

## 2024-04-23 DIAGNOSIS — M62838 Other muscle spasm: Secondary | ICD-10-CM

## 2024-04-23 DIAGNOSIS — M542 Cervicalgia: Secondary | ICD-10-CM | POA: Diagnosis not present
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
## 2024-04-25 ENCOUNTER — Encounter: Payer: Self-pay | Admitting: Physical Therapy

## 2024-04-25 ENCOUNTER — Ambulatory Visit: Payer: Self-pay | Admitting: Physical Therapy

## 2024-04-25 DIAGNOSIS — M6281 Muscle weakness (generalized): Secondary | ICD-10-CM

## 2024-04-25 DIAGNOSIS — M5459 Other low back pain: Secondary | ICD-10-CM

## 2024-04-25 DIAGNOSIS — M542 Cervicalgia: Secondary | ICD-10-CM

## 2024-04-25 DIAGNOSIS — M62838 Other muscle spasm: Secondary | ICD-10-CM
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
## 2024-04-30 ENCOUNTER — Ambulatory Visit: Payer: Self-pay | Admitting: Physical Therapy

## 2024-05-02 ENCOUNTER — Ambulatory Visit: Payer: Self-pay

## 2024-05-02 DIAGNOSIS — M6281 Muscle weakness (generalized): Secondary | ICD-10-CM

## 2024-05-02 DIAGNOSIS — M542 Cervicalgia: Secondary | ICD-10-CM | POA: Diagnosis not present

## 2024-05-02 DIAGNOSIS — M5459 Other low back pain: Secondary | ICD-10-CM
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
## 2024-05-21 ENCOUNTER — Ambulatory Visit

## 2024-05-23 ENCOUNTER — Ambulatory Visit: Admitting: Physical Therapy

## 2024-05-28 ENCOUNTER — Encounter: Admitting: Physical Therapy

## 2024-05-30 ENCOUNTER — Encounter: Admitting: Physical Therapy

## 2024-06-04 ENCOUNTER — Ambulatory Visit

## 2024-06-04 ENCOUNTER — Telehealth: Payer: Self-pay

## 2024-06-04 NOTE — Telephone Encounter (Signed)
 PT called and left voicemail for patient regarding missed visit. This is multiple late cancels/no-shows and patient will be discharged at this time.   Alm JAYSON Kingdom   06/04/24 4:50 PM
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
